# Patient Record
Sex: Female | Born: 1940
Health system: Southern US, Community
[De-identification: ages and names within clinical notes are randomized; demographics above are authoritative.]

## PROBLEM LIST (undated history)

## (undated) DIAGNOSIS — M8580 Other specified disorders of bone density and structure, unspecified site: Secondary | ICD-10-CM

## (undated) DIAGNOSIS — Z923 Personal history of irradiation: Secondary | ICD-10-CM

## (undated) DIAGNOSIS — E119 Type 2 diabetes mellitus without complications: Secondary | ICD-10-CM

## (undated) DIAGNOSIS — E785 Hyperlipidemia, unspecified: Secondary | ICD-10-CM

## (undated) DIAGNOSIS — G4733 Obstructive sleep apnea (adult) (pediatric): Principal | ICD-10-CM

## (undated) DIAGNOSIS — I2510 Atherosclerotic heart disease of native coronary artery without angina pectoris: Secondary | ICD-10-CM

## (undated) DIAGNOSIS — F32A Depression, unspecified: Secondary | ICD-10-CM

## (undated) DIAGNOSIS — I739 Peripheral vascular disease, unspecified: Secondary | ICD-10-CM

## (undated) DIAGNOSIS — F329 Major depressive disorder, single episode, unspecified: Secondary | ICD-10-CM

## (undated) DIAGNOSIS — I10 Essential (primary) hypertension: Secondary | ICD-10-CM

## (undated) DIAGNOSIS — C50919 Malignant neoplasm of unspecified site of unspecified female breast: Secondary | ICD-10-CM

## (undated) DIAGNOSIS — J45909 Unspecified asthma, uncomplicated: Secondary | ICD-10-CM

## (undated) HISTORY — PX: CYST REMOVAL NECK: SHX6281

## (undated) HISTORY — DX: Peripheral vascular disease, unspecified: I73.9

## (undated) HISTORY — DX: Essential (primary) hypertension: I10

## (undated) HISTORY — DX: Obstructive sleep apnea (adult) (pediatric): G47.33

## (undated) HISTORY — PX: BREAST LUMPECTOMY: SHX2

## (undated) HISTORY — DX: Unspecified asthma, uncomplicated: J45.909

## (undated) HISTORY — DX: Major depressive disorder, single episode, unspecified: F32.9

## (undated) HISTORY — DX: Hyperlipidemia, unspecified: E78.5

## (undated) HISTORY — DX: Malignant neoplasm of unspecified site of unspecified female breast: C50.919

## (undated) HISTORY — DX: Type 2 diabetes mellitus without complications: E11.9

## (undated) HISTORY — PX: CHOLECYSTECTOMY: SHX55

## (undated) HISTORY — PX: APPENDECTOMY: SHX54

## (undated) HISTORY — DX: Other specified disorders of bone density and structure, unspecified site: M85.80

## (undated) HISTORY — DX: Atherosclerotic heart disease of native coronary artery without angina pectoris: I25.10

## (undated) HISTORY — DX: Depression, unspecified: F32.A

---

## 1999-08-18 ENCOUNTER — Encounter: Admission: RE | Admit: 1999-08-18 | Discharge: 1999-08-18 | Payer: Self-pay | Admitting: Family Medicine

## 1999-08-18 ENCOUNTER — Encounter: Payer: Self-pay | Admitting: Family Medicine

## 2000-09-27 ENCOUNTER — Encounter: Payer: Self-pay | Admitting: Family Medicine

## 2000-09-27 ENCOUNTER — Encounter: Admission: RE | Admit: 2000-09-27 | Discharge: 2000-09-27 | Payer: Self-pay | Admitting: Family Medicine

## 2001-11-16 ENCOUNTER — Emergency Department (HOSPITAL_COMMUNITY): Admission: EM | Admit: 2001-11-16 | Discharge: 2001-11-16 | Payer: Self-pay | Admitting: Emergency Medicine

## 2002-01-29 ENCOUNTER — Encounter: Admission: RE | Admit: 2002-01-29 | Discharge: 2002-01-29 | Payer: Self-pay | Admitting: Family Medicine

## 2002-02-06 ENCOUNTER — Encounter: Payer: Self-pay | Admitting: Family Medicine

## 2002-02-06 ENCOUNTER — Encounter: Admission: RE | Admit: 2002-02-06 | Discharge: 2002-02-06 | Payer: Self-pay | Admitting: Family Medicine

## 2002-09-06 ENCOUNTER — Encounter: Payer: Self-pay | Admitting: Family Medicine

## 2002-09-06 ENCOUNTER — Encounter: Admission: RE | Admit: 2002-09-06 | Discharge: 2002-09-06 | Payer: Self-pay | Admitting: Family Medicine

## 2002-09-26 ENCOUNTER — Other Ambulatory Visit: Admission: RE | Admit: 2002-09-26 | Discharge: 2002-09-26 | Payer: Self-pay | Admitting: Family Medicine

## 2002-10-03 ENCOUNTER — Encounter (INDEPENDENT_AMBULATORY_CARE_PROVIDER_SITE_OTHER): Payer: Self-pay | Admitting: Specialist

## 2002-10-03 ENCOUNTER — Ambulatory Visit (HOSPITAL_COMMUNITY): Admission: RE | Admit: 2002-10-03 | Discharge: 2002-10-03 | Payer: Self-pay | Admitting: Gastroenterology

## 2003-10-23 ENCOUNTER — Inpatient Hospital Stay (HOSPITAL_COMMUNITY): Admission: EM | Admit: 2003-10-23 | Discharge: 2003-10-29 | Payer: Self-pay | Admitting: Emergency Medicine

## 2004-02-17 ENCOUNTER — Other Ambulatory Visit: Admission: RE | Admit: 2004-02-17 | Discharge: 2004-02-17 | Payer: Self-pay | Admitting: Family Medicine

## 2004-02-26 ENCOUNTER — Encounter: Admission: RE | Admit: 2004-02-26 | Discharge: 2004-02-26 | Payer: Self-pay | Admitting: Family Medicine

## 2005-01-11 ENCOUNTER — Encounter (HOSPITAL_COMMUNITY): Admission: RE | Admit: 2005-01-11 | Discharge: 2005-04-11 | Payer: Self-pay | Admitting: Cardiology

## 2005-02-19 ENCOUNTER — Emergency Department (HOSPITAL_COMMUNITY): Admission: EM | Admit: 2005-02-19 | Discharge: 2005-02-20 | Payer: Self-pay | Admitting: Emergency Medicine

## 2005-06-21 ENCOUNTER — Ambulatory Visit: Payer: Self-pay | Admitting: Internal Medicine

## 2005-08-02 ENCOUNTER — Ambulatory Visit: Payer: Self-pay | Admitting: Internal Medicine

## 2005-08-04 ENCOUNTER — Ambulatory Visit (HOSPITAL_COMMUNITY): Admission: RE | Admit: 2005-08-04 | Discharge: 2005-08-04 | Payer: Self-pay | Admitting: Internal Medicine

## 2005-08-16 ENCOUNTER — Encounter: Admission: RE | Admit: 2005-08-16 | Discharge: 2005-08-16 | Payer: Self-pay | Admitting: General Surgery

## 2005-08-23 ENCOUNTER — Encounter (INDEPENDENT_AMBULATORY_CARE_PROVIDER_SITE_OTHER): Payer: Self-pay | Admitting: Diagnostic Radiology

## 2005-08-23 ENCOUNTER — Encounter: Admission: RE | Admit: 2005-08-23 | Discharge: 2005-08-23 | Payer: Self-pay | Admitting: General Surgery

## 2005-08-23 ENCOUNTER — Encounter (INDEPENDENT_AMBULATORY_CARE_PROVIDER_SITE_OTHER): Payer: Self-pay | Admitting: Specialist

## 2005-09-01 ENCOUNTER — Encounter: Admission: RE | Admit: 2005-09-01 | Discharge: 2005-09-01 | Payer: Self-pay | Admitting: Internal Medicine

## 2005-09-05 ENCOUNTER — Ambulatory Visit (HOSPITAL_COMMUNITY): Admission: RE | Admit: 2005-09-05 | Discharge: 2005-09-05 | Payer: Self-pay | Admitting: General Surgery

## 2005-09-05 ENCOUNTER — Encounter (INDEPENDENT_AMBULATORY_CARE_PROVIDER_SITE_OTHER): Payer: Self-pay | Admitting: *Deleted

## 2005-09-05 ENCOUNTER — Encounter: Admission: RE | Admit: 2005-09-05 | Discharge: 2005-09-05 | Payer: Self-pay | Admitting: General Surgery

## 2005-09-22 ENCOUNTER — Ambulatory Visit: Payer: Self-pay | Admitting: Oncology

## 2005-09-29 ENCOUNTER — Ambulatory Visit: Admission: RE | Admit: 2005-09-29 | Discharge: 2005-12-19 | Payer: Self-pay | Admitting: Radiation Oncology

## 2005-10-10 ENCOUNTER — Encounter: Admission: RE | Admit: 2005-10-10 | Discharge: 2005-10-10 | Payer: Self-pay | Admitting: Radiation Oncology

## 2005-10-17 LAB — COMPREHENSIVE METABOLIC PANEL
ALT: 16 U/L (ref 0–40)
AST: 18 U/L (ref 0–37)
Albumin: 4.3 g/dL (ref 3.5–5.2)
Alkaline Phosphatase: 67 U/L (ref 39–117)
BUN: 17 mg/dL (ref 6–23)
CO2: 27 mEq/L (ref 19–32)
Calcium: 9.5 mg/dL (ref 8.4–10.5)
Chloride: 101 mEq/L (ref 96–112)
Creatinine, Ser: 0.64 mg/dL (ref 0.40–1.20)
Glucose, Bld: 128 mg/dL — ABNORMAL HIGH (ref 70–99)
Potassium: 4.2 mEq/L (ref 3.5–5.3)
Sodium: 137 mEq/L (ref 135–145)
Total Bilirubin: 0.4 mg/dL (ref 0.3–1.2)
Total Protein: 7.3 g/dL (ref 6.0–8.3)

## 2005-10-17 LAB — CBC WITH DIFFERENTIAL/PLATELET
BASO%: 0.6 % (ref 0.0–2.0)
Basophils Absolute: 0 10*3/uL (ref 0.0–0.1)
EOS%: 3.9 % (ref 0.0–7.0)
Eosinophils Absolute: 0.3 10*3/uL (ref 0.0–0.5)
HCT: 37.8 % (ref 34.8–46.6)
HGB: 12.6 g/dL (ref 11.6–15.9)
LYMPH%: 35 % (ref 14.0–48.0)
MCH: 29.6 pg (ref 26.0–34.0)
MCHC: 33.5 g/dL (ref 32.0–36.0)
MCV: 88.5 fL (ref 81.0–101.0)
MONO#: 0.5 10*3/uL (ref 0.1–0.9)
MONO%: 6.9 % (ref 0.0–13.0)
NEUT#: 4.1 10*3/uL (ref 1.5–6.5)
NEUT%: 53.6 % (ref 39.6–76.8)
Platelets: 369 10*3/uL (ref 145–400)
RBC: 4.27 10*6/uL (ref 3.70–5.32)
RDW: 13.4 % (ref 11.3–14.5)
WBC: 7.6 10*3/uL (ref 3.9–10.0)
lymph#: 2.7 10*3/uL (ref 0.9–3.3)

## 2005-11-01 ENCOUNTER — Ambulatory Visit: Payer: Self-pay | Admitting: Internal Medicine

## 2006-01-04 ENCOUNTER — Encounter: Admission: RE | Admit: 2006-01-04 | Discharge: 2006-01-19 | Payer: Self-pay | Admitting: Family Medicine

## 2006-01-16 ENCOUNTER — Ambulatory Visit: Payer: Self-pay | Admitting: Oncology

## 2006-01-27 ENCOUNTER — Encounter: Admission: RE | Admit: 2006-01-27 | Discharge: 2006-01-27 | Payer: Self-pay | Admitting: Sports Medicine

## 2006-08-29 ENCOUNTER — Encounter: Admission: RE | Admit: 2006-08-29 | Discharge: 2006-08-29 | Payer: Self-pay | Admitting: General Surgery

## 2006-09-18 ENCOUNTER — Ambulatory Visit: Payer: Self-pay | Admitting: Oncology

## 2006-09-26 ENCOUNTER — Emergency Department (HOSPITAL_COMMUNITY): Admission: EM | Admit: 2006-09-26 | Discharge: 2006-09-26 | Payer: Self-pay | Admitting: Emergency Medicine

## 2006-10-05 ENCOUNTER — Other Ambulatory Visit: Admission: RE | Admit: 2006-10-05 | Discharge: 2006-10-05 | Payer: Self-pay | Admitting: Family Medicine

## 2007-09-14 ENCOUNTER — Encounter: Admission: RE | Admit: 2007-09-14 | Discharge: 2007-09-14 | Payer: Self-pay | Admitting: Oncology

## 2007-10-15 ENCOUNTER — Ambulatory Visit: Payer: Self-pay | Admitting: Oncology

## 2007-12-10 ENCOUNTER — Emergency Department (HOSPITAL_COMMUNITY): Admission: EM | Admit: 2007-12-10 | Discharge: 2007-12-10 | Payer: Self-pay | Admitting: Emergency Medicine

## 2008-03-18 ENCOUNTER — Encounter (INDEPENDENT_AMBULATORY_CARE_PROVIDER_SITE_OTHER): Payer: Self-pay | Admitting: Gastroenterology

## 2008-03-18 ENCOUNTER — Ambulatory Visit (HOSPITAL_COMMUNITY): Admission: RE | Admit: 2008-03-18 | Discharge: 2008-03-18 | Payer: Self-pay | Admitting: Gastroenterology

## 2008-04-22 ENCOUNTER — Ambulatory Visit: Payer: Self-pay | Admitting: Internal Medicine

## 2008-05-22 DIAGNOSIS — Z853 Personal history of malignant neoplasm of breast: Secondary | ICD-10-CM | POA: Insufficient documentation

## 2008-05-22 DIAGNOSIS — R0600 Dyspnea, unspecified: Secondary | ICD-10-CM | POA: Insufficient documentation

## 2008-05-22 DIAGNOSIS — E785 Hyperlipidemia, unspecified: Secondary | ICD-10-CM | POA: Insufficient documentation

## 2008-05-22 DIAGNOSIS — I10 Essential (primary) hypertension: Secondary | ICD-10-CM | POA: Insufficient documentation

## 2008-05-23 ENCOUNTER — Ambulatory Visit: Payer: Self-pay | Admitting: Internal Medicine

## 2008-10-16 ENCOUNTER — Ambulatory Visit: Payer: Self-pay | Admitting: Oncology

## 2008-10-23 ENCOUNTER — Encounter: Admission: RE | Admit: 2008-10-23 | Discharge: 2008-10-23 | Payer: Self-pay | Admitting: General Surgery

## 2008-12-09 ENCOUNTER — Encounter: Admission: RE | Admit: 2008-12-09 | Discharge: 2008-12-09 | Payer: Self-pay | Admitting: Orthopedic Surgery

## 2008-12-11 ENCOUNTER — Ambulatory Visit (HOSPITAL_BASED_OUTPATIENT_CLINIC_OR_DEPARTMENT_OTHER): Admission: RE | Admit: 2008-12-11 | Discharge: 2008-12-11 | Payer: Self-pay | Admitting: Orthopedic Surgery

## 2009-10-26 ENCOUNTER — Encounter: Admission: RE | Admit: 2009-10-26 | Discharge: 2009-10-26 | Payer: Self-pay | Admitting: Oncology

## 2009-10-26 ENCOUNTER — Ambulatory Visit: Payer: Self-pay | Admitting: Oncology

## 2010-05-30 ENCOUNTER — Encounter: Payer: Self-pay | Admitting: General Surgery

## 2010-05-30 ENCOUNTER — Encounter: Payer: Self-pay | Admitting: Family Medicine

## 2010-06-08 NOTE — Assessment & Plan Note (Signed)
Summary: H1N1/KLW              ]  Flu Vaccine Consent Questions    Do you have a history of severe allergic reactions to this vaccine? no    Any prior history of allergic reactions to egg and/or gelatin? no    Do you have a sensitivity to the preservative Thimersol? no    Do you have a past history of Guillan-Barre Syndrome? no    Do you currently have an acute febrile illness? no    Have you ever had a severe reaction to latex? no    Vaccine information given and explained to patient? yes    Are you currently pregnant? no  H1N1 # 1    Vaccine Type: H1N1 vaccine G code    Site: right deltoid    Mfr: Novartis    Dose: 0.5 ml    Route: IM    Given by: Tammy Scott    Exp. Date: 08/06/2008    Lot #: [id number]    VIS given: 02/06/2009 given May 01, 2008.

## 2010-06-08 NOTE — Assessment & Plan Note (Signed)
Summary: Pulmonary/ re-eval copd   PCP:  Catha Gosselin  Chief Complaint:  Followup COPD.  Pt states that overall her breathing is well.  She states that every once in a while she has some sob.  She states that it comes out of the blu whether she is resting or being active.Marland Kitchen  History of Present Illness: 1 yowf quit smoking 4/05  at wt = 200 with minimal copd by pft's original eval here 2007  May 23, 2008 ov to re-establish on ace with doe x taking the wash out and bending or sob walking fast recovers  after stopping ?? if combivent helps recover quicker (not sure)..  no sign cough or variablility.  Pt denies any significant sore throat, nasal congestion or excess secretions, fever, chills, sweats, unintended wt loss, pleuritic or exertional cp, orthopnea pnd or leg swelling.  Pt also denies any obvious fluctuation in symptoms with weather or environmental change or other alleviating or aggravating factors.     Pt denies any increase in rescue therapy over baseline, denies waking up needing it or having early am exacerbations of coughing/wheezing/ or dyspnea      Updated Prior Medication List: HUMALOG 100 UNIT/ML SOLN (INSULIN LISPRO (HUMAN)) as directed SPIRIVA HANDIHALER 18 MCG CAPS (TIOTROPIUM BROMIDE MONOHYDRATE) inhale contents of 1 capsule daily ALTACE 2.5 MG CAPS (RAMIPRIL) 1 once daily NASACORT AQ 55 MCG/ACT AERS (TRIAMCINOLONE ACETONIDE(NASAL)) 2 sprays each nostril as needed LANTUS 100 UNIT/ML SOLN (INSULIN GLARGINE) as directed ASPIRIN ADULT LOW STRENGTH 81 MG TBEC (ASPIRIN) 1 once daily CRESTOR 40 MG TABS (ROSUVASTATIN CALCIUM) 1 at bedtime ZOLOFT 50 MG TABS (SERTRALINE HCL) 1 at bedtime FEXOFENADINE HCL 60 MG TABS (FEXOFENADINE HCL) 1 once daily as needed ASTELIN 137 MCG/SPRAY SOLN (AZELASTINE HCL) as needed TYLENOL ARTHRITIS PAIN 650 MG CR-TABS (ACETAMINOPHEN) 1 to 2 every 8 hours as needed MUCINEX DM 30-600 MG XR12H-TAB (DEXTROMETHORPHAN-GUAIFENESIN) 1 to 2 by mouth every  12 hours as needed COMBIVENT 103-18 MCG/ACT AERO (IPRATROPIUM-ALBUTEROL) 2 puffs up to 4 times a day as needed  Current Allergies (reviewed today): ! * TETANUS ! * ROBAXIN ! * NIASPAN ! VOLTAREN AMOXICILLIN * TRIMOX ERYTHROMYCIN GLUCOPHAGE * GLUCOTROL  Past Medical History:    Hx of NEOPLASM, MALIGNANT, BREAST, HX OF (ICD-V10.3)    HYPERTENSION (ICD-401.9)    HYPERLIPIDEMIA (ICD-272.4)    C O P D (ICD-496)      -PFTs 08/02/05 FEV1 2.05 ( 97%) ratio 76% diffusing capacity   Past Surgical History:    Right Lumpectomy 1992 Left breast lumpectomy 2007    Back surgery 1974    Cholecystectomy 1981   Family History:    allergies in mother    neg copd or other resp dz  Social History:    Married    Children    Retired    Former smoker.  Quit in April 2005.  Smoked for 40 years at 1 ppd.    Review of Systems  The patient denies anorexia, fever, weight loss, weight gain, vision loss, decreased hearing, hoarseness, chest pain, syncope, peripheral edema, prolonged cough, headaches, hemoptysis, abdominal pain, melena, hematochezia, severe indigestion/heartburn, hematuria, incontinence, muscle weakness, suspicious skin lesions, transient blindness, difficulty walking, depression, unusual weight change, abnormal bleeding, and enlarged lymph nodes.     Vital Signs:  Patient Profile:   70 Years Old Female Height:     66 inches Weight:      220 pounds BP sitting:   108 / 66  (left arm)  Vitals Entered By:  Vernie Murders (May 23, 2008 3:47 PM)                 Physical Exam  wt 220  05/23/2008  Chronically ill wf nad. HEENT mild turbinate edema.  Oropharynx no thrush or excess pnd or cobblestoning.  No JVD or cervical adenopathy. Mild accessory muscle hypertrophy. Trachea midline, nl thryroid. Chest was hyperinflated by percussion with diminished breath sounds and moderate increased exp time without wheeze. Hoover sign positive at mid inspiration. Regular rate and rhythm  without murmur gallop or rub or increase P2.  Abd: no hsm, nl excursion. Ext warm without C,C or E.      Pulmonary Function Test Height (in.): 66 Gender: Female    Impression & Recommendations:  Problem # 1:  C O P D (ICD-496) According to her previous evaluation 2007, she had minimal COPD several years after smoking cessation.  Most of her symptoms improved after switching from ACE inhibitors ARB, but she also had a cough then she doesn't have now.  Nevertheless, we seen many patients with ACE inhibitor side effects that weren't manifested by cough and I would have a low threshold to eliminate these. it is worth noting however that her symptoms are definitely disproportionate to objective findings raising the issue of whether she has a component of vocal cord dysfunction which could be related to ACE inhibitors as reflux or combination of the two and therefore I recommended dietary restrictions.  for now I believe it is reasonable to continue her present regimen and work harder on optimal inhalation technique for both her spiriva and her p.r.n. Combivent, with the caveat that I really don't feel Combivent is an optimal relieving medication if spiriva  is being used consistently and should be replaced on her next refill with  albuterol.    Each maintenance medication was reviewed in detail including most importantly the difference between maintenance and as needed and under what circumstances the prns are to be used. See instructions for specific recommendations  Her updated medication list for this problem includes:    Spiriva Handihaler 18 Mcg Caps (Tiotropium bromide monohydrate) ..... Inhale contents of 1 capsule daily    Combivent 103-18 Mcg/act Aero (Ipratropium-albuterol) .Marland Kitchen... 2 puffs up to 4 times a day as needed  Orders: Est. Patient Level IV (81191)   Medications Added to Medication List This Visit: 1)  Humalog 100 Unit/ml Soln (Insulin lispro (human)) .... As directed 2)   Spiriva Handihaler 18 Mcg Caps (Tiotropium bromide monohydrate) .... Inhale contents of 1 capsule daily 3)  Altace 2.5 Mg Caps (Ramipril) .Marland Kitchen.. 1 once daily 4)  Nasacort Aq 55 Mcg/act Aers (Triamcinolone acetonide(nasal)) .... 2 sprays each nostril as needed 5)  Lantus 100 Unit/ml Soln (Insulin glargine) .... As directed 6)  Aspirin Adult Low Strength 81 Mg Tbec (Aspirin) .Marland Kitchen.. 1 once daily 7)  Crestor 40 Mg Tabs (Rosuvastatin calcium) .Marland Kitchen.. 1 at bedtime 8)  Zoloft 50 Mg Tabs (Sertraline hcl) .Marland Kitchen.. 1 at bedtime 9)  Fexofenadine Hcl 60 Mg Tabs (Fexofenadine hcl) .Marland Kitchen.. 1 once daily as needed 10)  Astelin 137 Mcg/spray Soln (Azelastine hcl) .... As needed 11)  Tylenol Arthritis Pain 650 Mg Cr-tabs (Acetaminophen) .Marland Kitchen.. 1 to 2 every 8 hours as needed 12)  Mucinex Dm 30-600 Mg Xr12h-tab (Dextromethorphan-guaifenesin) .Marland Kitchen.. 1 to 2 by mouth every 12 hours as needed 13)  Combivent 103-18 Mcg/act Aero (Ipratropium-albuterol) .... 2 puffs up to 4 times a day as needed  Complete Medication List: 1)  Humalog  100 Unit/ml Soln (Insulin lispro (human)) .... As directed 2)  Spiriva Handihaler 18 Mcg Caps (Tiotropium bromide monohydrate) .... Inhale contents of 1 capsule daily 3)  Altace 2.5 Mg Caps (Ramipril) .Marland Kitchen.. 1 once daily 4)  Nasacort Aq 55 Mcg/act Aers (Triamcinolone acetonide(nasal)) .... 2 sprays each nostril as needed 5)  Lantus 100 Unit/ml Soln (Insulin glargine) .... As directed 6)  Aspirin Adult Low Strength 81 Mg Tbec (Aspirin) .Marland Kitchen.. 1 once daily 7)  Crestor 40 Mg Tabs (Rosuvastatin calcium) .Marland Kitchen.. 1 at bedtime 8)  Zoloft 50 Mg Tabs (Sertraline hcl) .Marland Kitchen.. 1 at bedtime 9)  Fexofenadine Hcl 60 Mg Tabs (Fexofenadine hcl) .Marland Kitchen.. 1 once daily as needed 10)  Astelin 137 Mcg/spray Soln (Azelastine hcl) .... As needed 11)  Tylenol Arthritis Pain 650 Mg Cr-tabs (Acetaminophen) .Marland Kitchen.. 1 to 2 every 8 hours as needed 12)  Mucinex Dm 30-600 Mg Xr12h-tab (Dextromethorphan-guaifenesin) .Marland Kitchen.. 1 to 2 by mouth every 12 hours as  needed 13)  Combivent 103-18 Mcg/act Aero (Ipratropium-albuterol) .... 2 puffs up to 4 times a day as needed   Patient Instructions: 1)  work on inhaler technique 2)  Please schedule a follow-up appointment in 3 months with PFT's, return sooner if breathing gets worse 3)  GERD (gastroesophageal reflux disease) was discussed. It is a common cause of respiratory symptoms. It commonly presents in the absence of heartburn. GERD can be treated with medication, but also with lifestyle changes including avoidance of late meals, excessive alcohol, smoking cessation, and avoid fatty foods, chocolate, peppermint, colas, red wine, and acidic juices such as orange juice. NO MINT OR MENTHOL PRODUCTS(no cough drops) 4)  USE SUGARLESS CANDY INSTEAD (jolley ranchers)

## 2010-08-14 LAB — BASIC METABOLIC PANEL
BUN: 14 mg/dL (ref 6–23)
CO2: 31 mEq/L (ref 19–32)
Calcium: 9.9 mg/dL (ref 8.4–10.5)
Chloride: 104 mEq/L (ref 96–112)
Creatinine, Ser: 0.6 mg/dL (ref 0.4–1.2)
GFR calc Af Amer: >60 mL/min (ref >60)
GFR calc non Af Amer: >60 mL/min (ref >60)
Glucose, Bld: 119 mg/dL — ABNORMAL HIGH (ref 70–99)
Potassium: 4.5 mEq/L (ref 3.5–5.1)
Sodium: 142 mEq/L (ref 135–145)

## 2010-08-14 LAB — POCT HEMOGLOBIN-HEMACUE
Hemoglobin: 13.7 g/dL (ref 12.0–15.0)
Hemoglobin: 15.4 g/dL — ABNORMAL HIGH (ref 12.0–15.0)

## 2010-08-14 LAB — GLUCOSE, CAPILLARY
Glucose-Capillary: 93 mg/dL (ref 70–99)
Glucose-Capillary: 95 mg/dL (ref 70–99)

## 2010-09-21 NOTE — Op Note (Signed)
NAMELANEAH, LUFT              ACCOUNT NO.:  [id number]   MEDICAL RECORD NO.:  [id number]          PATIENT TYPE:  AMB   LOCATION:  ENDO                         FACILITY:  [facility]   PHYSICIAN:  Petra Kuba, M.D.    DATE OF BIRTH:  1940-09-28   DATE OF PROCEDURE:  03/18/2008  DATE OF DISCHARGE:                               OPERATIVE REPORT   PROCEDURE:  Colonoscopy.   INDICATIONS:  The patient with a history of colon polyps, family history  of sister with colon polyps, due for colonic screening.   CONSENT:  Consent was signed after the risks, benefits, methods,  options, thoroughly discussed multiple times in the past.   MEDICINES USED:  Fentanyl 60 mcg, versed 6 mg.   PROCEDURE:  Rectal inspection was pertinent for external hemorrhoids,  small.  Digital exam was negative.  The video colonoscope was inserted  and fairly easily advanced around the colon to the cecum.  This did  require some abdominal pressure but no position changes.  On insertion  some left-sided diverticula were seen, but no other abnormalities.  The  cecum was identified by the appendiceal orifice and the ileocecal valve.  The prep was adequate.  There was some liquid stool that was washed and  suctioned and some stool balls which could be washed but could not be  suctioned.  The scope was slowly withdrawn.  The cecum and the ascending  were normal.  In the proximal of the hepatic flexure, a small polyp was  seen, snare and electrocautery applied.  The polyp was suctioned through  the scope and collected in the trap.  In the transverse three other tiny  probable hyperplastic-appearing polyps were seen and were cold biopsied  times one or two and put in the same container.  The scope was slowly  withdrawn around the left side of the colon.  A few tiny descending and  proximal sigmoid hyperplastic-appearing polyps were seen and were cold  biopsied times one or two and put in a second container.  The scope was  withdrawn through the distal sigmoid.  Left-sided diverticula were seen.  Another small polyp was seen, snared, electrocautery applied, and this  polyp was removed, suctioned through the scope, and collected in the  trap.  Two other tiny hyperplastic-appearing distal sigmoid polyps were  seen and were cold biopsied and put in the third container.  The scope  was withdrawn back to the rectum.  Anorectal pull-through and  retroflexion confirmed some small hemorrhoids.  The scope was drained,  readvanced a short ways up the left side of the colon.  Air was  suctioned.  The scope removed.  The patient tolerated the procedure  well.  There was no obvious immediate complication.   ENDOSCOPIC FINDINGS:  1. External and internal hemorrhoids.  2. Left-sided scattered diverticula.  3. A few tiny left-sided and transverse hyperplastic-appearing polyps      cold biopsied.  4. Two small polyps in the distal sigmoid and hepatic flexure, status      post snare.  5. Otherwise within normal limits to the cecum.  PLAN:  Await pathology.  Probable repeat colonoscopy in three to five  years.  Follow up p.r.n.  Otherwise return your care to Dr. Clarene Duke for  the customary health care maintenance and screening.           ______________________________  Petra Kuba, M.D.     MEM/MEDQ  D:  03/18/2008  T:  03/18/2008  Job:  [id number]   cc:   Caryn Bee L. Little, M.D.  Fax: [phone]

## 2010-09-21 NOTE — Op Note (Signed)
NAMEBEONCA, GIBB              ACCOUNT NO.:  [id number]   MEDICAL RECORD NO.:  [id number]          PATIENT TYPE:  AMB   LOCATION:  DSC                          FACILITY:  [facility]   PHYSICIAN:  Katy Fitch. Sypher, M.D. DATE OF BIRTH:  05-17-1940   DATE OF PROCEDURE:  12/11/2008  DATE OF DISCHARGE:                               OPERATIVE REPORT   PREOPERATIVE DIAGNOSIS:  Chronic right carpal tunnel syndrome.   POSTOPERATIVE DIAGNOSIS:  Chronic right carpal tunnel syndrome.   OPERATIONS:  Release of right transcarpal ligament.   OPERATIONS:  Katy Fitch. Sypher, MD   ASSISTANT:  Marveen Reeks Dasnoit, PA-C   ANESTHESIA:  General by LMA.   SUPERVISING ANESTHESIOLOGIST:  Germaine Pomfret, MD   INDICATIONS:  Jaren Kearn is a 70 year old woman referred through  the courtesy of Dr. Juluis Rainier of [facility] for  evaluation and management of hand numbness.  Ms. Shibley has multiple  medical problems and multiple drug allergies.   Clinical examination revealed signs of significant carpal tunnel  syndrome.  Electrodiagnostic studies confirmed severe median neuropathy.   Due to failed response to nonoperative measures, she is brought to the  operating room at this time for release of right transverse carpal  ligament.   PROCEDURE:  Aishwarya Shiplett was brought to the operating room and placed  in the supine position upon the operating table.  She was noted to be  LATEX allergic, therefore LATEX allergy precautions were followed  throughout the procedure.   She was interviewed by Dr. Jean Rosenthal in the holding area.  She was noted  to have multiple drug allergies.   After informed consent, she was brought to the operating room at this  time.   Ketzaly Cardella was brought to room #6 at [facility] and placed  in the supine position upon the operating table.   Following induction of general anesthesia by LMA technique, the right  arm was prepped with Betadine  soap and solution, sterilely draped.  A  pneumatic tourniquet was applied to the proximal right brachium.   Following exsanguination of right arm with Esmarch bandage, arterial  tourniquet was inflated to 220 mmHg.   Procedure commenced with a short incision in the line of the ring finger  in the palm.  Subcutaneous tissues were carefully divided to reveal the  palmar fascia.  This was split longitudinally to reveal the common  sensory branch of the median nerve.  These were followed back to the  transverse carpal ligament which was gently isolated from median nerve.  The ligament was then released along its ulnar border extending into the  distal forearm.   This widely opened carpal canal.  No mass or other predicaments were  noted.  Bleeding points along the margin of the released ligament were  cauterized with bipolar current.   The canal was sounded and found to have no sign of mass or other  predicaments other than a fibrotic ulnar bursa.  The wound was then  repaired with intradermal 3-0 Prolene.  A compressive dressing was  applied with a volar plaster splint  maintaining the wrist in 5 degrees  of dorsiflexion.   For aftercare, Ms. Olberding is provided a prescription for Percocet 5 mg 1  p.o. q.4-6 hours p.r.n. pain, 20 tablets without refill.   We will see her back in followup in 1 week or sooner p.r.n. problems at  the office.      Katy Fitch Sypher, M.D.  Electronically Signed     RVS/MEDQ  D:  12/11/2008  T:  12/11/2008  Job:  [id number]   cc:   Juluis Rainier, MD

## 2010-09-24 NOTE — Op Note (Signed)
[name], [name]                      ACCOUNT NO.:  [id number]   MEDICAL RECORD NO.:  [id number]                   PATIENT TYPE:  AMB   LOCATION:  ENDO                                 FACILITY:  [facility]   PHYSICIAN:  [name], M.D.                 DATE OF BIRTH:  1940/12/30   DATE OF PROCEDURE:  DATE OF DISCHARGE:                                 OPERATIVE REPORT   PROCEDURE:  Colonoscopy with polypectomy.   INDICATIONS:  Patient with irritable bowel syndrome with family history of  colon cancer.  Due for colonic screening.  Consent was signed after risks,  benefits, methods, and options were thoroughly discussed in the office in  the past.   PREMEDICATIONS:  Demerol 70 mg, Versed 7 mg.   DESCRIPTION OF PROCEDURE:  Rectal inspection pertinent for external  hemorrhoids, small.  Digital exam was negative.  The regular video  colonoscope was inserted and easily advanced around the colon to the cecum.  This did require some abdominal pressure but no position changes.  On  insertion some left-sided diverticula were seen and probably some left-sided  hyperplastic-appearing polyps.  The cecum  was identified by the appendiceal  orifice and the ileocecal valve.  In fact, the scope was inserted a short  ways into the terminal ileum which was normal.  Photo documentation was  obtained.  The scope was slowly withdrawn.  Prep was adequate.  There was  some liquid stool that required washing and suctioning.  On slow withdrawal  through the colon, the right side of the colon was normal including the  cecum, ascending and majority of the transverse.  There was some left-sided  diverticula, maybe an occasional one in the transverse.  The scope was  withdrawn around the splenic flexure.  Multiple tiny hyperplastic-appearing  polyps were seen and were hot biopsied, put in the same container.  They  were in the descending, sigmoid and rectum.  The scope was retroflexed back  in the  rectum, pertinent for some internal hemorrhoids.  The  scope was  straightened and readvanced a short ways up the left side of the colon.  Air  was suctioned and the scope removed.  The patient tolerated the procedure  well.  There were no obvious immediate complications.   ENDOSCOPIC DIAGNOSES:  1. Internal and external hemorrhoids.  2. Left-sided diverticula.  3. Multiple tiny left-sided polyps, probably hyperplastic, all hot biopsied     in the rectal, sigmoid and descending.  4. Otherwise within normal limits to the terminal ileum.    PLAN:  Happy to see back p.r.n., particularly if IBS symptoms increase.  Await pathology to determine future plans.  Otherwise return to the care of  Dr. Clarene Duke for customary health care maintenance to include yearly rectal  guaiacs.  [name], M.D.    MEM/MEDQ  D:  10/03/2002  T:  10/03/2002  Job:  [id number]   cc:   Caryn Bee L. Little, M.D.  [address]  [place]  [place] [address]  Fax: [phone]

## 2010-09-24 NOTE — Discharge Summary (Signed)
[name]                      ACCOUNT NO.:  [id number]   MEDICAL RECORD NO.:  [id number]                   PATIENT TYPE:  INP   LOCATION:  0359                                 FACILITY:  [facility]   PHYSICIAN:  Sherin Quarry, MD                   DATE OF BIRTH:  29-Aug-1940   DATE OF ADMISSION:  10/22/2003  DATE OF DISCHARGE:  10/29/2003                                 DISCHARGE SUMMARY   HISTORY OF PRESENT ILLNESS:  [name] is a 70 year old lady with a  past history of diabetes and long-standing history of cigarette smoking who  was initially hospitalized on October 22, 2003 after presenting with a several  day history of shortness of breath and a finding of right middle lobe  pneumonia on a chest x-ray done in Dr. Fredirick Maudlin office.  The patient had  been on outpatient antibiotics for 3 days prior to admission, but  unfortunately continued to experience cough, wheezing, and shortness of  breath.  In the emergency room, arterial blood gas showed a pH of 7.45, PCO2  of 39, PO2 of 53.  Bicarbonate was 24.  The decision was therefore made to  admit the patient for further treatment.   PHYSICAL EXAMINATION:  The physical exam at the time of admission was  described by Dr. Virginia Rochester.  VITAL SIGNS:  The temperature was 102, heart rate 102, blood pressure  125/69, respirations 24, O2 saturation of 93% on 2 liters.  HEENT:  Within normal limits.  CHEST:  Bilateral expiratory wheezes with basilar rhonchi.  CARDIOVASCULAR:  Normal S1 and S2 without rubs, murmurs, or gallops.  ABDOMEN:  Benign.  There were normal bowel sounds without masses or  tenderness.  There is no guarding or rebound.  EXTREMITIES:  No evidence of cyanosis or edema.   LABORATORY DATA:  Sodium was 130, potassium 3.9, creatinine 0.7, BUN 12.  Liver profile was normal.  Blood cultures were negative.  CBC revealed a  hemoglobin of 13, and white count was 7300.  Subsequently, BNP  determinations were  done.  These revealed a BNP of 126 and 101.  The finding  on chest x-ray was two focal opacities in the right mid and upper lung  zones, probable patchy air space disease.  It was emphasized that it was  important to follow these findings until they resolved, and that if these  abnormalities did not resolve over subsequent follow up films, that a CT  scan of the chest would probably be indicated.  On admission, the patient  was continued on her usual medicines.  She was started on IV fluids with  normal saline at 80 cc per hour.  Avelox 400 mg IV daily was begun.  Albuterol nebulizer q.6h. and Atrovent q.6h. was initiated.  Solu-Medrol was  begun on October 26, 2003, 60 mg every 12 hours.  Subsequently, the patient was  switched to oral prednisone.  Her coarse was somewhat prolonged, as she had  persistent wheezing and shortness of breath, and for this reason it was felt  prudent to continue to follow her in the hospital.  Not surprisingly, her  blood sugars  tended to increase as a result of the steroid treatment.  She  was monitored on a q.6h. sliding scale insulin regimen, and was also given  20 units of Lantus insulin daily.  On October 29, 2003, the glucose was 222.  On that date, her blood pressure was 152/74.  It appeared that the patient  needed to receive home oxygen, and arrangements were made for this.  On October 29, 2003, the patient was discharged.   DISCHARGE DIAGNOSES:  1. Right middle lobe pneumonia.  2. Severe chronic obstructive pulmonary disease with long-standing history     of tobacco abuse.  3. Diabetes.  4. Hyperlipidemia.  5. Depression.   DISCHARGE MEDICATIONS:  The patient's usual medicines -  1. Allegra 60 mg b.i.d.  2. Lisinopril 10 mg daily.  3. Glyburide 5 mg b.i.d.  4. Avandia 4 mg b.i.d.  5. Zetia 10 mg daily.  6. Lipitor 80 mg daily.  7. Zoloft 50 mg daily.  In addition, she will receive -  1. Avelox 400 mg daily for 5 additional days.  2. Combivent  inhaler three puffs q.i.d.  3. Lantus insulin 20 units daily.  4. Prednisone 40 x3, 30 x3, 20 x3, 10 x3, and then stop.   FOLLOW UP:  1. She will need close follow up with Dr. Fredirick Maudlin office in order to     monitor her blood sugars, and presumably discontinue the insulin when her     blood sugars come down as the prednisone is tapered.  2. Also, as mentioned previously, she will need a follow up chest x-ray     probably in 4-6 weeks to document clearing of the chest x-ray     abnormalities.   CONDITION AT THE TIME OF DISCHARGE:  Fair.                                               Sherin Quarry, MD    SY/MEDQ  D:  10/29/2003  T:  10/29/2003  Job:  [id number]   cc:   Caryn Bee L. Little, M.D.  [address]  [place]  [place] [address]  Fax: [phone]

## 2010-09-24 NOTE — Op Note (Signed)
[name], [name]              ACCOUNT NO.:  [id number]   MEDICAL RECORD NO.:  [id number]          PATIENT TYPE:  AMB   LOCATION:  SDS                          FACILITY:  [facility]   PHYSICIAN:  Rose Phi. Young, M.D.   DATE OF BIRTH:  [birth year]   DATE OF PROCEDURE:  09/05/2005  DATE OF DISCHARGE:                                 OPERATIVE REPORT   PREOPERATIVE DIAGNOSIS:  Ductal carcinoma in situ of the right breast.   POSTOPERATIVE DIAGNOSIS:  Ductal carcinoma in situ of the right breast.   OPERATION:  Right partial mastectomy with needle localization and specimen  mammogram.   SURGEON:  Rose Phi. Maple Hudson, M.D.   ANESTHESIA:  General.   OPERATIVE PROCEDURE:  Prior to coming to the operating room, a localizing  wire had been placed in the right breast at the area of microcalcifications  that had been previously biopsied.   After suitable general anesthesia was induced.  The patient was placed in  the supine position with the arms extended on the arm board.  The right  breast was prepped and draped in usual fashion.   Using the previously placed wire in the lateral part of the right breast as  a guide, a curved incision was then outlined and then the incision and  infiltrated with 0.25% Marcaine.   Incision was made with the wire delivered into the middle of it and a wide  excision of the wire and surrounding tissue was carried out.   Hemostasis obtained with cautery.   Specimen mammogram confirmed the removal of both the clip and the  microcalcifications.   Incision was then closed in two layers with 3-0 Vicryl and subcuticular 4-0  Monocryl and Steri-Strips.  Dressing applied.  The patient was transferred  to the recovery room in satisfactory condition having tolerated the  procedure well.      Rose Phi. Maple Hudson, M.D.  Electronically Signed     PRY/MEDQ  D:  09/05/2005  T:  09/06/2005  Job:  [id number]

## 2010-09-24 NOTE — H&P (Signed)
NAMEHYUN, REALI [facility]                      ACCOUNT NO.:  [id number]   MEDICAL RECORD NO.:  [id number]                   PATIENT TYPE:  EMS   LOCATION:  ED                                   FACILITY:  [facility]   PHYSICIAN:  Hollice Espy, M.D.            DATE OF BIRTH:  [birth year]   DATE OF ADMISSION:  10/22/2003  DATE OF DISCHARGE:                                HISTORY & PHYSICAL   PRIMARY CARE PHYSICIAN:  Dr. Catha Gosselin   CHIEF COMPLAINT:  Shortness of breath.   HISTORY OF PRESENT ILLNESS:  This is a 70 year old white female with past  medical history of diabetes and COPD, who presents with a few days of  shortness of breath and found to have a right middle lobe pneumonia on chest  x-ray done today in her PCP's office.  The patient has been on outpatient  antibiotics x 3 days but has continued to have dyspnea on exertion.  She has  denied any severe wheezing, but she does complain of some mild wheezing and  occasionally productive cough with clear sputum.  Her chest x-ray today done  in the office showed a right middle lobe infiltrate, and she had O2  saturation on room air around 87%.  She was sent over from Dr. Fredirick Maudlin  office to [facility] ER.  In the emergency room, the patient was noted to  have a normal white count with no shift.  Her electrolytes all were stable.  However, her ABG done on room air showed a pH of 7.45, pCO2 of 39, but a pO2  only of 53.  Her bicarb was normal at 24.  The patient was put on oxygen and  received a breathing treatment, and currently she states she is feeling a  little bit better, although still short of breath when ever she moves  around.  She denies any chest pain or palpitations.  She denies any fevers,  chills, nausea, vomiting, dysphagia.  She denies any abdominal pain,  hematuria, dysuria, constipation, or diarrhea.   PAST MEDICAL HISTORY:  1. Tobacco abuse x 40 years.  She just quit last week.  2. She also, according to her  PCP, has diagnosed COPD.  3. She has a history of diabetes.  4. History of hyperlipidemia and depression.   MEDICATIONS:  1. Allegra 60 p.o. b.i.d.  2. Lisinopril 10 p.o. daily.  3. Glyburide 5 p.o. b.i.d.  4. Avandia 4 p.o. b.i.d.  5. Zetia 10 p.o. daily.  6. Lipitor 80 p.o. daily.  7. Zoloft 50 p.o. daily.  8. Naproxen 500 p.o. b.i.d. p.r.n.  9. Robitussin DM 1 tbl p.o. q.8h. p.r.n.   ALLERGIES:  She is allergic to a number of medications, but the ones that  appear to be a true allergy are:  1. IV DYE.  2. VOLTAREN.  3. TETANUS.  4. LATEX.  5. NIASPAN, possibly.   SOCIAL HISTORY:  She denies any  alcohol or drug use.  She recently quit  after a long smoking history.   FAMILY HISTORY:  Noncontributory.   PHYSICAL EXAMINATION:  VITAL SIGNS ON ADMISSION:  Temp 102.2, heart rate  102, blood pressure 125/69, respirations 24, O2 saturation is 93% on 2 L.  GENERAL:  She is alert and oriented x 3 in no apparent distress.  HEENT:  Normocephalic, atraumatic.  She has no carotid bruits.  HEART:  Regular rate and rhythm.  LUNGS:  She has bilateral expiratory wheezing which appears to be mild.  ABDOMEN:  Soft, nontender, obese, nondistended, positive bowel sounds.  EXTREMITIES:  No cyanosis, clubbing, or edema.   LABORATORY DATA:  White count 7.3, H&H 13.0 and 38.1.  MCV 89, platelet  count 310.  She had 72% neutrophils which are normal.  Sodium 130, potassium  3.9, chloride 94, bicarb 27, BUN 12, creatinine 0.7, glucose 125.  Her LFTs  are all within normal limits.  ABG is 7.405, pCO2 39, pO2 53, bicarb 26.  Chest x-ray was sent over from the office and preliminary looks like  evidence of a right middle lobe pneumonia and flattened hemi-diaphragms.   ASSESSMENT AND PLAN:  1. Right middle lobe pneumonia.  We will go ahead and admit her given her     pO2 on her ABGs in the 50s.  She likely has underlying chronic     obstructive pulmonary disease given long smoking history.  She has  no     white count or shift which is likely secondary to her being on 3 days of     outpatient antibiotics.  We will give her nebulizers, O2, IV antibiotics,     and continue to assess.  2. Diabetes.  Sliding-scale plus continued Glyburide and Avandia.  We will     continue lisinopril for renal protection.  3. Questionable chronic obstructive pulmonary disease history.  At this     time, I do not feel the need to start her on steroids but should her     wheezing persist or worsen or her O2 saturations drop, we will go ahead     and start those.  4. History of hyperlipidemia.  On Lipitor and Zetia.  5. Depression.  On Zoloft.                                               Hollice Espy, M.D.    SKK/MEDQ  D:  10/22/2003  T:  10/22/2003  Job:  [id number]   cc:   Caryn Bee L. Little, M.D.  [address]  [place]  [place] [address]  Fax: [phone]

## 2010-10-22 ENCOUNTER — Other Ambulatory Visit: Payer: Self-pay | Admitting: Family Medicine

## 2010-10-22 DIAGNOSIS — Z9889 Other specified postprocedural states: Secondary | ICD-10-CM

## 2010-10-29 ENCOUNTER — Ambulatory Visit
Admission: RE | Admit: 2010-10-29 | Discharge: 2010-10-29 | Disposition: A | Discharge status: Home / Self Care | Payer: BLUE CROSS/BLUE SHIELD | Source: Ambulatory Visit | Attending: Family Medicine | Admitting: Family Medicine

## 2010-10-29 DIAGNOSIS — Z9889 Other specified postprocedural states: Secondary | ICD-10-CM

## 2010-11-04 ENCOUNTER — Encounter (HOSPITAL_BASED_OUTPATIENT_CLINIC_OR_DEPARTMENT_OTHER): Payer: BLUE CROSS/BLUE SHIELD | Admitting: Oncology

## 2010-11-04 DIAGNOSIS — D0590 Unspecified type of carcinoma in situ of unspecified breast: Secondary | ICD-10-CM

## 2011-02-04 LAB — CBC
HCT: 41.8
Hemoglobin: 13.8
MCHC: 33
MCV: 91
Platelets: 377
RBC: 4.6
RDW: 13.4
WBC: 9.7

## 2011-02-04 LAB — DIFFERENTIAL
Basophils Absolute: 0
Basophils Relative: 0
Eosinophils Absolute: 0.3
Eosinophils Relative: 3
Lymphocytes Relative: 22
Lymphs Abs: 2.1
Monocytes Absolute: 0.7
Monocytes Relative: 7
Neutro Abs: 6.6
Neutrophils Relative %: 68

## 2011-02-04 LAB — POCT I-STAT, CHEM 8
BUN: 9
Calcium, Ion: 1.17
Chloride: 103
Creatinine, Ser: 0.7
Glucose, Bld: 165 — ABNORMAL HIGH
HCT: 42
Hemoglobin: 14.3
Potassium: 4.3
Sodium: 141
TCO2: 30

## 2011-07-05 ENCOUNTER — Other Ambulatory Visit: Payer: Self-pay | Admitting: Interventional Cardiology

## 2011-07-07 ENCOUNTER — Inpatient Hospital Stay (HOSPITAL_BASED_OUTPATIENT_CLINIC_OR_DEPARTMENT_OTHER)
Admission: RE | Admit: 2011-07-07 | Discharge: 2011-07-07 | Disposition: A | Discharge status: Home / Self Care | Payer: Medicare Other | Source: Ambulatory Visit | Attending: Interventional Cardiology | Admitting: Interventional Cardiology

## 2011-07-07 ENCOUNTER — Encounter (HOSPITAL_BASED_OUTPATIENT_CLINIC_OR_DEPARTMENT_OTHER)
Admission: RE | Disposition: A | Discharge status: Home / Self Care | Payer: Self-pay | Source: Ambulatory Visit | Attending: Interventional Cardiology

## 2011-07-07 DIAGNOSIS — I209 Angina pectoris, unspecified: Secondary | ICD-10-CM | POA: Insufficient documentation

## 2011-07-07 DIAGNOSIS — I2510 Atherosclerotic heart disease of native coronary artery without angina pectoris: Secondary | ICD-10-CM | POA: Insufficient documentation

## 2011-07-07 LAB — POCT I-STAT GLUCOSE
Glucose, Bld: 223 mg/dL — ABNORMAL HIGH (ref 70–99)
Operator id: 122531

## 2011-07-07 SURGERY — JV LEFT HEART CATHETERIZATION WITH CORONARY ANGIOGRAM
Anesthesia: Moderate Sedation

## 2011-07-07 MED ORDER — METHYLPREDNISOLONE SODIUM SUCC 125 MG IJ SOLR
125.0000 mg | INTRAMUSCULAR | Status: AC
  Administered 2011-07-07: 125 mg via INTRAVENOUS

## 2011-07-07 MED ORDER — SODIUM CHLORIDE 0.9 % IV SOLN: INTRAVENOUS | Status: DC

## 2011-07-07 MED ORDER — DIAZEPAM 5 MG PO TABS
5.0000 mg | ORAL_TABLET | ORAL | Status: AC
  Administered 2011-07-07: 5 mg via ORAL

## 2011-07-07 MED ORDER — DIPHENHYDRAMINE HCL 50 MG/ML IJ SOLN
25.0000 mg | INTRAMUSCULAR | Status: AC
  Administered 2011-07-07: 25 mg via INTRAVENOUS

## 2011-07-07 MED ORDER — SODIUM CHLORIDE 0.9 % IJ SOLN: 3.0000 mL | Freq: Two times a day (BID) | INTRAMUSCULAR | Status: DC

## 2011-07-07 MED ORDER — ACETAMINOPHEN 325 MG PO TABS: 650.0000 mg | ORAL_TABLET | ORAL | Status: DC | PRN

## 2011-07-07 MED ORDER — ASPIRIN 81 MG PO CHEW
324.0000 mg | CHEWABLE_TABLET | ORAL | Status: AC
  Administered 2011-07-07: 324 mg via ORAL

## 2011-07-07 MED ORDER — ASPIRIN EC 325 MG PO TBEC: 325.0000 mg | DELAYED_RELEASE_TABLET | Freq: Every day | ORAL | Status: DC

## 2011-07-07 MED ORDER — SODIUM CHLORIDE 0.9 % IJ SOLN: 3.0000 mL | INTRAMUSCULAR | Status: DC | PRN

## 2011-07-07 MED ORDER — ONDANSETRON HCL 4 MG/2ML IJ SOLN
4.0000 mg | Freq: Four times a day (QID) | INTRAMUSCULAR | Status: DC | PRN
Start: 2011-07-07 — End: 2011-07-07

## 2011-07-07 MED ORDER — SODIUM CHLORIDE 0.9 % IV SOLN: 250.0000 mL | INTRAVENOUS | Status: DC | PRN

## 2011-07-07 MED ORDER — FAMOTIDINE IN NACL 20-0.9 MG/50ML-% IV SOLN
20.0000 mg | INTRAVENOUS | Status: AC
  Administered 2011-07-07: 20 mg via INTRAVENOUS

## 2011-07-07 NOTE — CV Procedure (Signed)
     Diagnostic Cardiac Catheterization Report  [name]  71 y.o.  female 1940/08/28  Procedure Date: 07/07/2011  Referring Physician: Catha Gosselin, M.D. Primary Cardiologist:: Gwynneth Albright, M.D.   PROCEDURE:  Left heart catheterization with selective coronary angiography, left ventriculogram via the right radial approach  INDICATIONS:  Exertional angina in a patient with multiple risk factors for coronary disease including diabetes. The patient has evidence of peripheral vascular disease as well. We did not perform functional testing as the patient was unable to walk on the treadmill and had significant asthma  The risks, benefits, and details of the procedure were explained to the patient.  The patient verbalized understanding and wanted to proceed.  Informed written consent was obtained.  PROCEDURE TECHNIQUE:  After Xylocaine anesthesia a 5 French sheath was placed in the right radial artery with a single anterior needle wall stick.   Coronary angiography was done using multiple catheters.  Left ventriculography was done using a 5 [place] A2 MP catheter. Ultimately both the left and right coronaries were selectively engaged using a 5 [place] #3 EBU guide catheter. Multiple cath exchanges were made for the coronaries could be selectively engaged in adequately visualized.   CONTRAST:  Total of 175 cc.  COMPLICATIONS:  None.    HEMODYNAMICS:  Aortic pressure was 144/60 mmHg; LV pressure was 139/16 mmHg; LVEDP 18 mm mercury.  There was no gradient between the left ventricle and aorta.    ANGIOGRAPHIC DATA:   The left main coronary artery is widely patent.  The left anterior descending artery is transapical with an eccentric 50-70% stenosis in the proximal to mid LAD. A large branching first diagonal contains proximal luminal irregularities but no high-grade obstruction.  The left circumflex artery is gives origin to 3 obtuse marginal branches. The first marginal arises  proximally, is tortuous, and has 50% ostial narrowing. The second and third obtuse marginals arise distally and contain luminal irregularities, are small in distribution, but have no significant stenoses. The distal circumflex supplies collaterals to the distal right coronary.  The right coronary artery is ostial heavy calcification. Luminal irregularities. The vessel is dominant. There is segmental 80% stenosis throughout the distal segment before the origin of the PDA. The left ventricular branch fills with TIMI grade 2 flow due to competition from collaterals that originate from the circumflex the.  LEFT VENTRICULOGRAM:  Left ventricular angiogram was done in the 30 RAO projection and revealed normal left ventricular wall motion and systolic function with an estimated ejection fraction of 60% %.  IMPRESSIONS:  1. Severe distal left coronary disease with borderline proximal to mid LAD disease. The distal right coronary is collateralized from the circumflex.  2. Normal left ventricular function  3. This was an extremely difficult procedure performed from the right radial approach due to vessel tortuosity and inability to control catheter torque. Should intervention becomes necessary in the future, however recommend either the left radial approach, or if at that time her femoral arteries have been revascularized, approaching from the legs   RECOMMENDATION:  1. Imdur 60 mg daily  2. Sublingual l nitroglycerin as needed for angina  3. Will need to have lower extremity and abdominal aortic Doppler performed to investigate peripheral vascular disease.  4. Aggressive risk factor modification.  5. If medical therapy fails, consider PCI of the distal RCA and possibly the LAD.

## 2011-07-07 NOTE — OR Nursing (Signed)
TR Band removed, pressure dressing applied along with velcro splint, discharge instructions reviewed and signed, ambulated in hall without difficulty, transported to daughters car via wheelchair

## 2011-12-20 ENCOUNTER — Other Ambulatory Visit: Payer: Self-pay | Admitting: Gastroenterology

## 2012-03-14 ENCOUNTER — Other Ambulatory Visit: Payer: Self-pay | Admitting: Family Medicine

## 2012-03-14 DIAGNOSIS — Z853 Personal history of malignant neoplasm of breast: Secondary | ICD-10-CM

## 2012-03-14 DIAGNOSIS — Z9889 Other specified postprocedural states: Secondary | ICD-10-CM

## 2012-05-11 DIAGNOSIS — E7800 Pure hypercholesterolemia, unspecified: Secondary | ICD-10-CM | POA: Diagnosis not present

## 2012-05-11 DIAGNOSIS — IMO0001 Reserved for inherently not codable concepts without codable children: Secondary | ICD-10-CM | POA: Diagnosis not present

## 2012-05-11 DIAGNOSIS — I10 Essential (primary) hypertension: Secondary | ICD-10-CM | POA: Diagnosis not present

## 2012-06-05 DIAGNOSIS — I209 Angina pectoris, unspecified: Secondary | ICD-10-CM | POA: Diagnosis not present

## 2012-06-05 DIAGNOSIS — I10 Essential (primary) hypertension: Secondary | ICD-10-CM | POA: Diagnosis not present

## 2012-06-05 DIAGNOSIS — IMO0001 Reserved for inherently not codable concepts without codable children: Secondary | ICD-10-CM | POA: Diagnosis not present

## 2012-06-05 DIAGNOSIS — I2510 Atherosclerotic heart disease of native coronary artery without angina pectoris: Secondary | ICD-10-CM | POA: Diagnosis not present

## 2012-06-05 DIAGNOSIS — J449 Chronic obstructive pulmonary disease, unspecified: Secondary | ICD-10-CM | POA: Diagnosis not present

## 2012-07-19 ENCOUNTER — Ambulatory Visit
Admission: RE | Admit: 2012-07-19 | Discharge: 2012-07-19 | Disposition: A | Discharge status: Home / Self Care | Payer: Medicare Other | Source: Ambulatory Visit | Attending: Family Medicine | Admitting: Family Medicine

## 2012-07-19 DIAGNOSIS — Z853 Personal history of malignant neoplasm of breast: Secondary | ICD-10-CM

## 2012-07-19 DIAGNOSIS — Z9889 Other specified postprocedural states: Secondary | ICD-10-CM

## 2012-09-12 DIAGNOSIS — IMO0001 Reserved for inherently not codable concepts without codable children: Secondary | ICD-10-CM | POA: Diagnosis not present

## 2012-09-20 DIAGNOSIS — R82998 Other abnormal findings in urine: Secondary | ICD-10-CM | POA: Diagnosis not present

## 2012-09-20 DIAGNOSIS — E782 Mixed hyperlipidemia: Secondary | ICD-10-CM | POA: Diagnosis not present

## 2012-09-20 DIAGNOSIS — I10 Essential (primary) hypertension: Secondary | ICD-10-CM | POA: Diagnosis not present

## 2012-09-20 DIAGNOSIS — C50919 Malignant neoplasm of unspecified site of unspecified female breast: Secondary | ICD-10-CM | POA: Diagnosis not present

## 2012-09-20 DIAGNOSIS — Z1331 Encounter for screening for depression: Secondary | ICD-10-CM | POA: Diagnosis not present

## 2012-09-20 DIAGNOSIS — D126 Benign neoplasm of colon, unspecified: Secondary | ICD-10-CM | POA: Diagnosis not present

## 2012-09-20 DIAGNOSIS — Z0000 Encounter for general adult medical examination without abnormal findings: Secondary | ICD-10-CM | POA: Diagnosis not present

## 2012-09-20 DIAGNOSIS — E1159 Type 2 diabetes mellitus with other circulatory complications: Secondary | ICD-10-CM | POA: Diagnosis not present

## 2012-09-20 DIAGNOSIS — M899 Disorder of bone, unspecified: Secondary | ICD-10-CM | POA: Diagnosis not present

## 2012-10-15 ENCOUNTER — Telehealth: Payer: Self-pay | Admitting: Internal Medicine

## 2012-10-15 NOTE — Telephone Encounter (Signed)
Pt last OV 05/23/08  I called pt NA unable to leave VM [facility]

## 2012-10-16 ENCOUNTER — Telehealth: Payer: Self-pay | Admitting: Internal Medicine

## 2012-10-16 NOTE — Telephone Encounter (Signed)
ATC PT NA WCB 

## 2012-10-16 NOTE — Telephone Encounter (Signed)
Patient returning call.  [phone]

## 2012-10-16 NOTE — Telephone Encounter (Signed)
Called spoke with patient who's last ov was 1.15.2010 w/ MW: Patient Instructions:  1) work on inhaler technique  2) Please schedule a follow-up appointment in 3 months with PFT's, return sooner if breathing gets worse  3) GERD (gastroesophageal reflux disease) was discussed. It is a common cause of respiratory symptoms. It commonly presents in the absence of heartburn. GERD can be treated with medication, but also with lifestyle changes including avoidance of late meals, excessive alcohol, smoking cessation, and avoid fatty foods, chocolate, peppermint, colas, red wine, and acidic juices such as orange juice. NO MINT OR MENTHOL PRODUCTS(no cough drops)  4) USE SUGARLESS CANDY INSTEAD (jolley ranchers)    Pt is currently using O2 2lpm qhs and prn during the day thru [facility] and she would like to change to Inogen.  Consult to re-establish w/ MW scheduled for 6.18.14 @ 1145.  Pt aware to arrive 15 mins early and bring insurance cards with her.  Pt is also aware that MW may not be able to do everything she is wanting at [facility] and is okay with this.  Our medical records fax number was given to pt for Inogen paperwork to be sent; pt stated her contact is Glenna Fellows.  Pt denies any respiratory issues at this time and is aware to call for sooner follow up if needed.  Will sign off.

## 2012-10-16 NOTE — Telephone Encounter (Signed)
Error  Dup message °

## 2012-10-24 ENCOUNTER — Encounter: Payer: Self-pay | Admitting: Internal Medicine

## 2012-10-24 ENCOUNTER — Ambulatory Visit (INDEPENDENT_AMBULATORY_CARE_PROVIDER_SITE_OTHER): Payer: Medicare Other | Admitting: Internal Medicine

## 2012-10-24 VITALS — BP 102/60 | HR 90 | Temp 97.4°F | Ht 65.75 in | Wt 230.0 lb

## 2012-10-24 DIAGNOSIS — J9610 Chronic respiratory failure, unspecified whether with hypoxia or hypercapnia: Secondary | ICD-10-CM | POA: Diagnosis not present

## 2012-10-24 DIAGNOSIS — J449 Chronic obstructive pulmonary disease, unspecified: Secondary | ICD-10-CM

## 2012-10-24 DIAGNOSIS — G4734 Idiopathic sleep related nonobstructive alveolar hypoventilation: Secondary | ICD-10-CM | POA: Insufficient documentation

## 2012-10-24 NOTE — Assessment & Plan Note (Signed)
PFTs 08/02/05 FEV1 2.05 ( 97%) ratio 76% diffusing capacity    So she doesn't have copd and never will > mostly what she has is obesity and ? chf historically but not Symptoms not reproducible, variable by hx, and are markedly disproportionate to objective findings and not clear this is a lung problem but pt does appear to have difficult airway management issues.   Adherence is always the initial "prime suspect" and is a multilayered concern that requires a "trust but verify" approach in every patient - starting with knowing how to use medications, especially inhalers, correctly, keeping up with refills and understanding the fundamental difference between maintenance and prns vs those medications only taken for a very short course and then stopped and not refilled.   ? acei effect > strongly consider trial off acei and on ARB

## 2012-10-24 NOTE — Progress Notes (Signed)
Subjective:    Patient ID: [name], female    DOB: 1940/07/09 MRN: [id number]  HPI  89 yowf quit smoking 2005 at wt 200  With no significant copd by PFT's here in 2007 self referred for 02 with ambulation for "copd"  10/24/2012 1st pulmonary ov since 2010 cc persistent x 5 years but somewhat variable doe x one aisle at [employer] off 02 on ACEi, spriva, and combivent which helps a little but can't turn a bad day into a good one. Bad days tend to be assoc with more cough and some hoarseness on ACEi which I  Recommended should be stopped if symptoms worsened in 2010 but they really haven't changed much one way or the other since then.   No obvious daytime variabilty  or cp or chest tightness, subjective wheeze overt sinus or hb symptoms. No unusual exp hx or h/o childhood pna/ asthma or knowledge of premature birth.    Sleeping ok without nocturnal  or early am exacerbation  of respiratory  c/o's or need for noct saba. Also denies any obvious fluctuation of symptoms with weather or environmental changes or other aggravating or alleviating factors except as outlined above   Review of Systems  Constitutional: Negative for fever, chills, diaphoresis, activity change, appetite change, fatigue and unexpected weight change.  HENT: Positive for voice change. Negative for hearing loss, ear pain, nosebleeds, congestion, sore throat, facial swelling, rhinorrhea, sneezing, mouth sores, trouble swallowing, neck pain, neck stiffness, dental problem, postnasal drip, sinus pressure, tinnitus and ear discharge.   Eyes: Negative for photophobia, discharge, itching and visual disturbance.  Respiratory: Positive for cough, chest tightness and shortness of breath. Negative for apnea, choking, wheezing and stridor.   Cardiovascular: Negative for chest pain, palpitations and leg swelling.  Gastrointestinal: Negative for nausea, vomiting, abdominal pain, constipation, blood in stool and abdominal distention.   Genitourinary: Negative for dysuria, urgency, frequency, hematuria, flank pain, decreased urine volume and difficulty urinating.  Musculoskeletal: Negative for myalgias, back pain, joint swelling, arthralgias and gait problem.  Skin: Negative for color change, pallor and rash.  Neurological: Negative for dizziness, tremors, seizures, syncope, speech difficulty, weakness, light-headedness, numbness and headaches.  Hematological: Negative for adenopathy. Does not bruise/bleed easily.  Psychiatric/Behavioral: Negative for confusion, sleep disturbance and agitation. The patient is not nervous/anxious.     Past Medical History:  Hx of NEOPLASM, MALIGNANT, BREAST, HX OF (ICD-V10.3)  HYPERTENSION (ICD-401.9)  HYPERLIPIDEMIA (ICD-272.4)  C O P D (ICD-496)  -PFTs 08/02/05 FEV1 2.05 ( 97%) ratio 76% diffusing capacity   Past Surgical History:  Right Lumpectomy 1992 Left breast lumpectomy 2007  Back surgery 1974  Cholecystectomy 1981   Family History:  allergies in mother  neg copd or other resp dz   Social History:  Married  Children  Retired  Former smoker. Quit in April 2005. Smoked for 40 years at 1 ppd.      Objective:   Physical Exam   amb wf nad  Wt Readings from Last 3 Encounters:  10/24/12 230 lb (104.327 kg)  07/07/11 224 lb (101.606 kg)  07/07/11 224 lb (101.606 kg)    HEENT mild turbinate edema.  Oropharynx no thrush or excess pnd or cobblestoning.  No JVD or cervical adenopathy. Mild accessory muscle hypertrophy. Trachea midline, nl thryroid. Chest was hyperinflated by percussion with diminished breath sounds and moderate increased exp time without wheeze. Hoover sign positive at mid inspiration. Regular rate and rhythm without murmur gallop or rub or increase P2 or edema.  Abd: no hsm, nl excursion. Ext warm without cyanosis or clubbing.     CXR Per pt done in the last month but not in epic    Assessment & Plan:

## 2012-10-24 NOTE — Patient Instructions (Addendum)
Please see patient coordinator before you leave today  to schedule overnight oximetry room air   Please schedule a follow up office visit in 6 weeks, call sooner if needed with pfts Late add strongly consider trial off acei if still on them on f/u ov

## 2012-10-24 NOTE — Assessment & Plan Note (Signed)
-   10/24/2012   Walked RA x 3 laps @ 185 stopped due to sob and legs weak abou the same time but no desats     She does not qualify for amb 02 but probably does desat at hs due to centripetal obesity/ not copd   rec proceed with ono RA

## 2012-11-14 ENCOUNTER — Encounter: Payer: Self-pay | Admitting: Family Medicine

## 2012-11-21 ENCOUNTER — Telehealth: Payer: Self-pay | Admitting: Internal Medicine

## 2012-11-22 NOTE — Telephone Encounter (Signed)
Please advise PCC;s thanks 

## 2012-11-22 NOTE — Telephone Encounter (Signed)
Spoke to pt she does not have innogen she is with [facility]  Staff message sent to [place] about this Tobe Sos

## 2012-11-29 DIAGNOSIS — J449 Chronic obstructive pulmonary disease, unspecified: Secondary | ICD-10-CM | POA: Diagnosis not present

## 2012-11-30 ENCOUNTER — Encounter: Payer: Self-pay | Admitting: Internal Medicine

## 2012-11-30 ENCOUNTER — Other Ambulatory Visit: Payer: Self-pay | Admitting: Internal Medicine

## 2012-11-30 DIAGNOSIS — G4734 Idiopathic sleep related nonobstructive alveolar hypoventilation: Secondary | ICD-10-CM

## 2012-12-07 DIAGNOSIS — E1139 Type 2 diabetes mellitus with other diabetic ophthalmic complication: Secondary | ICD-10-CM | POA: Diagnosis not present

## 2012-12-07 DIAGNOSIS — E11319 Type 2 diabetes mellitus with unspecified diabetic retinopathy without macular edema: Secondary | ICD-10-CM | POA: Diagnosis not present

## 2012-12-12 ENCOUNTER — Ambulatory Visit (INDEPENDENT_AMBULATORY_CARE_PROVIDER_SITE_OTHER): Payer: Medicare Other | Admitting: Internal Medicine

## 2012-12-12 ENCOUNTER — Encounter: Payer: Self-pay | Admitting: Internal Medicine

## 2012-12-12 VITALS — BP 110/60 | HR 83 | Temp 97.9°F | Ht 64.5 in | Wt 229.0 lb

## 2012-12-12 DIAGNOSIS — I10 Essential (primary) hypertension: Secondary | ICD-10-CM | POA: Diagnosis not present

## 2012-12-12 DIAGNOSIS — R0989 Other specified symptoms and signs involving the circulatory and respiratory systems: Secondary | ICD-10-CM | POA: Diagnosis not present

## 2012-12-12 DIAGNOSIS — R0609 Other forms of dyspnea: Secondary | ICD-10-CM

## 2012-12-12 DIAGNOSIS — J9610 Chronic respiratory failure, unspecified whether with hypoxia or hypercapnia: Secondary | ICD-10-CM

## 2012-12-12 DIAGNOSIS — R0600 Dyspnea, unspecified: Secondary | ICD-10-CM

## 2012-12-12 LAB — PULMONARY FUNCTION TEST

## 2012-12-12 MED ORDER — IRBESARTAN 150 MG PO TABS: 150.0000 mg | ORAL_TABLET | Freq: Every day | ORAL | Status: DC

## 2012-12-12 NOTE — Progress Notes (Signed)
Subjective:    Patient ID: [name], female    DOB: 1940/08/07 MRN: [id number]   Brief patient profile:  89 yowf quit smoking 2005 at wt 200  With no significant copd by PFT's here in 2007 self referred for "copd" but pft's nl again 12/12/2012    HPI 10/24/2012 1st pulmonary ov since 2010 cc persistent x 5 years but somewhat variable doe x one aisle at [employer] off 02 on ACEi, spriva, and combivent which helps a little but can't turn a bad day into a good one. Bad days tend to be assoc with more cough and some hoarseness on ACEi which I  Recommended should be stopped if symptoms worsened in 2010 but they really haven't changed much one way or the other since then rec Consider stopping ramapril    12/12/12 f/u ov/ Wert re cough/ sob with nl pfts Chief Complaint  Patient presents with  . Followup with PFT    Pt states that overall her cough and SOB have improved since her last visit. No new co's today.    she is marginally better but still can't do an aisle at [employer] with dry cough and sob and not really much better p combivent  .   No obvious daytime variabilty  or cp or chest tightness, subjective wheeze overt sinus or hb symptoms. No unusual exp hx or h/o childhood pna/ asthma or knowledge of premature birth.    Sleeping ok without nocturnal  or early am exacerbation  of respiratory  c/o's or need for noct saba. Also denies any obvious fluctuation of symptoms with weather or environmental changes or other aggravating or alleviating factors except as outlined above   Current Medications, Allergies, Past Medical History, Past Surgical History, Family History, and Social History were reviewed in [employer] record.  ROS  The following are not active complaints unless bolded sore throat, dysphagia, dental problems, itching, sneezing,  nasal congestion or excess/ purulent secretions, ear ache,   fever, chills, sweats, unintended wt loss, pleuritic or exertional  cp, hemoptysis,  orthopnea pnd or leg swelling, presyncope, palpitations, heartburn, abdominal pain, anorexia, nausea, vomiting, diarrhea  or change in bowel or urinary habits, change in stools or urine, dysuria,hematuria,  rash, arthralgias, visual complaints, headache, numbness weakness or ataxia or problems with walking or coordination,  change in mood/affect or memory.         Past Medical History:  Hx of NEOPLASM, MALIGNANT, BREAST, HX OF (ICD-V10.3)  HYPERTENSION (ICD-401.9)  HYPERLIPIDEMIA (ICD-272.4)     Past Surgical History:  Right Lumpectomy 1992 Left breast lumpectomy 2007  Back surgery 1974  Cholecystectomy 1981   Family History:  allergies in mother  neg copd or other resp dz   Social History:  Married  Children  Retired  Former smoker. Quit in April 2005. Smoked for 40 years at 1 ppd.      Objective:   Physical Exam   amb wf nad  12/12/2012       229   Wt Readings from Last 3 Encounters:  10/24/12 230 lb (104.327 kg)  07/07/11 224 lb (101.606 kg)  07/07/11 224 lb (101.606 kg)    HEENT mild turbinate edema.  Oropharynx no thrush or excess pnd or cobblestoning.  No JVD or cervical adenopathy. Mild accessory muscle hypertrophy. Trachea midline, nl thryroid. Chest was hyperinflated by percussion with diminished breath sounds and moderate increased exp time without wheeze. Hoover sign positive at mid inspiration. Regular rate and rhythm without  murmur gallop or rub or increase P2 or edema.  Abd: no hsm, nl excursion. Ext warm without cyanosis or clubbing.     CXR Per pt done in the last month but not in epic    Assessment & Plan:

## 2012-12-12 NOTE — Progress Notes (Signed)
PFT done. Jennifer Castillo, CMA  

## 2012-12-12 NOTE — Patient Instructions (Addendum)
Stop ramapril and spiriva Start avapro 150 mg one daily and if too strong take half (your new blood pressure pill)  Only use your combivent  as a rescue medication to be used if you can't catch your breath by resting or doing a relaxed purse lip breathing pattern. The less you use it, the better it will work when you need it. Ok to use up to every 4 hours if needed   Work on inhaler technique:  relax and gently blow all the way out then take a nice smooth deep breath back in, triggering the inhaler at same time you start breathing in.  Hold for up to 5 seconds if you can.  Rinse and gargle with water when done    Please schedule a follow up office visit in 6 weeks, call sooner if needed

## 2012-12-14 NOTE — Assessment & Plan Note (Signed)
-   10/24/2012   Walked RA x 3 laps @ 185 stopped due to sob and legs weak about the same time but no desats - ONO RA 11/29/12  Pos desat < 89% x 2 h 21 min > rec 2lpm at hs  11/30/2012 and then repeat on 2lpm  Mostly likely this is on the basis of obesity, reviewed rx for now at 2lpm pending repeat ono on 02

## 2012-12-14 NOTE — Assessment & Plan Note (Signed)
ACE inhibitors are problematic in  pts with airway complaints because  even experienced pulmonologists can't always distinguish ace effects from copd/asthma.  By themselves they don't actually cause a problem, much like oxygen can't by itself start a fire, but they certainly serve as a powerful catalyst or enhancer for any "fire"  or inflammatory process in the upper airway, be it caused by an ET  tube or more commonly reflux (especially in the obese or pts with known GERD or who are on biphoshonates).    In the era of ARB near equivalency until we have a better handle on the reversibility of the airway problem, it just makes sense to avoid ACEI  entirely in the short run and then decide later, having established a level of airway control using a reasonable limited regimen, whether to add back ace but even then being very careful to observe the pt for worsening airway control and number of meds used/ needed to control symptoms.    For now try avapro 150 mg daily in place of ramapril

## 2012-12-14 NOTE — Assessment & Plan Note (Addendum)
-   PFTs 08/02/05 FEV1 2.05 ( 97%) ratio 76%   - PFT's 12/12/2012  wnl with dlco 68 corrects to 78%   When respiratory symptoms begin or become refractory well after a patient reports complete smoking cessation,  Especially when this wasn't the case while they were smoking, a red flag is raised based on the work of Dr Primitivo Gauze which states:  if you quit smoking when your best day FEV1 is still well preserved it is highly unlikely you will progress to severe disease.  That is to say, once the smoking stops,  the symptoms should not suddenly erupt or markedly worsen.  If so, the differential diagnosis should include  obesity/deconditioning,  LPR/Reflux/Aspiration syndromes,  occult CHF, or  especially side effect of medications commonly used in this population.     acei at top of the usual list of suspects > needs trial off x 6 weeks, the only way to assure that acei effects aren't part of her symptom complex  Obesity deconditioning clearly also a concern

## 2012-12-17 DIAGNOSIS — L219 Seborrheic dermatitis, unspecified: Secondary | ICD-10-CM | POA: Diagnosis not present

## 2012-12-17 DIAGNOSIS — L821 Other seborrheic keratosis: Secondary | ICD-10-CM | POA: Diagnosis not present

## 2012-12-18 ENCOUNTER — Encounter: Payer: Self-pay | Admitting: Internal Medicine

## 2013-01-08 ENCOUNTER — Other Ambulatory Visit: Payer: Self-pay | Admitting: *Deleted

## 2013-01-08 ENCOUNTER — Encounter: Payer: Self-pay | Admitting: Internal Medicine

## 2013-01-08 MED ORDER — IRBESARTAN 150 MG PO TABS: 150.0000 mg | ORAL_TABLET | Freq: Every day | ORAL | Status: DC

## 2013-02-01 ENCOUNTER — Encounter: Payer: Self-pay | Admitting: Internal Medicine

## 2013-02-01 ENCOUNTER — Ambulatory Visit (INDEPENDENT_AMBULATORY_CARE_PROVIDER_SITE_OTHER): Payer: Medicare Other | Admitting: Internal Medicine

## 2013-02-01 VITALS — BP 102/58 | HR 66 | Temp 98.5°F | Ht 65.0 in | Wt 231.2 lb

## 2013-02-01 DIAGNOSIS — R0609 Other forms of dyspnea: Secondary | ICD-10-CM | POA: Diagnosis not present

## 2013-02-01 DIAGNOSIS — G4734 Idiopathic sleep related nonobstructive alveolar hypoventilation: Secondary | ICD-10-CM

## 2013-02-01 DIAGNOSIS — I10 Essential (primary) hypertension: Secondary | ICD-10-CM

## 2013-02-01 DIAGNOSIS — R0600 Dyspnea, unspecified: Secondary | ICD-10-CM

## 2013-02-01 DIAGNOSIS — Z23 Encounter for immunization: Secondary | ICD-10-CM | POA: Diagnosis not present

## 2013-02-01 DIAGNOSIS — R0902 Hypoxemia: Secondary | ICD-10-CM | POA: Diagnosis not present

## 2013-02-01 MED ORDER — HYDROCHLOROTHIAZIDE 25 MG PO TABS: 25.0000 mg | ORAL_TABLET | Freq: Every day | ORAL | Status: DC

## 2013-02-01 NOTE — Patient Instructions (Addendum)
Add hctz (hydordiuril) 25 mg daily   Reduce avapro 150 mg one half daily   Please see patient coordinator before you leave today  to schedule ono on 02    If you are satisfied with your treatment plan let your doctor know and he/she can either refill your medications or you can return here when your prescription runs out.     If in any way you are not 100% satisfied,  please tell us.  If 100% better, tell your friends!

## 2013-02-01 NOTE — Progress Notes (Signed)
Subjective:    Patient ID: [name], female    DOB: 1940-06-07 MRN: [id number]   Brief patient profile:  53 yowf quit smoking 2005 at wt 200  With no significant copd by PFT's here in 2007 self referred for "copd" but pft's nl again 12/12/2012    HPI 10/24/2012 1st pulmonary ov since 2010 cc persistent x 5 years but somewhat variable doe x one aisle at [employer] off 02 on ACEi, spriva, and combivent which helps a little but can't turn a bad day into a good one. Bad days tend to be assoc with more cough and some hoarseness on ACEi which I  Recommended should be stopped if symptoms worsened in 2010 but they really haven't changed much one way or the other since then rec Consider stopping ramapril    12/12/12 f/u ov/ Wert re cough/ sob with nl pfts Chief Complaint  Patient presents with  . Followup with PFT    Pt states that overall her cough and SOB have improved since her last visit. No new co's today.    she is marginally better but still can't do an aisle at [employer] with dry cough and sob and not really much better p combivent rec Stop ramapril and spiriva Start avapro 150 mg one daily and if too strong take half (your new blood pressure pill) Only use your combivent    02/01/2013 f/u ov/Wert re: pseudoasthma Chief Complaint  Patient presents with  . Follow-up    Pt states that her cough and SOB continue to improve and her BP is doing well with avapro. She has noticed some swelling in her ankles since BP med change.     rarely need combivent at all, not really limited by sob and able to shop s difficulty   No obvious daytime variabilty  or cp or chest tightness, subjective wheeze overt sinus or hb symptoms. No unusual exp hx or h/o childhood pna/ asthma or knowledge of premature birth.    Sleeping ok without nocturnal  or early am exacerbation  of respiratory  c/o's or need for noct saba. Also denies any obvious fluctuation of symptoms with weather or environmental changes or  other aggravating or alleviating factors except as outlined above   Current Medications, Allergies, Past Medical History, Past Surgical History, Family History, and Social History were reviewed in [employer] record.  ROS  The following are not active complaints unless bolded sore throat, dysphagia, dental problems, itching, sneezing,  nasal congestion or excess/ purulent secretions, ear ache,   fever, chills, sweats, unintended wt loss, pleuritic or exertional cp, hemoptysis,  orthopnea pnd or leg swelling, presyncope, palpitations, heartburn, abdominal pain, anorexia, nausea, vomiting, diarrhea  or change in bowel or urinary habits, change in stools or urine, dysuria,hematuria,  rash, arthralgias, visual complaints, headache, numbness weakness or ataxia or problems with walking or coordination,  change in mood/affect or memory.         Past Medical History:  Hx of NEOPLASM, MALIGNANT, BREAST, HX OF (ICD-V10.3)  HYPERTENSION (ICD-401.9)  HYPERLIPIDEMIA (ICD-272.4)     Past Surgical History:  Right Lumpectomy 1992 Left breast lumpectomy 2007  Back surgery 1974  Cholecystectomy 1981   Family History:  allergies in mother  neg copd or other resp dz   Social History:  Married  Children  Retired  Former smoker. Quit in April 2005. Smoked for 40 years at 1 ppd.      Objective:   Physical Exam   amb  wf nad  12/12/2012       229 > 231 02/01/2013   Wt Readings from Last 3 Encounters:  10/24/12 230 lb (104.327 kg)  07/07/11 224 lb (101.606 kg)  07/07/11 224 lb (101.606 kg)    HEENT: nl dentition, turbinates, and orophanx. Nl external ear canals without cough reflex   NECK :  without JVD/Nodes/TM/ nl carotid upstrokes bilaterally   LUNGS: no acc muscle use, clear to A and P bilaterally without cough on insp or exp maneuvers   CV:  RRR  no s3 or murmur or increase in P2,  Trace edema  ABD:  soft and nontender with nl excursion in the supine position.  No bruits or organomegaly, bowel sounds nl  MS:  warm without deformities, calf tenderness, cyanosis or clubbing  SKIN: warm and dry without lesions    NEURO:  alert, approp, no deficits     CXR Per pt done in the last month but not in epic    Assessment & Plan:

## 2013-02-02 NOTE — Assessment & Plan Note (Signed)
Clearly this is another acei case though not obvious  - she is free of all resp symptoms with only minimal leg swelling since stopping the acei  rec add hctz 25 mg daily to help with fluid retention and reduce avapro to 75 mg daily to prevent orthostasis and f/u with her primary care doctor.

## 2013-02-02 NOTE — Assessment & Plan Note (Signed)
-   10/24/2012   Walked RA x 3 laps @ 185 stopped due to sob and legs weak about the same time but no desats - ONO RA 11/29/12  Pos desat < 89% x 2 h 21 min > rec 2lpm at hs  11/30/2012 and then repeat on 2lpm - repeat 02 sats on 02 02/01/2013 >>>  Most likely secondary to obesity given her nl pft's > if not corrected on 2lpm consider formal sleep study.

## 2013-02-02 NOTE — Assessment & Plan Note (Signed)
-   PFTs 08/02/05 FEV1 2.05 ( 97%) ratio 76%   - 10/24/2012   Walked RA x 3 laps @ 185 stopped due to sob and legs weak about the same time but no desats - PFT's 12/12/2012  wnl with dlco 68 corrects to 78% (no combivent day of pfts) - rec trial off acei 12/12/12  > marked improvement 02/01/13  Marked improvement off acei, no further pulmonary w/u or f/u  needed

## 2013-02-04 DIAGNOSIS — IMO0001 Reserved for inherently not codable concepts without codable children: Secondary | ICD-10-CM | POA: Diagnosis not present

## 2013-02-15 ENCOUNTER — Encounter: Payer: Self-pay | Admitting: Internal Medicine

## 2013-02-15 ENCOUNTER — Telehealth: Payer: Self-pay | Admitting: Internal Medicine

## 2013-02-15 NOTE — Telephone Encounter (Signed)
Will forward to MW as an FYI 

## 2013-02-18 NOTE — Telephone Encounter (Signed)
Spoke with patient; states she had ONO done on Wednesday 02/13/13 night and [facility] picked up ONO equipment on Thursday 02/14/13. I have sent staff message to [facility] regarding this matter and will ask she get [place] the results as soon as possible. Will sign off on message.

## 2013-02-20 ENCOUNTER — Encounter: Payer: Self-pay | Admitting: Internal Medicine

## 2013-02-21 ENCOUNTER — Telehealth: Payer: Self-pay | Admitting: Internal Medicine

## 2013-02-21 DIAGNOSIS — G4734 Idiopathic sleep related nonobstructive alveolar hypoventilation: Secondary | ICD-10-CM

## 2013-02-21 NOTE — Telephone Encounter (Signed)
Per MW ONO on 2lpm was abn- needs to repeat ONO on 4lpm  Pt aware Order was sent to [facility]

## 2013-03-04 ENCOUNTER — Encounter: Payer: Self-pay | Admitting: Internal Medicine

## 2013-03-12 DIAGNOSIS — R350 Frequency of micturition: Secondary | ICD-10-CM | POA: Diagnosis not present

## 2013-03-12 DIAGNOSIS — N61 Mastitis without abscess: Secondary | ICD-10-CM | POA: Diagnosis not present

## 2013-03-13 DIAGNOSIS — N61 Mastitis without abscess: Secondary | ICD-10-CM | POA: Diagnosis not present

## 2013-03-18 ENCOUNTER — Encounter: Payer: Self-pay | Admitting: Internal Medicine

## 2013-03-18 ENCOUNTER — Encounter: Payer: Self-pay | Admitting: Interventional Cardiology

## 2013-03-20 ENCOUNTER — Encounter: Payer: Self-pay | Admitting: Internal Medicine

## 2013-04-02 ENCOUNTER — Encounter: Payer: Self-pay | Admitting: *Deleted

## 2013-04-02 ENCOUNTER — Encounter: Payer: Medicare Other | Attending: Endocrinology | Admitting: *Deleted

## 2013-04-02 VITALS — Ht 65.5 in | Wt 231.0 lb

## 2013-04-02 DIAGNOSIS — Z713 Dietary counseling and surveillance: Secondary | ICD-10-CM | POA: Diagnosis not present

## 2013-04-02 DIAGNOSIS — E119 Type 2 diabetes mellitus without complications: Secondary | ICD-10-CM

## 2013-04-02 NOTE — Patient Instructions (Signed)
Plan:  Aim for 3 Carb Choices per meal (45 grams) +/- 1 either way  Aim for 0-1 Carbs per snack if hungry  Consider reading food labels for Total Carbohydrate and Fat Grams of foods Consider  increasing your activity level by walking for 30 minutes minutes daily as tolerated. Use inhaler before walking Continue checking BG at alternate times per day as directed by MD  Consider taking medication as directed by MD

## 2013-04-02 NOTE — Progress Notes (Signed)
Appt start time: 1200 end time:  1300.  Assessment:  Patient was seen on  11/25/ for individual diabetes education. She was diagnosed with T2DM in 1992. She tests 4-5 times daily. FBS range 100-125, 2hpp range 151-175mg /dl. Takes medication faithfully. Formal education was provided at time of diagnosis. Patient comes today with her husband who is also a patient with a primary question about food. Both partners do the grocery shopping. Kaeleen does the cooking. However they eat out at least 4 times per week.  Current HbA1c: 7.1%  Preferred Learning Style:   No preference indicated   Learning Readiness:   Ready  Change in progress  MEDICATIONS: See List: Lantus, Humalog  DIETARY INTAKE:  24-hr recall:  B (1100 AM): hard boiled eggs, only eats one yoke on rye bread or english muffin  or oatmeal or glucerna Snk ( AM): none  L (1700  PM): (Large meal of the day)  Hero sandwich (subway), salade, wrap from [employer] , elizabeth[employer], [place]..... Snk ( PM): nuts D (2200 PM): left overs (only eats half of lunch), peanut butter on rye Snk ( PM): gold fish, yogurt, diabetic pudding Beverages: diet soda, sugar free peach tea,flavored water. Has 3# dark chocolate candy kisses with each meal for dessert  Usual physical activity: none, exertional SOB, difficulty with legs giving out.   Intervention:  Nutrition counseling provided.  Discussed diabetes disease process and treatment options.  Discussed physiology of diabetes and role of obesity on insulin resistance.  Encouraged moderate weight reduction to improve glucose levels.  Discussed role of medications and diet in glucose control  Provided education on macronutrients on glucose levels.  Provided education on carb counting, importance of regularly scheduled meals/snacks, and meal planning  Discussed effects of physical activity on glucose levels and long-term glucose control.  Recommended 150 minutes of physical activity/week.  Reviewed  patient medications.  Discussed role of medication on blood glucose and possible side effects  Discussed blood glucose monitoring and interpretation.  Discussed recommended target ranges and individual ranges.    Described short-term complications: hyper- and hypo-glycemia.  Discussed causes,symptoms, and treatment options.  Discussed prevention, detection, and treatment of long-term complications.  Discussed the role of prolonged elevated glucose levels on body systems.  Discussed role of stress on blood glucose levels and discussed strategies to manage psychosocial issues.  Discussed recommendations for long-term diabetes self-care.  Established checklist for medical, dental, and emotional self-care.  Plan:  Aim for 3 Carb Choices per meal (45 grams) +/- 1 either way  Aim for 0-1 Carbs per snack if hungry  Consider reading food labels for Total Carbohydrate and Fat Grams of foods Consider  increasing your activity level by walking for 30 minutes minutes daily as tolerated. Use inhaler before walking Continue checking BG at alternate times per day as directed by MD  continue taking medication as directed by MD  Teaching Method Utilized:  Visual Auditory Hands on  Handouts given during visit include: Living Well with Diabetes Carb Counting and Food Label handouts Meal Plan Card Plate method Snack sheet A1c interpretation sheet  Barriers to learning/adherence to lifestyle change: exertional SOB, weight  Diabetes self-care support plan:   [facility] support group  Family  Demonstrated degree of understanding via:  Teach Back   Monitoring/Evaluation:  Dietary intake, exercise, Monitor glucose, and body weight return prn.

## 2013-06-03 ENCOUNTER — Encounter: Payer: Self-pay | Admitting: Interventional Cardiology

## 2013-06-05 ENCOUNTER — Ambulatory Visit: Payer: Medicare Other | Admitting: Interventional Cardiology

## 2013-06-10 ENCOUNTER — Ambulatory Visit (INDEPENDENT_AMBULATORY_CARE_PROVIDER_SITE_OTHER): Payer: Medicare Other | Admitting: Interventional Cardiology

## 2013-06-10 ENCOUNTER — Encounter: Payer: Self-pay | Admitting: Interventional Cardiology

## 2013-06-10 VITALS — BP 138/78 | HR 80 | Resp 12 | Ht 65.5 in | Wt 233.0 lb

## 2013-06-10 DIAGNOSIS — R0609 Other forms of dyspnea: Secondary | ICD-10-CM

## 2013-06-10 DIAGNOSIS — I10 Essential (primary) hypertension: Secondary | ICD-10-CM | POA: Diagnosis not present

## 2013-06-10 DIAGNOSIS — I2510 Atherosclerotic heart disease of native coronary artery without angina pectoris: Secondary | ICD-10-CM

## 2013-06-10 DIAGNOSIS — R0989 Other specified symptoms and signs involving the circulatory and respiratory systems: Secondary | ICD-10-CM

## 2013-06-10 DIAGNOSIS — E785 Hyperlipidemia, unspecified: Secondary | ICD-10-CM

## 2013-06-10 DIAGNOSIS — R0600 Dyspnea, unspecified: Secondary | ICD-10-CM

## 2013-06-10 DIAGNOSIS — I25119 Atherosclerotic heart disease of native coronary artery with unspecified angina pectoris: Secondary | ICD-10-CM | POA: Insufficient documentation

## 2013-06-10 LAB — BRAIN NATRIURETIC PEPTIDE: Pro B Natriuretic peptide (BNP): 41 pg/mL (ref 0.0–100.0)

## 2013-06-10 NOTE — Progress Notes (Signed)
Patient ID: [name], female   DOB: 1940/09/11, 73 y.o.   MRN: [id number]    [address], [address] [place], [place]  [address] Phone: [phone] Fax:  [phone]  Date:  06/10/2013   ID:  [name], DOB 02/05/1941, MRN [id number]  PCP:  Gennette Pac, MD   ASSESSMENT:  1. Coronary artery disease, with distal LAD and right coronary disease, 2. Peripheral vascular disease 3. Dyspnea on exertion. 4. Hypertension 5. Right greater than left lower extremity, secondary to venous insufficiency. 6. COPD, apparently not a significant clinical problem according to Dr. were  PLAN:  1. BNP. If elevated may need diuretic therapy as diastolic heart failure could be a significant component of dyspnea.  2. Clinical followup in one year 3. Consider lower extremity compression stockings   SUBJECTIVE: [name] is a 73 y.o. female who has dyspnea when she bends over and dyspnea on exertion. She was told by Dr. Melvyn Novas that she does not have COPD. He stopped therapy for COPD and her breathing improved (according to the patient). She denies angina. She is known to have a totally occluded distal right coronary with left to right collaterals.   Wt Readings from Last 3 Encounters:  06/10/13 233 lb (105.688 kg)  04/02/13 231 lb (104.781 kg)  02/01/13 231 lb 3.2 oz (104.872 kg)     Past Medical History  Diagnosis Date  . Hyperlipidemia   . COPD (chronic obstructive pulmonary disease)   . Asthma   . DM (diabetes mellitus)   . Osteopenia   . Breast cancer   . Hypertension   . Depression   . PVD (peripheral vascular disease)   . Coronary atherosclerosis     Current Outpatient Prescriptions  Medication Sig Dispense Refill  . albuterol-ipratropium (COMBIVENT) 18-103 MCG/ACT inhaler Inhale 2 puffs into the lungs every 6 (six) hours as needed for wheezing.      Marland Kitchen aspirin 81 MG tablet Take 81 mg by mouth daily.      . cholecalciferol (VITAMIN D) 1000 UNITS tablet  Take 2,000 Units by mouth daily.      . fexofenadine (ALLEGRA) 180 MG tablet Take 180 mg by mouth daily.      . hydrochlorothiazide (HYDRODIURIL) 25 MG tablet Take 25 mg by mouth daily. As needed      . insulin glargine (LANTUS) 100 UNIT/ML injection 25 units Q AM, 35 units Q PM      . insulin lispro (HUMALOG) 100 UNIT/ML injection Inject 25-30 Units into the skin 3 (three) times daily before meals.      . irbesartan (AVAPRO) 75 MG tablet Take 75 mg by mouth daily.      . isosorbide mononitrate (IMDUR) 60 MG 24 hr tablet Take 60 mg by mouth daily.      . nitroGLYCERIN (NITROSTAT) 0.4 MG SL tablet Place 0.4 mg under the tongue every 5 (five) minutes as needed for chest pain.      . rosuvastatin (CRESTOR) 40 MG tablet Take 40 mg by mouth daily.      . sertraline (ZOLOFT) 100 MG tablet Take 100 mg by mouth daily.       No current facility-administered medications for this visit.    Allergies:    Allergies  Allergen Reactions  . Amoxicillin     REACTION: gi upset  . Diclofenac Sodium     REACTION: rash  . Erythromycin     REACTION: gi upset  . Glipizide  REACTION: gi upset  . Metformin     REACTION: gi upset  . Methocarbamol     REACTION: tachycardia  . Niacin     REACTION: rash and sob  . Tetanus Toxoid     REACTION: swelling    Social History:  The patient  reports that she quit smoking about 10 years ago. Her smoking use included Cigarettes. She has a 40 pack-year smoking history. She has never used smokeless tobacco. She reports that she does not drink alcohol or use illicit drugs.   ROS:  Please see the history of present illness.   Denies orthopnea. Right greater than left lower extremity swelling. Significant varicose veins right lower extremity.   All other systems reviewed and negative.   OBJECTIVE: VS:  Ht 5' 5.5" (1.664 m)  Wt 233 lb (105.688 kg)  BMI 38.17 kg/m2 Well nourished, well developed, in no acute distress, elderly HEENT: normal Neck: JVD flat. Carotid  bruit absent  Cardiac:  normal S1, S2; RRR; no murmur Lungs:  clear to auscultation bilaterally, no wheezing, rhonchi or rales Abd: soft, nontender, no hepatomegaly Ext: Edema 2+ right with evidence of venous lakes and significant varicosities greater than left. 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 Watt Climes, recheck in 3-5 years    Exogenous obesity   Arthritis   Migraine headaches   Vertigo   CAD with high grade occluded distal RCA supplied by Lt to Rt collaterals, followed by cardiologist Dr. [name]   09/20/12 [facility] BP and diabetes form given     Signed, Illene Labrador III, MD 06/10/2013 2:26 PM

## 2013-06-10 NOTE — Patient Instructions (Signed)
Your physician recommends that you continue on your current medications as directed. Please refer to the Current Medication list given to you today.  Lab today: Bnp  Your physician wants you to follow-up in: 1 year You will receive a reminder letter in the mail two months in advance. If you don't receive a letter, please call our office to schedule the follow-up appointment.

## 2013-06-12 ENCOUNTER — Telehealth: Payer: Self-pay

## 2013-06-12 NOTE — Telephone Encounter (Signed)
Message copied by Lamar Laundry on Wed Jun 12, 2013  3:28 PM ------      Message from: Daneen Schick      Created: Tue Jun 11, 2013  9:13 AM       Shortness of breath is not related to heart. ------

## 2013-06-12 NOTE — Telephone Encounter (Signed)
pt given lab results.Shortness of breath is not related to heart.pt sts that she has an Rx for Hctz that she takes prn.pt sts that she does have some edma in her lower legs and will take 1 tablet hctz 25mg  tday.pt adv to weigh daily and keep track of her weight.pt verbalized understanding.

## 2013-06-12 NOTE — Telephone Encounter (Signed)
Message copied by Lamar Laundry on Wed Jun 12, 2013  3:32 PM ------      Message from: Daneen Schick      Created: Tue Jun 11, 2013  9:13 AM       Shortness of breath is not related to heart. ------

## 2013-06-18 ENCOUNTER — Emergency Department (HOSPITAL_COMMUNITY): Payer: Medicare Other

## 2013-06-18 ENCOUNTER — Emergency Department (HOSPITAL_COMMUNITY)
Admission: EM | Admit: 2013-06-18 | Discharge: 2013-06-19 | Disposition: A | Discharge status: Home / Self Care | Payer: Medicare Other | Attending: Emergency Medicine | Admitting: Emergency Medicine

## 2013-06-18 ENCOUNTER — Encounter (HOSPITAL_COMMUNITY): Payer: Self-pay | Admitting: Emergency Medicine

## 2013-06-18 DIAGNOSIS — I739 Peripheral vascular disease, unspecified: Secondary | ICD-10-CM | POA: Insufficient documentation

## 2013-06-18 DIAGNOSIS — E119 Type 2 diabetes mellitus without complications: Secondary | ICD-10-CM | POA: Insufficient documentation

## 2013-06-18 DIAGNOSIS — E785 Hyperlipidemia, unspecified: Secondary | ICD-10-CM | POA: Diagnosis not present

## 2013-06-18 DIAGNOSIS — J449 Chronic obstructive pulmonary disease, unspecified: Secondary | ICD-10-CM | POA: Diagnosis not present

## 2013-06-18 DIAGNOSIS — M949 Disorder of cartilage, unspecified: Secondary | ICD-10-CM

## 2013-06-18 DIAGNOSIS — S8990XA Unspecified injury of unspecified lower leg, initial encounter: Secondary | ICD-10-CM | POA: Diagnosis not present

## 2013-06-18 DIAGNOSIS — Z794 Long term (current) use of insulin: Secondary | ICD-10-CM | POA: Insufficient documentation

## 2013-06-18 DIAGNOSIS — M25569 Pain in unspecified knee: Secondary | ICD-10-CM | POA: Diagnosis not present

## 2013-06-18 DIAGNOSIS — Z87891 Personal history of nicotine dependence: Secondary | ICD-10-CM | POA: Diagnosis not present

## 2013-06-18 DIAGNOSIS — F329 Major depressive disorder, single episode, unspecified: Secondary | ICD-10-CM | POA: Insufficient documentation

## 2013-06-18 DIAGNOSIS — M79609 Pain in unspecified limb: Secondary | ICD-10-CM | POA: Diagnosis not present

## 2013-06-18 DIAGNOSIS — Z880 Allergy status to penicillin: Secondary | ICD-10-CM | POA: Insufficient documentation

## 2013-06-18 DIAGNOSIS — S8012XA Contusion of left lower leg, initial encounter: Secondary | ICD-10-CM

## 2013-06-18 DIAGNOSIS — S99919A Unspecified injury of unspecified ankle, initial encounter: Secondary | ICD-10-CM | POA: Diagnosis not present

## 2013-06-18 DIAGNOSIS — Z79899 Other long term (current) drug therapy: Secondary | ICD-10-CM | POA: Diagnosis not present

## 2013-06-18 DIAGNOSIS — I2510 Atherosclerotic heart disease of native coronary artery without angina pectoris: Secondary | ICD-10-CM | POA: Diagnosis not present

## 2013-06-18 DIAGNOSIS — J4489 Other specified chronic obstructive pulmonary disease: Secondary | ICD-10-CM | POA: Insufficient documentation

## 2013-06-18 DIAGNOSIS — F3289 Other specified depressive episodes: Secondary | ICD-10-CM | POA: Insufficient documentation

## 2013-06-18 DIAGNOSIS — S8010XA Contusion of unspecified lower leg, initial encounter: Secondary | ICD-10-CM | POA: Insufficient documentation

## 2013-06-18 DIAGNOSIS — I10 Essential (primary) hypertension: Secondary | ICD-10-CM | POA: Diagnosis not present

## 2013-06-18 DIAGNOSIS — Z7982 Long term (current) use of aspirin: Secondary | ICD-10-CM | POA: Insufficient documentation

## 2013-06-18 DIAGNOSIS — S99929A Unspecified injury of unspecified foot, initial encounter: Secondary | ICD-10-CM | POA: Diagnosis not present

## 2013-06-18 DIAGNOSIS — M899 Disorder of bone, unspecified: Secondary | ICD-10-CM | POA: Diagnosis not present

## 2013-06-18 DIAGNOSIS — Y92009 Unspecified place in unspecified non-institutional (private) residence as the place of occurrence of the external cause: Secondary | ICD-10-CM | POA: Insufficient documentation

## 2013-06-18 DIAGNOSIS — W2209XA Striking against other stationary object, initial encounter: Secondary | ICD-10-CM | POA: Insufficient documentation

## 2013-06-18 DIAGNOSIS — Z853 Personal history of malignant neoplasm of breast: Secondary | ICD-10-CM | POA: Insufficient documentation

## 2013-06-18 DIAGNOSIS — Y9389 Activity, other specified: Secondary | ICD-10-CM | POA: Insufficient documentation

## 2013-06-18 NOTE — ED Notes (Signed)
Pt. accidentally hit by a cart at home last week at her right foot/shin , presents with pain at right foot and entire right leg onset last night . Mild swelling/bruise at right lower shin .

## 2013-06-18 NOTE — ED Notes (Signed)
Patient transported to X-ray 

## 2013-06-18 NOTE — ED Provider Notes (Signed)
CSN: [id number]     Arrival date & time 06/18/13  2123 History  This chart was scribed for Delos Haring, PA-C, working with Tanna Furry, MD by Maree Erie, ED Scribe. This patient was seen in room TR10C/TR10C and the patient's care was started at 11:44 PM.    Chief Complaint  Patient presents with  . Leg Pain     Patient is a 73 y.o. female presenting with leg pain. The history is provided by the patient. No language interpreter was used.  Leg Pain   HPI Comments: Phyliss Sweeley is a 73 y.o. female who presents to the Emergency Department complaining of waxing and waning right foot pain that began a week ago. She states that the pain began after she was accidentally hit by a shopping cart. She states that the pain was improving until last night when the pain severely worsened until about an hour ago when it significantly improved. She states that the pain radiates all the way up to the right hip. She states the pain is worsened by moving. She reports associated swelling this morning that has improved throughout the day. She states that she recently had an hour long car ride that could have exacerbated the pain. She denies any pain currently, including calf pain. She has been taking Aleve throughout the week with significant relief of pain except for today, in which it provided no relief.  No SOB, confusion, fevers, chills, bowel or urine incontinence.   Past Medical History  Diagnosis Date  . Hyperlipidemia   . COPD (chronic obstructive pulmonary disease)   . Asthma   . DM (diabetes mellitus)   . Osteopenia   . Breast cancer   . Hypertension   . Depression   . PVD (peripheral vascular disease)   . Coronary atherosclerosis    Past Surgical History  Procedure Laterality Date  . Appendectomy    . Cholecystectomy     Family History  Problem Relation Age of Onset  . Heart disease Mother   . Heart disease Father    History  Substance Use Topics  . Smoking status: Former  Smoker -- 1.00 packs/day for 40 years    Types: Cigarettes    Quit date: 05/10/2003  . Smokeless tobacco: Never Used  . Alcohol Use: No   OB History   Grav Para Term Preterm Abortions TAB SAB Ect Mult Living                 Review of Systems  Cardiovascular: Positive for leg swelling.  Musculoskeletal: Positive for myalgias.  All other systems reviewed and are negative.    Allergies  Amoxicillin; Diclofenac sodium; Erythromycin; Glipizide; Metformin; Methocarbamol; Niacin; and Tetanus toxoid  Home Medications   Current Outpatient Rx  Name  Route  Sig  Dispense  Refill  . albuterol-ipratropium (COMBIVENT) 18-103 MCG/ACT inhaler   Inhalation   Inhale 2 puffs into the lungs every 6 (six) hours as needed for wheezing.         Marland Kitchen aspirin 81 MG tablet   Oral   Take 81 mg by mouth daily.         Marland Kitchen azelastine (ASTELIN) 137 MCG/SPRAY nasal spray               . cholecalciferol (VITAMIN D) 1000 UNITS tablet   Oral   Take 2,000 Units by mouth daily.         . fexofenadine (ALLEGRA) 180 MG tablet   Oral   Take 180  mg by mouth daily as needed for allergies.          Marland Kitchen insulin glargine (LANTUS) 100 UNIT/ML injection      25 units Q AM, 35 units Q PM         . insulin lispro (HUMALOG) 100 UNIT/ML injection   Subcutaneous   Inject 25-30 Units into the skin 3 (three) times daily before meals. Dose adjusted by carb count and blood sugar levels         . irbesartan (AVAPRO) 75 MG tablet   Oral   Take 75 mg by mouth daily.         . isosorbide mononitrate (IMDUR) 60 MG 24 hr tablet   Oral   Take 60 mg by mouth daily.         . Naproxen Sodium (ALEVE PO)   Oral   Take 1-2 tablets by mouth every 12 (twelve) hours as needed (for leg pain).         . rosuvastatin (CRESTOR) 40 MG tablet   Oral   Take 40 mg by mouth daily.         . sertraline (ZOLOFT) 100 MG tablet   Oral   Take 100 mg by mouth daily.         . hydrochlorothiazide (HYDRODIURIL)  25 MG tablet   Oral   Take 25 mg by mouth daily as needed (swelling). As needed          Triage Vitals: BP 149/64  Pulse 92  Temp(Src) 98.6 F (37 C) (Oral)  Resp 20  SpO2 95%  Physical Exam  Nursing note and vitals reviewed. Constitutional: She is oriented to person, place, and time. She appears well-developed and well-nourished. No distress.  HENT:  Head: Normocephalic and atraumatic.  Eyes: EOM are normal.  Neck: Neck supple. No tracheal deviation present.  Cardiovascular: Normal rate.   Pulmonary/Chest: Effort normal. No respiratory distress.  Musculoskeletal: Normal range of motion.  Right achilles tendon intact. Sensation intact distally. Able to ambulate.  Neurological: She is alert and oriented to person, place, and time. She has normal strength. No sensory deficit. Coordination and gait normal.  Skin: Skin is warm and dry.  Psychiatric: She has a normal mood and affect. Her behavior is normal.    ED Course  Procedures (including critical care time)  DIAGNOSTIC STUDIES: Oxygen Saturation is 95% on room air, adequate by my interpretation.    COORDINATION OF CARE: 11:47 PM -Updated patient on radiology results. Will have patient follow up in morning for US doppler exam of right leg. DVT very unlikely. Patient verbalizes understanding and agrees with treatment plan. Pt advised to elevate legs and to avoid sitting for prolonged period of time. Also recommend compression stalkings.  Labs Review Labs Reviewed - No data to display Imaging Review Dg Tibia/fibula Right  06/18/2013   CLINICAL DATA:  Trauma and of the right lower leg  EXAM: RIGHT TIBIA AND FIBULA - 2 VIEW  COMPARISON:  None.  FINDINGS: There is no evidence of fracture or dislocation. The soft tissues are normal. Plantar calcaneal spur is noted.  IMPRESSION: No acute fracture or dislocation.   Electronically Signed   By: Abelardo Diesel M.D.   On: 06/18/2013 23:20    EKG Interpretation   None       MDM    Final diagnoses:  Contusion of left lower leg    73 y.o.Sonny Masters Talamante's evaluation in the Emergency Department is complete. It has been determined  that no acute conditions requiring further emergency intervention are present at this time. The patient/guardian have been advised of the diagnosis and plan. We have discussed signs and symptoms that warrant return to the ED, such as changes or worsening in symptoms.  Vital signs are stable at discharge. Filed Vitals:   06/18/13 2132  BP: 149/64  Pulse: 92  Temp: 98.6 F (37 C)  Resp: 20    Patient/guardian has voiced understanding and agreed to follow-up with the PCP or specialist.  I personally performed the services described in this documentation, which was scribed in my presence. The recorded information has been reviewed and is accurate.   Linus Mako, PA-C 06/19/13 0002

## 2013-06-19 ENCOUNTER — Other Ambulatory Visit (HOSPITAL_COMMUNITY): Payer: Self-pay | Admitting: Emergency Medicine

## 2013-06-19 ENCOUNTER — Ambulatory Visit (HOSPITAL_COMMUNITY)
Admission: RE | Admit: 2013-06-19 | Discharge: 2013-06-19 | Disposition: A | Discharge status: Home / Self Care | Payer: Medicare Other | Source: Ambulatory Visit | Attending: Emergency Medicine | Admitting: Emergency Medicine

## 2013-06-19 DIAGNOSIS — Z86718 Personal history of other venous thrombosis and embolism: Secondary | ICD-10-CM | POA: Diagnosis not present

## 2013-06-19 DIAGNOSIS — M79606 Pain in leg, unspecified: Secondary | ICD-10-CM

## 2013-06-19 DIAGNOSIS — M79609 Pain in unspecified limb: Secondary | ICD-10-CM | POA: Diagnosis not present

## 2013-06-19 NOTE — Progress Notes (Signed)
*  PRELIMINARY RESULTS* Vascular Ultrasound Right lower extremity venous duplex has been completed.  Preliminary findings: No evidence of DVT    Landry Mellow, RDMS, RVT  06/19/2013, 10:48 AM

## 2013-06-19 NOTE — Discharge Instructions (Signed)

## 2013-06-19 NOTE — ED Provider Notes (Signed)
Medical screening examination/treatment/procedure(s) were performed by non-physician practitioner and as supervising physician I was immediately available for consultation/collaboration.  EKG Interpretation   None         Tanna Furry, MD 06/19/13 1700

## 2013-08-02 ENCOUNTER — Ambulatory Visit (HOSPITAL_COMMUNITY): Payer: Medicare Other | Attending: Interventional Cardiology | Admitting: Cardiology

## 2013-08-02 ENCOUNTER — Encounter (HOSPITAL_COMMUNITY): Payer: Self-pay | Admitting: Interventional Cardiology

## 2013-08-02 DIAGNOSIS — I739 Peripheral vascular disease, unspecified: Secondary | ICD-10-CM | POA: Diagnosis not present

## 2013-08-02 NOTE — Progress Notes (Signed)
Lower extremity doppler and duplex completed 

## 2013-08-06 ENCOUNTER — Telehealth: Payer: Self-pay

## 2013-08-06 NOTE — Telephone Encounter (Signed)
pt given results of lower ex artrerial dup.Mildly abnormal. Med therapy with walking encouraged.pt verbalized understanding.

## 2013-08-06 NOTE — Telephone Encounter (Signed)
Message copied by Lamar Laundry on Tue Aug 06, 2013 10:23 AM ------      Message from: Daneen Schick      Created: Sun Aug 04, 2013  8:07 PM       Mildly abnormal. Med therapy with walking encouraged ------

## 2013-08-08 ENCOUNTER — Other Ambulatory Visit (HOSPITAL_COMMUNITY): Payer: Self-pay | Admitting: Cardiology

## 2013-08-08 DIAGNOSIS — I739 Peripheral vascular disease, unspecified: Secondary | ICD-10-CM

## 2013-08-08 DIAGNOSIS — L98499 Non-pressure chronic ulcer of skin of other sites with unspecified severity: Principal | ICD-10-CM

## 2013-08-12 DIAGNOSIS — IMO0001 Reserved for inherently not codable concepts without codable children: Secondary | ICD-10-CM | POA: Diagnosis not present

## 2013-08-16 ENCOUNTER — Ambulatory Visit (HOSPITAL_COMMUNITY): Payer: Medicare Other | Attending: Interventional Cardiology | Admitting: *Deleted

## 2013-08-16 DIAGNOSIS — I739 Peripheral vascular disease, unspecified: Secondary | ICD-10-CM | POA: Insufficient documentation

## 2013-08-16 DIAGNOSIS — L98499 Non-pressure chronic ulcer of skin of other sites with unspecified severity: Principal | ICD-10-CM | POA: Insufficient documentation

## 2013-08-16 DIAGNOSIS — I700 Atherosclerosis of aorta: Secondary | ICD-10-CM

## 2013-08-16 NOTE — Progress Notes (Signed)
Abdominal aorta duplex complete 

## 2013-09-18 ENCOUNTER — Other Ambulatory Visit: Payer: Self-pay

## 2013-09-18 DIAGNOSIS — Z1231 Encounter for screening mammogram for malignant neoplasm of breast: Secondary | ICD-10-CM

## 2013-09-18 DIAGNOSIS — Z9889 Other specified postprocedural states: Secondary | ICD-10-CM

## 2013-09-18 DIAGNOSIS — Z853 Personal history of malignant neoplasm of breast: Secondary | ICD-10-CM

## 2013-09-24 ENCOUNTER — Telehealth: Payer: Self-pay | Admitting: Interventional Cardiology

## 2013-09-24 NOTE — Telephone Encounter (Signed)
New problem    Pt want to know results of arterial test.

## 2013-09-26 DIAGNOSIS — R82998 Other abnormal findings in urine: Secondary | ICD-10-CM | POA: Diagnosis not present

## 2013-09-26 DIAGNOSIS — Z1331 Encounter for screening for depression: Secondary | ICD-10-CM | POA: Diagnosis not present

## 2013-09-26 DIAGNOSIS — I10 Essential (primary) hypertension: Secondary | ICD-10-CM | POA: Diagnosis not present

## 2013-09-26 DIAGNOSIS — E1159 Type 2 diabetes mellitus with other circulatory complications: Secondary | ICD-10-CM | POA: Diagnosis not present

## 2013-09-26 DIAGNOSIS — M949 Disorder of cartilage, unspecified: Secondary | ICD-10-CM | POA: Diagnosis not present

## 2013-09-26 DIAGNOSIS — Z0000 Encounter for general adult medical examination without abnormal findings: Secondary | ICD-10-CM | POA: Diagnosis not present

## 2013-09-26 DIAGNOSIS — C50919 Malignant neoplasm of unspecified site of unspecified female breast: Secondary | ICD-10-CM | POA: Diagnosis not present

## 2013-09-26 DIAGNOSIS — M899 Disorder of bone, unspecified: Secondary | ICD-10-CM | POA: Diagnosis not present

## 2013-09-26 DIAGNOSIS — J449 Chronic obstructive pulmonary disease, unspecified: Secondary | ICD-10-CM | POA: Diagnosis not present

## 2013-09-26 DIAGNOSIS — I2510 Atherosclerotic heart disease of native coronary artery without angina pectoris: Secondary | ICD-10-CM | POA: Diagnosis not present

## 2013-09-27 NOTE — Telephone Encounter (Signed)
returned pt call.adv pt that aorta duplex results were inher chart but not fwd to Dr.Smith.adv her i will fwd him a message once reviewed by him I will call back with his recommendations.pt verbalized understanding

## 2013-10-03 DIAGNOSIS — M25569 Pain in unspecified knee: Secondary | ICD-10-CM | POA: Diagnosis not present

## 2013-10-04 ENCOUNTER — Ambulatory Visit: Payer: Medicare Other

## 2013-10-08 DIAGNOSIS — M1710 Unilateral primary osteoarthritis, unspecified knee: Secondary | ICD-10-CM | POA: Diagnosis not present

## 2013-10-08 DIAGNOSIS — M25569 Pain in unspecified knee: Secondary | ICD-10-CM | POA: Diagnosis not present

## 2013-10-11 ENCOUNTER — Ambulatory Visit
Admission: RE | Admit: 2013-10-11 | Discharge: 2013-10-11 | Disposition: A | Discharge status: Home / Self Care | Payer: Medicare Other | Source: Ambulatory Visit

## 2013-10-11 DIAGNOSIS — Z9889 Other specified postprocedural states: Secondary | ICD-10-CM

## 2013-10-11 DIAGNOSIS — Z1231 Encounter for screening mammogram for malignant neoplasm of breast: Secondary | ICD-10-CM

## 2013-10-11 DIAGNOSIS — Z853 Personal history of malignant neoplasm of breast: Secondary | ICD-10-CM

## 2013-10-15 DIAGNOSIS — M25569 Pain in unspecified knee: Secondary | ICD-10-CM | POA: Diagnosis not present

## 2013-10-22 DIAGNOSIS — M25569 Pain in unspecified knee: Secondary | ICD-10-CM | POA: Diagnosis not present

## 2013-10-24 DIAGNOSIS — M25569 Pain in unspecified knee: Secondary | ICD-10-CM | POA: Diagnosis not present

## 2013-10-30 DIAGNOSIS — M25569 Pain in unspecified knee: Secondary | ICD-10-CM | POA: Diagnosis not present

## 2013-11-10 DIAGNOSIS — R05 Cough: Secondary | ICD-10-CM | POA: Diagnosis not present

## 2013-11-10 DIAGNOSIS — R059 Cough, unspecified: Secondary | ICD-10-CM | POA: Diagnosis not present

## 2013-11-10 DIAGNOSIS — J189 Pneumonia, unspecified organism: Secondary | ICD-10-CM | POA: Diagnosis not present

## 2013-11-10 DIAGNOSIS — J329 Chronic sinusitis, unspecified: Secondary | ICD-10-CM | POA: Diagnosis not present

## 2013-11-11 DIAGNOSIS — J189 Pneumonia, unspecified organism: Secondary | ICD-10-CM | POA: Diagnosis not present

## 2013-12-05 ENCOUNTER — Telehealth: Payer: Self-pay | Admitting: Internal Medicine

## 2013-12-05 DIAGNOSIS — R0600 Dyspnea, unspecified: Secondary | ICD-10-CM

## 2013-12-05 DIAGNOSIS — G4734 Idiopathic sleep related nonobstructive alveolar hypoventilation: Secondary | ICD-10-CM

## 2013-12-05 NOTE — Telephone Encounter (Signed)
Returning a call to the nurse [phone]

## 2013-12-05 NOTE — Telephone Encounter (Signed)
02 sats on 2lpm  02 02/05/13 desat <88% x 54.8 m >rec repeat on 4 lpm  Done 02/28/13 and no desat  So should be 4lpm at hs and no documentation that she needs it daytime at all but have not seen in her 62m so would need ov to eval that issue.  So can't do poc for 4lpm at hs on a regular basis but ok to use if for a brief trip at 3lpm if [facility] can't help them with  The logistics of using a regular concentrator during travel

## 2013-12-05 NOTE — Telephone Encounter (Signed)
lmomtcb x1 

## 2013-12-05 NOTE — Telephone Encounter (Signed)
lmtcb x1 

## 2013-12-05 NOTE — Telephone Encounter (Signed)
Spoke with pt States that she is trying to get a POC for upcoming travels to [place] states that she leaves 12/20/13 and needs a travel POC to use at night Pt uses O2 at night and with naps Recent ONO 02/2013 done on 4 liters I do not see where results of ONO are documented in the patient chart nor was there an order sent to [facility] to make this change in how the patient is using her O2 at night. The patient states that she was advised by Dr Melvyn Novas at last OV to use 3-4 liters O2 at night [facility] needs to know what level of O2 she NEEDS to be using?  Please advise Dr Melvyn Novas what level of O2 the patient is supposed to be wearing.   The patient states- keep in mind that [facility] only carries POC that have max levels of 3 liters--- she will only be able to use 3 liters of O2 during her travels, order will need to be sent to [facility] for this range if Dr Charolotte Capuchin.

## 2013-12-06 NOTE — Telephone Encounter (Signed)
Order for POC ordered (3lpm hs) DME [facility] Pt aware. Appt scheduled to follow up with Dr Melvyn Novas 12/10/13 at 2:15 Nothing further needed.

## 2013-12-09 ENCOUNTER — Telehealth: Payer: Self-pay | Admitting: Internal Medicine

## 2013-12-09 NOTE — Telephone Encounter (Signed)
Called spoke w/ pt. She reports she had a CXR 11/10/13 in [place]. i advised her when she comes in we will get her to sign a release to get this. Nothing further needed

## 2013-12-10 ENCOUNTER — Encounter: Payer: Self-pay | Admitting: Internal Medicine

## 2013-12-10 ENCOUNTER — Ambulatory Visit (INDEPENDENT_AMBULATORY_CARE_PROVIDER_SITE_OTHER): Payer: Medicare Other | Admitting: Internal Medicine

## 2013-12-10 VITALS — BP 130/66 | HR 82 | Temp 99.1°F | Ht 65.0 in | Wt 227.0 lb

## 2013-12-10 DIAGNOSIS — I739 Peripheral vascular disease, unspecified: Secondary | ICD-10-CM | POA: Diagnosis not present

## 2013-12-10 DIAGNOSIS — R0609 Other forms of dyspnea: Secondary | ICD-10-CM | POA: Diagnosis not present

## 2013-12-10 DIAGNOSIS — G4734 Idiopathic sleep related nonobstructive alveolar hypoventilation: Secondary | ICD-10-CM

## 2013-12-10 DIAGNOSIS — R0989 Other specified symptoms and signs involving the circulatory and respiratory systems: Secondary | ICD-10-CM | POA: Diagnosis not present

## 2013-12-10 DIAGNOSIS — L98499 Non-pressure chronic ulcer of skin of other sites with unspecified severity: Secondary | ICD-10-CM

## 2013-12-10 DIAGNOSIS — R0600 Dyspnea, unspecified: Secondary | ICD-10-CM

## 2013-12-10 NOTE — Patient Instructions (Addendum)
Please see patient coordinator before you leave today  to schedule travel arrangements with advanced to stay on 4lpm at bedtime during your trip   See Dr Daneen Schick re the diagnosis of pulmonary hypertension and I will send him a copy of my thoughts   Schedule pulmonary sleep evaluation with one of our pulmonary doctors   I would be happy to see you in the event of a new problem with cough or daytime breathing issues

## 2013-12-10 NOTE — Progress Notes (Signed)
Subjective:   Patient ID: [name], female    DOB: December 18, 1940 MRN: [id number]    Brief patient profile:  89 yowf quit smoking 2005 at wt 200  With no significant copd by PFT's here in 2007 self referred for "copd" but pft's nl again 12/12/2012    HPI 10/24/2012 1st pulmonary ov since 2010 cc persistent x 5 years but somewhat variable doe x one aisle at [facility] off 02 on ACEi, spriva, and combivent which helps a little but can't turn a bad day into a good one. Bad days tend to be assoc with more cough and some hoarseness on ACEi which I  Recommended should be stopped if symptoms worsened in 2010 but they really haven't changed much one way or the other since then rec Consider stopping ramapril    12/12/12 f/u ov/ Kerry Odonohue re cough/ sob with nl pfts Chief Complaint  Patient presents with  . Followup with PFT    Pt states that overall her cough and SOB have improved since her last visit. No new co's today.    she is marginally better but still can't do an aisle at [employer] with dry cough and sob and not really much better p combivent rec Stop ramapril and spiriva Start avapro 150 mg one daily and if too strong take half (your new blood pressure pill) Only use your combivent    02/01/2013 f/u ov/Matisha Termine re: pseudoasthma Chief Complaint  Patient presents with  . Follow-up    Pt states that her cough and SOB continue to improve and her BP is doing well with avapro. She has noticed some swelling in her ankles since BP med change.     rarely need combivent at all, not really limited by sob and able to shop s difficulty  rec Add hctz (hydordiuril) 25 mg daily  Reduce avapro 150 mg one half daily  Please see patient coordinator before you leave today  to schedule ono on 02 > 4lpm      12/10/2013 f/u ov/Rudolfo Brandow re: noct 02 :  02 sats on 02 as low mid 80's when wakes up  Chief Complaint  Patient presents with  . Follow-up    Pt last seen on 01/2013. Pt states she is having DOE. Pt states her  cough has resolved. Pt wearing 4L O2 at night. Pt has questions about CXR that was done on 11/10/2013 that was done at [facility] in [place]. Pt states she was told that she has pulmonary hypertension.   1st week in July acute ill with purulent sputum > rx in [place] and bck to baseline but was told may have [place] - note seen by Dr Linard Millers and says had echo last 6 m but not in epic.    No obvious daytime variabilty  or cp or chest tightness, subjective wheeze overt sinus or hb symptoms. No unusual exp hx or h/o childhood pna/ asthma or knowledge of premature birth.    Sleeping ok without nocturnal  or early am exacerbation  of respiratory  c/o's or need for noct saba. Also denies any obvious fluctuation of symptoms with weather or environmental changes or other aggravating or alleviating factors except as outlined above   Current Medications, Allergies, Past Medical History, Past Surgical History, Family History, and Social History were reviewed in [employer] record.  ROS  The following are not active complaints unless bolded sore throat, dysphagia, dental problems, itching, sneezing,  nasal congestion or excess/ purulent secretions,  ear ache,   fever, chills, sweats, unintended wt loss, pleuritic or exertional cp, hemoptysis,  orthopnea pnd or leg swelling bilaterally, presyncope, palpitations, heartburn, abdominal pain, anorexia, nausea, vomiting, diarrhea  or change in bowel or urinary habits, change in stools or urine, dysuria,hematuria,  rash, arthralgias, visual complaints, headache, numbness weakness or ataxia or problems with walking or coordination,  change in mood/affect or memory.         Past Medical History:  Hx of NEOPLASM, MALIGNANT, BREAST, HX OF (ICD-V10.3)  HYPERTENSION (ICD-401.9)  HYPERLIPIDEMIA (ICD-272.4)     Past Surgical History:  Right Lumpectomy 1992 Left breast lumpectomy 2007  Back surgery 1974  Cholecystectomy 1981    Family History:  allergies in mother  neg copd or other resp dz   Social History:  Married  Children  Retired  Former smoker. Quit in April 2005. Smoked for 40 years at 1 ppd.      Objective:   Physical Exam   amb wf nad  12/12/2012       229 > 231 02/01/2013 > 12/10/2013  227   Wt Readings from Last 3 Encounters:  10/24/12 230 lb (104.327 kg)  07/07/11 224 lb (101.606 kg)  07/07/11 224 lb (101.606 kg)    HEENT: nl dentition, turbinates, and orophanx. Nl external ear canals without cough reflex   NECK :  without JVD/Nodes/TM/ nl carotid upstrokes bilaterally   LUNGS: no acc muscle use, clear to A and P bilaterally without cough on insp or exp maneuvers   CV:  RRR  no s3 or murmur or increase in P2,  Trace edema sym bilateral LE   ABD:  soft and nontender with nl excursion in the supine position. No bruits or organomegaly, bowel sounds nl  MS:  warm without deformities, calf tenderness, cyanosis or clubbing  SKIN: warm and dry without lesions    NEURO:  alert, approp, no deficits     CXR in [place] 11/09/13 w/e "ok except for fx rib "    Assessment & Plan:

## 2013-12-11 DIAGNOSIS — R9389 Abnormal findings on diagnostic imaging of other specified body structures: Secondary | ICD-10-CM | POA: Diagnosis not present

## 2013-12-11 DIAGNOSIS — Z23 Encounter for immunization: Secondary | ICD-10-CM | POA: Diagnosis not present

## 2013-12-11 DIAGNOSIS — R931 Abnormal findings on diagnostic imaging of heart and coronary circulation: Secondary | ICD-10-CM | POA: Insufficient documentation

## 2013-12-11 DIAGNOSIS — L309 Dermatitis, unspecified: Secondary | ICD-10-CM | POA: Insufficient documentation

## 2013-12-11 DIAGNOSIS — J449 Chronic obstructive pulmonary disease, unspecified: Secondary | ICD-10-CM | POA: Diagnosis not present

## 2013-12-11 DIAGNOSIS — L259 Unspecified contact dermatitis, unspecified cause: Secondary | ICD-10-CM | POA: Diagnosis not present

## 2013-12-11 NOTE — Assessment & Plan Note (Signed)
-   10/24/2012   Walked RA x 3 laps @ 185 stopped due to sob and legs weak about the same time but no desats - ONO RA 11/29/12  Pos desat < 89% x 2 h 21 min > rec 2lpm at hs  11/30/2012 and then repeat on 2lpm - repeat 02 sats on 02 02/05/13 desat <88% x 54.8 m >rec repeat on 4 lpm  Done 02/28/13 and no desat  Pt requesting sleep eval > referred to pulmonary sleep doc first since already on 4lpm to work out which study is needed

## 2013-12-11 NOTE — Assessment & Plan Note (Addendum)
-   PFTs 08/02/05 FEV1 2.05 ( 97%) ratio 76%   - 10/24/2012   Walked RA x 3 laps @ 185 stopped due to sob and legs weak about the same time but no desats - PFT's 12/12/2012  wnl with dlco 68 corrects to 78% (no combivent day of pfts) - rec trial off acei 12/12/12  > marked improvement 02/01/13  She is back to baseline now after apparent uri in [place] with main issue obesity/ deconditioning and no need for pulmonary meds  PAH issue raised in [place] > already has had echo and is at risk from obesity/ noct desat > referred back to Dr [name] for cards w/u and to pulmonary sleep for nocturnal issues which risk secondary pah but note last bnp was << 100 so doubt she has sign heart failure of any type at present.   Pulmonary f/u is prn

## 2013-12-19 DIAGNOSIS — IMO0001 Reserved for inherently not codable concepts without codable children: Secondary | ICD-10-CM | POA: Diagnosis not present

## 2013-12-20 ENCOUNTER — Telehealth: Payer: Self-pay | Admitting: Interventional Cardiology

## 2013-12-20 NOTE — Telephone Encounter (Signed)
Reviewed 12/10/13 office visit with Dr Melvyn Novas and he also referred pt back to Dr [name] for Cardiac work-up.  I made the pt aware that I will forward this message to Lattie Haw to contact her next week in regards to follow-up.  I reviewed Dr Thompson Caul schedule and his first availability is October.

## 2013-12-20 NOTE — Telephone Encounter (Signed)
New message          Pt was diagnose with Pulmonary Hypertension and needs to see dr [name] per PCP

## 2013-12-31 NOTE — Telephone Encounter (Signed)
pt aware appt ssh with Dr.Smith for 9/9 @ 9am.pt verbalized understanding.

## 2014-01-15 ENCOUNTER — Ambulatory Visit (INDEPENDENT_AMBULATORY_CARE_PROVIDER_SITE_OTHER): Payer: Medicare Other | Admitting: Interventional Cardiology

## 2014-01-15 ENCOUNTER — Encounter: Payer: Self-pay | Admitting: Interventional Cardiology

## 2014-01-15 VITALS — BP 138/70 | HR 71 | Ht 65.0 in | Wt 228.0 lb

## 2014-01-15 DIAGNOSIS — J410 Simple chronic bronchitis: Secondary | ICD-10-CM

## 2014-01-15 DIAGNOSIS — L98499 Non-pressure chronic ulcer of skin of other sites with unspecified severity: Secondary | ICD-10-CM

## 2014-01-15 DIAGNOSIS — I2510 Atherosclerotic heart disease of native coronary artery without angina pectoris: Secondary | ICD-10-CM

## 2014-01-15 DIAGNOSIS — G4734 Idiopathic sleep related nonobstructive alveolar hypoventilation: Secondary | ICD-10-CM

## 2014-01-15 DIAGNOSIS — I739 Peripheral vascular disease, unspecified: Secondary | ICD-10-CM

## 2014-01-15 DIAGNOSIS — E118 Type 2 diabetes mellitus with unspecified complications: Secondary | ICD-10-CM | POA: Diagnosis not present

## 2014-01-15 DIAGNOSIS — I10 Essential (primary) hypertension: Secondary | ICD-10-CM | POA: Diagnosis not present

## 2014-01-15 DIAGNOSIS — E785 Hyperlipidemia, unspecified: Secondary | ICD-10-CM

## 2014-01-15 NOTE — Patient Instructions (Signed)
Your physician recommends that you continue on your current medications as directed. Please refer to the Current Medication list given to you today.  Your physician has requested that you have an echocardiogram. Echocardiography is a painless test that uses sound waves to create images of your heart. It provides your doctor with information about the size and shape of your heart and how well your heart's chambers and valves are working. This procedure takes approximately one hour. There are no restrictions for this procedure.   Your physician recommends that you schedule a follow-up appointment pending results. Additional testing may be required

## 2014-01-15 NOTE — Progress Notes (Signed)
Patient ID: [name], female   DOB: [birth year], 73 y.o.   MRN: [id number]    [address], [address] [place], [place]  [address] Phone: [phone] Fax:  [phone]  Date:  01/15/2014   ID:  [name], DOB Feb 26, 1941, MRN [id number]  PCP:  Gennette Pac, MD   ASSESSMENT:   1. nocturnal hypoxemia with suspicion of pulmonary hypertension 2. Known coronary disease with high-grade stenosis in the distal RCA with collaterals from circumflex 3. Obstructive sleep apnea 4. Peripheral vascular disease 5. Dyspnea  PLAN:  1. 2-D Doppler echocardiogram to assess pulmonary artery pressures 2. If pulmonary hypertension as documented on the left and right heart cath. If we are unable to document pulmonary pressures by echo, will need left and right heart cath. 3. Otherwise return to see me in one year   SUBJECTIVE: [name] is a 73 y.o. female who is being seen by pulmonary because of nocturnal hypoxemia. She has a known history of coronary artery disease with distal RCA collateralized from the left coronary system. She has exertional dyspnea. She has been found to have significant hypoxia while sleeping. She is undergoing a workup for obstructive sleep apnea. She is referred back by Dr. Melvyn Novas for further cardiac evaluation of possible pulmonary hypertension. The suspicion of pulmonary hypertension came from an x-ray done in July at an outlying hospital when pulmonary artery enlargement was noted. She denies angina. She denies lower extremity edema although in July when x-ray was done she states that there was lower extremity edema.   Wt Readings from Last 3 Encounters:  01/15/14 228 lb (103.42 kg)  12/10/13 227 lb (102.967 kg)  06/10/13 233 lb (105.688 kg)     Past Medical History  Diagnosis Date  . Hyperlipidemia   . COPD (chronic obstructive pulmonary disease)   . Asthma   . DM (diabetes mellitus)   . Osteopenia   . Breast cancer   . Hypertension   .  Depression   . PVD (peripheral vascular disease)   . Coronary atherosclerosis     Current Outpatient Prescriptions  Medication Sig Dispense Refill  . albuterol-ipratropium (COMBIVENT) 18-103 MCG/ACT inhaler Inhale 2 puffs into the lungs every 6 (six) hours as needed for wheezing.      Marland Kitchen aspirin 81 MG tablet Take 81 mg by mouth daily.      Marland Kitchen azelastine (ASTELIN) 137 MCG/SPRAY nasal spray       . cholecalciferol (VITAMIN D) 1000 UNITS tablet Take 2,000 Units by mouth daily.      . hydrochlorothiazide (HYDRODIURIL) 25 MG tablet Take 25 mg by mouth daily as needed (swelling). As needed      . insulin glargine (LANTUS) 100 UNIT/ML injection 25 units Q PM, 35 units Q AM      . insulin lispro (HUMALOG) 100 UNIT/ML injection Inject 25-30 Units into the skin 3 (three) times daily before meals. Dose adjusted by carb count and blood sugar levels      . irbesartan (AVAPRO) 75 MG tablet Take 75 mg by mouth daily.      . isosorbide mononitrate (IMDUR) 60 MG 24 hr tablet Take 60 mg by mouth daily.      . Naproxen Sodium (ALEVE PO) Take 1-2 tablets by mouth every 12 (twelve) hours as needed (for leg pain).      . rosuvastatin (CRESTOR) 40 MG tablet Take 40 mg by mouth daily.      . sertraline (ZOLOFT) 100 MG tablet Take  100 mg by mouth daily.       No current facility-administered medications for this visit.    Allergies:    Allergies  Allergen Reactions  . Amoxicillin Other (See Comments)    REACTION: gi upset  . Codeine   . Diclofenac Sodium Hives  . Dye Fdc Red  [Red Dye]   . Erythromycin Diarrhea    REACTION: gi upset  . Glipizide Other (See Comments)    REACTION: gi upset  . Metformin Diarrhea    REACTION: gi upset  . Metformin And Related   . Methocarbamol Other (See Comments)    REACTION: tachycardia  . Niacin Hives    REACTION: rash and sob  . Penicillins   . Ramipril   . Sulfa Antibiotics   . Tetanus Immune Globulin   . Voltaren  [Diclofenac]   . Tetanus Toxoid Itching,  Swelling and Rash    REACTION: swelling    Social History:  The patient  reports that she quit smoking about 10 years ago. Her smoking use included Cigarettes. She has a 40 pack-year smoking history. She has never used smokeless tobacco. She reports that she does not drink alcohol or use illicit drugs.   ROS:  Please see the history of present illness.   Denies syncope, chest pain, orthopnea, PND, palpitations, syncope   All other systems reviewed and negative.   OBJECTIVE: VS:  BP 138/70  Pulse 71  Ht 5\' 5"  (1.651 m)  Wt 228 lb (103.42 kg)  BMI 37.94 kg/m2 Well nourished, well developed, in no acute distress, moderate to morbid obesity HEENT: normal Neck: JVD difficult to assess. Carotid bruit absent  Cardiac:  normal S1, S2; RRR; no murmur Lungs:  clear to auscultation bilaterally, no wheezing, rhonchi or rales Abd: soft, nontender, no hepatomegaly Ext: Edema absent. Pulses 2+ Skin: warm and dry Neuro:  CNs 2-12 intact, no focal abnormalities noted  EKG:  Not performed       Signed, Illene Labrador III, MD 01/15/2014 9:25 AM

## 2014-01-20 ENCOUNTER — Encounter: Payer: Self-pay | Admitting: Pulmonary Disease

## 2014-01-20 ENCOUNTER — Ambulatory Visit (INDEPENDENT_AMBULATORY_CARE_PROVIDER_SITE_OTHER): Payer: Medicare Other | Admitting: Pulmonary Disease

## 2014-01-20 VITALS — BP 116/70 | HR 87 | Ht 65.0 in | Wt 228.0 lb

## 2014-01-20 DIAGNOSIS — L98499 Non-pressure chronic ulcer of skin of other sites with unspecified severity: Secondary | ICD-10-CM

## 2014-01-20 DIAGNOSIS — R0989 Other specified symptoms and signs involving the circulatory and respiratory systems: Secondary | ICD-10-CM

## 2014-01-20 DIAGNOSIS — R0683 Snoring: Secondary | ICD-10-CM

## 2014-01-20 DIAGNOSIS — R0609 Other forms of dyspnea: Secondary | ICD-10-CM | POA: Diagnosis not present

## 2014-01-20 DIAGNOSIS — I739 Peripheral vascular disease, unspecified: Secondary | ICD-10-CM | POA: Diagnosis not present

## 2014-01-20 DIAGNOSIS — G4733 Obstructive sleep apnea (adult) (pediatric): Secondary | ICD-10-CM

## 2014-01-20 HISTORY — DX: Obstructive sleep apnea (adult) (pediatric): G47.33

## 2014-01-20 NOTE — Assessment & Plan Note (Addendum)
She has snoring, sleep disruption, and daytime sleepiness.  She has hx of COPD on home oxygen, CAD, HTN, and possible pulmonary hypertension.  I am concerned she could have sleep apnea.  We discussed how sleep apnea can affect various health problems including risks for hypertension, cardiovascular disease, and diabetes.  We also discussed how sleep disruption can increase risks for accident, such as while driving.  Weight loss as a means of improving sleep apnea was also reviewed.  Additional treatment options discussed were CPAP therapy, oral appliance, and surgical intervention.  To further assess she will need to have in lab sleep study.  Will have this study started on room air and then add oxygen as needed.

## 2014-01-20 NOTE — Progress Notes (Signed)
   Subjective:    Patient ID: [name], female    DOB: [birth year], 73 y.o.   MRN: [id number]  HPI    Review of Systems  Constitutional: Negative for fever and unexpected weight change.  HENT: Positive for rhinorrhea. Negative for congestion, dental problem, ear pain, nosebleeds, postnasal drip, sinus pressure, sneezing, sore throat and trouble swallowing.   Eyes: Negative for redness and itching.  Respiratory: Positive for shortness of breath. Negative for cough, chest tightness and wheezing.   Cardiovascular: Negative for palpitations and leg swelling.  Gastrointestinal: Negative for nausea and vomiting.  Genitourinary: Negative for dysuria.  Musculoskeletal: Negative for joint swelling.  Skin: Negative for rash.  Neurological: Negative for headaches.  Hematological: Does not bruise/bleed easily.  Psychiatric/Behavioral: Negative for dysphoric mood. The patient is not nervous/anxious.        Objective:   Physical Exam        Assessment & Plan:

## 2014-01-20 NOTE — Progress Notes (Signed)
Chief Complaint  Patient presents with  . Sleep Consult    Referred by Dr. Melvyn Novas. Pt c/o snoring. Epworth=5    History of Present Illness: [name] is a 73 y.o. female for evaluation of sleep problems.  She was found to have low oxygen while asleep.  There is also concern she has pulmonary hypertension.  She has hx of CAD.  As a result she was advised to have further assessment for sleep apnea.  She is followed by Dr. Melvyn Novas for COPD and chronic hypoxic respiratory failure.  Her daughter in law has told her that she snores, and will stop breathing while asleep.  She will wake up sometimes with a snort.  Her mouth gets dry at night.  She has trouble with sleeping on her back.  She will sometimes have trouble falling asleep and staying asleep >> usually due to worry about her family.  She goes to sleep at 2 am.  She falls asleep after 30 to 60 minutes.  She wakes up multiple times to use the bathroom.  She gets out of bed at 10 am.  She feels okay in the morning.  She denies morning headache.  She does not use anything to help her fall sleep or stay awake.  She denies sleep walking, sleep talking, bruxism, or nightmares.  There is no history of restless legs.  She denies sleep hallucinations, sleep paralysis, or cataplexy.  The Epworth score is 5 out of 24.  [name]  has a past medical history of Hyperlipidemia; COPD (chronic obstructive pulmonary disease); Asthma; DM (diabetes mellitus); Osteopenia; Breast cancer; Hypertension; Depression; PVD (peripheral vascular disease); and Coronary atherosclerosis.  [name]  has past surgical history that includes Appendectomy; Cholecystectomy; Cyst removal neck; and Breast lumpectomy.  Prior to Admission medications   Medication Sig Start Date End Date Taking? Authorizing Provider  albuterol-ipratropium (COMBIVENT) 18-103 MCG/ACT inhaler Inhale 2 puffs into the lungs every 6 (six) hours as needed for wheezing.   Yes  Historical Provider, MD  aspirin 81 MG tablet Take 81 mg by mouth daily.   Yes Historical Provider, MD  azelastine (ASTELIN) 137 MCG/SPRAY nasal spray  04/09/13  Yes Historical Provider, MD  cholecalciferol (VITAMIN D) 1000 UNITS tablet Take 2,000 Units by mouth daily.   Yes Historical Provider, MD  hydrochlorothiazide (HYDRODIURIL) 25 MG tablet Take 25 mg by mouth daily as needed (swelling). As needed 02/01/13  Yes Tanda Rockers, MD  insulin glargine (LANTUS) 100 UNIT/ML injection 25 units Q PM, 35 units Q AM   Yes Historical Provider, MD  insulin lispro (HUMALOG) 100 UNIT/ML injection Inject 25-30 Units into the skin 3 (three) times daily before meals. Dose adjusted by carb count and blood sugar levels   Yes Historical Provider, MD  irbesartan (AVAPRO) 75 MG tablet Take 75 mg by mouth daily.   Yes Historical Provider, MD  isosorbide mononitrate (IMDUR) 60 MG 24 hr tablet Take 60 mg by mouth daily.   Yes Historical Provider, MD  Naproxen Sodium (ALEVE PO) Take 1-2 tablets by mouth every 12 (twelve) hours as needed (for leg pain).   Yes Historical Provider, MD  rosuvastatin (CRESTOR) 40 MG tablet Take 40 mg by mouth daily.   Yes Historical Provider, MD  sertraline (ZOLOFT) 100 MG tablet Take 100 mg by mouth daily.   Yes Historical Provider, MD    Allergies  Allergen Reactions  . Amoxicillin Other (See Comments)    REACTION: gi upset  . Codeine   .  Diclofenac Sodium Hives  . Dye Fdc Red  [Red Dye]   . Erythromycin Diarrhea    REACTION: gi upset  . Glipizide Other (See Comments)    REACTION: gi upset  . Metformin Diarrhea    REACTION: gi upset  . Metformin And Related   . Methocarbamol Other (See Comments)    REACTION: tachycardia  . Niacin Hives    REACTION: rash and sob  . Penicillins   . Ramipril   . Sulfa Antibiotics   . Tetanus Immune Globulin   . Voltaren  [Diclofenac]   . Tetanus Toxoid Itching, Swelling and Rash    REACTION: swelling    Her family history includes Heart  disease in her father and mother.  She  reports that she quit smoking about 10 years ago. Her smoking use included Cigarettes. She has a 40 pack-year smoking history. She has never used smokeless tobacco. She reports that she does not drink alcohol or use illicit drugs.   Physical Exam:  General - No distress ENT - No sinus tenderness, no oral exudate, no LAN, no thyromegaly, TM clear, pupils equal/reactive, MP 3, scalloped tongue Cardiac - s1s2 regular, no murmur, pulses symmetric Chest - No wheeze/rales/dullness, good air entry, normal respiratory excursion Back - No focal tenderness Abd - Soft, non-tender, no organomegaly, + bowel sounds Ext - No edema Neuro - Normal strength, cranial nerves intact Skin - No rashes Psych - Normal mood, and behavior  Assessment/plan:  Chesley Mires, M.D. Pager [phone]

## 2014-01-20 NOTE — Patient Instructions (Signed)
Will arrange for sleep study Will call to arrange for follow up after sleep study reviewed 

## 2014-01-21 ENCOUNTER — Ambulatory Visit (HOSPITAL_COMMUNITY): Payer: Medicare Other | Attending: Interventional Cardiology | Admitting: Cardiology

## 2014-01-21 DIAGNOSIS — R0989 Other specified symptoms and signs involving the circulatory and respiratory systems: Secondary | ICD-10-CM | POA: Diagnosis present

## 2014-01-21 DIAGNOSIS — R0602 Shortness of breath: Secondary | ICD-10-CM

## 2014-01-21 DIAGNOSIS — I10 Essential (primary) hypertension: Secondary | ICD-10-CM | POA: Diagnosis not present

## 2014-01-21 DIAGNOSIS — J4489 Other specified chronic obstructive pulmonary disease: Secondary | ICD-10-CM | POA: Insufficient documentation

## 2014-01-21 DIAGNOSIS — R0600 Dyspnea, unspecified: Secondary | ICD-10-CM

## 2014-01-21 DIAGNOSIS — I2510 Atherosclerotic heart disease of native coronary artery without angina pectoris: Secondary | ICD-10-CM | POA: Insufficient documentation

## 2014-01-21 DIAGNOSIS — J449 Chronic obstructive pulmonary disease, unspecified: Secondary | ICD-10-CM | POA: Diagnosis not present

## 2014-01-21 DIAGNOSIS — E785 Hyperlipidemia, unspecified: Secondary | ICD-10-CM | POA: Diagnosis not present

## 2014-01-21 DIAGNOSIS — G4734 Idiopathic sleep related nonobstructive alveolar hypoventilation: Secondary | ICD-10-CM

## 2014-01-21 DIAGNOSIS — R0609 Other forms of dyspnea: Secondary | ICD-10-CM | POA: Insufficient documentation

## 2014-01-21 NOTE — Progress Notes (Signed)
Echo performed. 

## 2014-01-22 ENCOUNTER — Telehealth: Payer: Self-pay

## 2014-01-22 NOTE — Telephone Encounter (Signed)
Message copied by Lamar Laundry on Wed Jan 22, 2014  4:26 PM ------      Message from: Daneen Schick      Created: Tue Jan 21, 2014  6:04 PM       No significant abnormality ------

## 2014-01-22 NOTE — Telephone Encounter (Signed)
pt aware of echo results.No significant abnormality.pt verbalized understanding.

## 2014-02-04 ENCOUNTER — Ambulatory Visit (HOSPITAL_BASED_OUTPATIENT_CLINIC_OR_DEPARTMENT_OTHER): Payer: Medicare Other | Attending: Pulmonary Disease | Admitting: Radiology

## 2014-02-04 VITALS — Ht 65.0 in | Wt 227.0 lb

## 2014-02-04 DIAGNOSIS — I10 Essential (primary) hypertension: Secondary | ICD-10-CM | POA: Diagnosis not present

## 2014-02-04 DIAGNOSIS — G4733 Obstructive sleep apnea (adult) (pediatric): Secondary | ICD-10-CM

## 2014-02-04 DIAGNOSIS — J449 Chronic obstructive pulmonary disease, unspecified: Secondary | ICD-10-CM | POA: Insufficient documentation

## 2014-02-04 DIAGNOSIS — I2510 Atherosclerotic heart disease of native coronary artery without angina pectoris: Secondary | ICD-10-CM | POA: Diagnosis not present

## 2014-02-04 DIAGNOSIS — R0683 Snoring: Secondary | ICD-10-CM

## 2014-02-04 DIAGNOSIS — G4730 Sleep apnea, unspecified: Secondary | ICD-10-CM | POA: Diagnosis present

## 2014-02-04 DIAGNOSIS — G4710 Hypersomnia, unspecified: Secondary | ICD-10-CM | POA: Diagnosis present

## 2014-02-04 DIAGNOSIS — J4489 Other specified chronic obstructive pulmonary disease: Secondary | ICD-10-CM | POA: Diagnosis not present

## 2014-02-07 DIAGNOSIS — H2513 Age-related nuclear cataract, bilateral: Secondary | ICD-10-CM | POA: Diagnosis not present

## 2014-02-07 DIAGNOSIS — E119 Type 2 diabetes mellitus without complications: Secondary | ICD-10-CM | POA: Diagnosis not present

## 2014-02-18 ENCOUNTER — Telehealth: Payer: Self-pay | Admitting: Pulmonary Disease

## 2014-02-18 ENCOUNTER — Encounter: Payer: Self-pay | Admitting: Pulmonary Disease

## 2014-02-18 DIAGNOSIS — G4733 Obstructive sleep apnea (adult) (pediatric): Secondary | ICD-10-CM

## 2014-02-18 NOTE — Sleep Study (Signed)
[place]  NAME: [name] DATE OF BIRTH:  09-18-1940 MEDICAL RECORD NUMBER [id number]  LOCATION: [facility]  PHYSICIAN: Chesley Mires, M.D. DATE OF STUDY: 02/04/2014  SLEEP STUDY TYPE: Polysomnogram               REFERRING PHYSICIAN: Chesley Mires, MD  INDICATION FOR STUDY:  [name] is a 73 y.o. female who presents to the sleep lab for evaluation of hypersomnia with obstructive sleep apnea.  She reports snoring, sleep disruption, apnea, and daytime sleepiness.  She has hx of COPD, HTN, and CAD.  EPWORTH SLEEPINESS SCORE: 5. HEIGHT: 5\' 5"  (165.1 cm)  WEIGHT: 227 lb (102.967 kg)    Body mass index is 37.77 kg/(m^2).  NECK SIZE: 16 in.  MEDICATIONS:  Current Outpatient Prescriptions on File Prior to Visit  Medication Sig Dispense Refill  . albuterol-ipratropium (COMBIVENT) 18-103 MCG/ACT inhaler Inhale 2 puffs into the lungs every 6 (six) hours as needed for wheezing.      Marland Kitchen aspirin 81 MG tablet Take 81 mg by mouth daily.      Marland Kitchen azelastine (ASTELIN) 137 MCG/SPRAY nasal spray       . cholecalciferol (VITAMIN D) 1000 UNITS tablet Take 2,000 Units by mouth daily.      . hydrochlorothiazide (HYDRODIURIL) 25 MG tablet Take 25 mg by mouth daily as needed (swelling). As needed      . insulin glargine (LANTUS) 100 UNIT/ML injection 25 units Q PM, 35 units Q AM      . insulin lispro (HUMALOG) 100 UNIT/ML injection Inject 25-30 Units into the skin 3 (three) times daily before meals. Dose adjusted by carb count and blood sugar levels      . irbesartan (AVAPRO) 75 MG tablet Take 75 mg by mouth daily.      . isosorbide mononitrate (IMDUR) 60 MG 24 hr tablet Take 60 mg by mouth daily.      . Naproxen Sodium (ALEVE PO) Take 1-2 tablets by mouth every 12 (twelve) hours as needed (for leg pain).      . rosuvastatin (CRESTOR) 40 MG tablet Take 40 mg by mouth daily.      . sertraline (ZOLOFT) 100 MG tablet Take 100 mg by mouth daily.        No current facility-administered medications on file prior to visit.    SLEEP ARCHITECTURE:  Total recording time: 362.5 minutes.  Total sleep time was: 188 minutes.  Sleep efficiency: 51.9%.  Sleep latency: 17 minutes.  REM latency: N/A minutes.  Stage N1: 32.7%.  Stage N2: 67.3%.  Stage N3: 0%.  Stage R:  0%.  Supine sleep: 0 minutes.  Non-supine sleep: 188 minutes.  CARDIAC DATA:  Average heart rate: 68 beats per minute. Rhythm strip: sinus rhythm with occasional PVC's.  RESPIRATORY DATA: Average respiratory rate: 16. Snoring: loud. Average AHI: 142.3.   Apnea index: 106.9.  Hypopnea index: 35.4. Obstructive apnea index: 81.1.  Central apnea index: 11.5.  Mixed apnea index: 14. REM AHI: N/A.  NREM AHI: 142.3. Supine AHI: N/A. Non-supine AHI: 142.3.  MOVEMENT/PARASOMNIA:  Periodic limb movement: 0.  Period limb movements with arousals: 0. Restroom trips: three.  OXYGEN DATA:  Baseline oxygenation: 94%. Lowest SaO2: 82%. Time spent below SaO2 90%: 165.8 minutes. Supplemental oxygen used: none.  IMPRESSION/ RECOMMENDATION:   This study shows severe obstructive sleep apnea with an AHI of 142.3 and SaO2 low of 82%.   She should return to the sleep lab for  a CPAP titration study.   Chesley Mires, M.D. Diplomate, Tax adviser of Sleep Medicine  ELECTRONICALLY SIGNED ON:  02/18/2014, 2:52 PM [place] PH: [phone]   FX: [phone] [place]

## 2014-02-18 NOTE — Telephone Encounter (Signed)
PSG 02/04/14 >> AHI 142.3, SaO2 low 82%  Results d/w pt over phone.  Will arrange for in lab titration study and call her with results.

## 2014-02-22 ENCOUNTER — Other Ambulatory Visit: Payer: Self-pay | Admitting: Internal Medicine

## 2014-03-13 ENCOUNTER — Other Ambulatory Visit: Payer: Self-pay | Admitting: Internal Medicine

## 2014-03-17 ENCOUNTER — Encounter (HOSPITAL_BASED_OUTPATIENT_CLINIC_OR_DEPARTMENT_OTHER): Payer: Medicare Other

## 2014-03-17 ENCOUNTER — Ambulatory Visit (HOSPITAL_BASED_OUTPATIENT_CLINIC_OR_DEPARTMENT_OTHER): Payer: Medicare Other | Attending: Pulmonary Disease | Admitting: Radiology

## 2014-03-17 VITALS — Ht 65.0 in | Wt 227.0 lb

## 2014-03-17 DIAGNOSIS — Z794 Long term (current) use of insulin: Secondary | ICD-10-CM | POA: Diagnosis not present

## 2014-03-17 DIAGNOSIS — J449 Chronic obstructive pulmonary disease, unspecified: Secondary | ICD-10-CM | POA: Diagnosis not present

## 2014-03-17 DIAGNOSIS — Z7982 Long term (current) use of aspirin: Secondary | ICD-10-CM | POA: Insufficient documentation

## 2014-03-17 DIAGNOSIS — I10 Essential (primary) hypertension: Secondary | ICD-10-CM | POA: Diagnosis not present

## 2014-03-17 DIAGNOSIS — G4730 Sleep apnea, unspecified: Secondary | ICD-10-CM | POA: Diagnosis not present

## 2014-03-17 DIAGNOSIS — I2510 Atherosclerotic heart disease of native coronary artery without angina pectoris: Secondary | ICD-10-CM | POA: Diagnosis not present

## 2014-03-17 DIAGNOSIS — Z6837 Body mass index (BMI) 37.0-37.9, adult: Secondary | ICD-10-CM | POA: Insufficient documentation

## 2014-03-17 DIAGNOSIS — Z79899 Other long term (current) drug therapy: Secondary | ICD-10-CM | POA: Insufficient documentation

## 2014-03-17 DIAGNOSIS — G4733 Obstructive sleep apnea (adult) (pediatric): Secondary | ICD-10-CM

## 2014-03-18 ENCOUNTER — Telehealth: Payer: Self-pay | Admitting: Pulmonary Disease

## 2014-03-18 DIAGNOSIS — G4731 Primary central sleep apnea: Secondary | ICD-10-CM

## 2014-03-18 DIAGNOSIS — G4730 Sleep apnea, unspecified: Secondary | ICD-10-CM | POA: Diagnosis not present

## 2014-03-18 NOTE — Telephone Encounter (Signed)
ATC x 1 wcb 

## 2014-03-18 NOTE — Telephone Encounter (Signed)
BiPAP titration 03/17/14 >> BiPAP 20/14 cm H2O >> AHI 2.9, +R.  Centrals with CPAP.  Will have my nurse inform pt that she did well with BiPAP once this was adjusted to pressure of 20/14 cm H2O.  I have sent order to get BiPAP set up through DME.  She will need ROV 2 months after starting BiPAP.

## 2014-03-18 NOTE — Sleep Study (Signed)
[place]  NAME: [name] DATE OF BIRTH:  1940-07-02 MEDICAL RECORD NUMBER [id number]  LOCATION: [facility]  PHYSICIAN: Chesley Mires, M.D. DATE OF STUDY: 03/17/2014  SLEEP STUDY TYPE: CPAP/BiPAP titration study               REFERRING PHYSICIAN: Chesley Mires, MD  INDICATION FOR STUDY:  [name] is a 73 y.o. female with hx of COPD, HTN, and CAD was found to have severe sleep apnea on sleep study 02/04/14 with an AHI of 142.3 and SaO2 low of 82%.  She returns to the sleep lab for a titration study.  EPWORTH SLEEPINESS SCORE: 5. HEIGHT: 5\' 5"  (165.1 cm)  WEIGHT: 227 lb (102.967 kg)    Body mass index is 37.77 kg/(m^2).  NECK SIZE: 16 in.  MEDICATIONS:  Current Outpatient Prescriptions on File Prior to Visit  Medication Sig Dispense Refill  . albuterol-ipratropium (COMBIVENT) 18-103 MCG/ACT inhaler Inhale 2 puffs into the lungs every 6 (six) hours as needed for wheezing.    Marland Kitchen aspirin 81 MG tablet Take 81 mg by mouth daily.    Marland Kitchen azelastine (ASTELIN) 137 MCG/SPRAY nasal spray     . cholecalciferol (VITAMIN D) 1000 UNITS tablet Take 2,000 Units by mouth daily.    . hydrochlorothiazide (HYDRODIURIL) 25 MG tablet Take 25 mg by mouth daily as needed (swelling). As needed    . insulin glargine (LANTUS) 100 UNIT/ML injection 25 units Q PM, 35 units Q AM    . insulin lispro (HUMALOG) 100 UNIT/ML injection Inject 25-30 Units into the skin 3 (three) times daily before meals. Dose adjusted by carb count and blood sugar levels    . irbesartan (AVAPRO) 75 MG tablet Take 75 mg by mouth daily.    . isosorbide mononitrate (IMDUR) 60 MG 24 hr tablet Take 60 mg by mouth daily.    . Naproxen Sodium (ALEVE PO) Take 1-2 tablets by mouth every 12 (twelve) hours as needed (for leg pain).    . rosuvastatin (CRESTOR) 40 MG tablet Take 40 mg by mouth daily.    . sertraline (ZOLOFT) 100 MG tablet Take 100 mg by mouth daily.     No current  facility-administered medications on file prior to visit.    SLEEP ARCHITECTURE:  Total recording time: 415 minutes.  Total sleep time was: 293 minutes.  Sleep efficiency: 70.7%.  Sleep latency: 10 minutes.  REM latency: 63.5 minutes.  Stage N1: 16.7%.  Stage N2: 44.1%.  Stage N3: 0%.  Stage R:  39.2%.  Supine sleep: 90.5 minutes.  Non-supine sleep: 203 minutes.  CARDIAC DATA:  Average heart rate: 67 beats per minute. Rhythm strip: sinus rhythm with occasional PVC's.  RESPIRATORY DATA: Average respiratory rate: 18. Snoring: moderate.   She was started on CPAP 5 and increase to 10 cm H2O.  She initial had good control of sleep apnea with CPAP 10, but then developed frequent central apneas when in supine sleep.  This was not improved with increase in CPAP settings up to 14 cm H2O.  She was then transitioned to BiPAP.  She was started on BiPAP 16/12 and increased to 22/15 cm H2O.  With BiPAP of 20/14 cm H2O her AHI was reduced to 2.9.  At this pressure she was observed in REM sleep.  MOVEMENT/PARASOMNIA:  Periodic limb movement: 0.  Period limb movements with arousals: 0. Restroom trips: two.  OXYGEN DATA:  Baseline oxygenation: 96%. Lowest SaO2: 80%. Time spent below SaO2  88%: 62.3 minutes. Supplemental oxygen used: none.  IMPRESSION/ RECOMMENDATION:   She was not able to achieve adequate titration using CPAP.  She developed central apneas while using CPAP, and these were not resolved with adjustment to CPAP settings.  She was eventually titrated successfully using BiPAP.  With BiPAP at 20/14 cm H2O she had good control of both obstructive and central sleep apnea, and reasonable control of her oxygenation.  She was fitted with a Fisher Paykel small size Simplus full face mask.  Chesley Mires, M.D. Diplomate, Tax adviser of Sleep Medicine  ELECTRONICALLY SIGNED ON:  03/18/2014, 8:44 AM [facility] PH: [phone]   FX: [phone] [place]

## 2014-03-19 NOTE — Telephone Encounter (Signed)
Spoke with patient- she is aware of set up needed. Aware that PCC's will contact her with this order. Pt is currently on O2 at night as well. I was able to reach patient on her mobile number.

## 2014-03-20 ENCOUNTER — Other Ambulatory Visit: Payer: Self-pay

## 2014-03-20 MED ORDER — ISOSORBIDE MONONITRATE ER 60 MG PO TB24: 60.0000 mg | ORAL_TABLET | Freq: Every day | ORAL | Status: DC

## 2014-03-26 ENCOUNTER — Telehealth: Payer: Self-pay | Admitting: Pulmonary Disease

## 2014-03-26 NOTE — Telephone Encounter (Signed)
Pt is currently on o2 at 2 lpm. When BiPap was ordered on 03/18/14, 02 wasn't indicated on the order if o2 was to be bled into BiPap.  Please clarify order if o2 is needed with bipap. Pt is scheduled to p/u her BiPap on 03/28/14.  If o2 is needed with BiPap a new DME order will need to be placed indicating this. Rhonda J Cobb

## 2014-03-26 NOTE — Telephone Encounter (Signed)
Chesley Mires, MD at 03/18/2014 8:56 AM     Status: Signed       Expand All Collapse All   BiPAP titration 03/17/14 >> BiPAP 20/14 cm H2O >> AHI 2.9, +R. Centrals with CPAP. Will have my nurse inform pt that she did well with BiPAP once this was adjusted to pressure of 20/14 cm H2O. I have sent order to get BiPAP set up through DME. She will need ROV 2 months after starting BiPAP.    ---  Dr. Halford Chessman please advise if pt also needs O2 blended with BIPAP as well? thanks

## 2014-03-27 ENCOUNTER — Telehealth: Payer: Self-pay | Admitting: Internal Medicine

## 2014-03-27 NOTE — Telephone Encounter (Signed)
Order to bleed in oxygen was not include because she did not need supplemental oxygen at night once she got to adequate BiPAP setting.  This was in the sleep study report.  She just needs to use BiPAP at night.

## 2014-03-27 NOTE — Telephone Encounter (Signed)
I spoke with [name] and advised her of this. She will notify the DME. [place] Bing, CMA

## 2014-03-27 NOTE — Telephone Encounter (Signed)
Noted. Also advised [facility] that no o2 was needed with BiPap per Dr. Halford Chessman. See previous note note. Send staff message to Darlina Guys and she replied back that she has relayed this to [facility] RT Dept. Nothing else needed at this time. Rhonda J Cobb

## 2014-04-13 DIAGNOSIS — J0110 Acute frontal sinusitis, unspecified: Secondary | ICD-10-CM | POA: Diagnosis not present

## 2014-06-02 ENCOUNTER — Ambulatory Visit (INDEPENDENT_AMBULATORY_CARE_PROVIDER_SITE_OTHER): Payer: PPO | Admitting: Pulmonary Disease

## 2014-06-02 ENCOUNTER — Encounter: Payer: Self-pay | Admitting: Pulmonary Disease

## 2014-06-02 VITALS — BP 118/58 | HR 71 | Temp 98.1°F | Ht 65.25 in | Wt 233.6 lb

## 2014-06-02 DIAGNOSIS — G4733 Obstructive sleep apnea (adult) (pediatric): Secondary | ICD-10-CM

## 2014-06-02 NOTE — Patient Instructions (Signed)
Will change BiPAP settings to 16/12 cm H2O Will get report from BiPAP 1 month after pressure change Follow up in 6 months

## 2014-06-02 NOTE — Progress Notes (Signed)
Chief Complaint  Patient presents with  . Follow-up    Wears BiPAP nightly. Pt states that she was having problems with mask fit--reports this being okay now. Feels that pressure setting may be too high-blows a lot of air into mouth.     History of Present Illness: [name] is a 74 y.o. female former smoker with severe OSA.  She has been doing much better since starting BiPAP.  She is sleeping so much better, and has much more energy during the day.  She is able to do all the activities now that she was too tired to do before.  She does not have any issues with mask fit.  Her main concern is that she is getting too much pressure from the machine.  TESTS: PSG 02/04/14 >> AHI 142.3, SaO2 low 82% BiPAP titration 03/17/14 >> BiPAP 20/14 cm H2O >> AHI 2.9, +R. Centrals with CPAP. BiPAP 04/01/14 to 04/30/14 >> used on 24 of 30 nights with average 5 hrs and 26 min.  Average AHI is 1.2 with BiPAP 20/14 cm H2O.  Past medical hx >> CAD, PVD, HLD, HTN, Asthma, DM, Breast cancer, Depression  Past surgical hx, Medications, Allergies, Family hx, Social hx all reviewed.   Physical Exam: Blood pressure 118/58, pulse 71, temperature 98.1 F (36.7 C), temperature source Oral, height 5' 5.25" (1.657 m), weight 233 lb 9.6 oz (105.96 kg), SpO2 92 %. Body mass index is 38.59 kg/(m^2).  General - No distress ENT - No sinus tenderness, no oral exudate, no LAN, MP 3, scalloped tongue Cardiac - s1s2 regular, no murmur Chest - No wheeze/rales/dullness Back - No focal tenderness Abd - Soft, non-tender Ext - No edema Neuro - Normal strength Skin - No rashes Psych - normal mood, and behavior   Assessment/Plan:  Severe obstructive sleep apnea. I have reviewed the recent sleep study results with the patient.  We discussed how sleep apnea can affect various health problems including risks for hypertension, cardiovascular disease, and diabetes.  We also discussed how sleep disruption can increase  risks for accident, such as while driving.  Weight loss as a means of improving sleep apnea was also reviewed.  Additional treatment options discussed were CPAP therapy, oral appliance, and surgical intervention.  She is much improved with BiPAP and is compliant with therapy. Plan: - will decrease her BiPAP to 16/12 cm H2O and see if this makes her device easier to use - will get download one month after changing BiPAP setting   Chesley Mires, MD [place] [facility] Pager:  [phone]

## 2014-06-04 ENCOUNTER — Telehealth: Payer: Self-pay | Admitting: Pulmonary Disease

## 2014-06-04 NOTE — Telephone Encounter (Signed)
Pt calling to give some updates from questions she could not answer at most recent OV: Pt calling to give mail order pharmacy that insurance requires her use.  Pt states that her O2 was originally ordered by Dr Hulan Fess-- pt is going to keep this for a while longer. Pt states that she has not had the flu shot for 2015-- plans to get this at PCP next week.  Nothing further needed.

## 2014-06-13 ENCOUNTER — Ambulatory Visit: Payer: Medicare Other | Admitting: Interventional Cardiology

## 2014-07-18 ENCOUNTER — Telehealth: Payer: Self-pay | Admitting: Pulmonary Disease

## 2014-07-18 NOTE — Telephone Encounter (Signed)
I have no idea why she in oxygen.  She should d/w Dr. Rex Kras.  If she is on oxygen related to her OSA, then her oxygen can be d/c'ed.

## 2014-07-18 NOTE — Telephone Encounter (Signed)
I had discussed this with the patient, that Dr Rex Kras was her original prescriber for the O2 and that this will probably have to D/C by him, not our office. Pt states that she does not wear this at night and only used it during the daytime PRN. Pt has not used in a long time.   Will need to discuss this again with the patient and she is going to contact Dr Rex Kras to D/C O2.

## 2014-07-18 NOTE — Telephone Encounter (Signed)
Spoke with patient, states that she would like to D/C O2.  O2 was originally Rxd by Dr Hulan Fess.  Please advise if you can D/C this for the patient or if order will have to be sent by Dr Rex Kras.  Thanks.

## 2014-07-21 NOTE — Telephone Encounter (Signed)
Pt is aware that she needs to contact Dr. Rex Kras about d/c oxygen. She verbalized understanding. Nothing further was needed.

## 2014-08-11 ENCOUNTER — Telehealth: Payer: Self-pay | Admitting: Pulmonary Disease

## 2014-08-11 ENCOUNTER — Telehealth: Payer: Self-pay | Admitting: Interventional Cardiology

## 2014-08-11 NOTE — Telephone Encounter (Signed)
Walk-in form patients need new Rx [name] will give to [name] on Tuesday 08/12/2014.

## 2014-08-11 NOTE — Telephone Encounter (Signed)
Spoke with patient-states she has noticed more trouble with her breathing recently. Pt is having low O2 levels down to 88% RA while sitting or walking and wants to be checked out. Pt continues to wear her BiPAP as well. Appt set for SN tomorrow morning at 9:00am and patient is aware. Nothing more needed at this time.

## 2014-08-12 ENCOUNTER — Telehealth: Payer: Self-pay | Admitting: Pulmonary Disease

## 2014-08-12 ENCOUNTER — Encounter (INDEPENDENT_AMBULATORY_CARE_PROVIDER_SITE_OTHER): Payer: Self-pay

## 2014-08-12 ENCOUNTER — Ambulatory Visit (INDEPENDENT_AMBULATORY_CARE_PROVIDER_SITE_OTHER): Payer: PPO | Admitting: Pulmonary Disease

## 2014-08-12 ENCOUNTER — Encounter: Payer: Self-pay | Admitting: Pulmonary Disease

## 2014-08-12 VITALS — BP 144/62 | HR 53 | Temp 97.9°F | Ht 64.0 in | Wt 232.8 lb

## 2014-08-12 DIAGNOSIS — E118 Type 2 diabetes mellitus with unspecified complications: Secondary | ICD-10-CM

## 2014-08-12 DIAGNOSIS — E785 Hyperlipidemia, unspecified: Secondary | ICD-10-CM

## 2014-08-12 DIAGNOSIS — I2510 Atherosclerotic heart disease of native coronary artery without angina pectoris: Secondary | ICD-10-CM

## 2014-08-12 DIAGNOSIS — G4733 Obstructive sleep apnea (adult) (pediatric): Secondary | ICD-10-CM | POA: Diagnosis not present

## 2014-08-12 DIAGNOSIS — I10 Essential (primary) hypertension: Secondary | ICD-10-CM | POA: Diagnosis not present

## 2014-08-12 DIAGNOSIS — Z853 Personal history of malignant neoplasm of breast: Secondary | ICD-10-CM

## 2014-08-12 DIAGNOSIS — R0600 Dyspnea, unspecified: Secondary | ICD-10-CM

## 2014-08-12 MED ORDER — MOMETASONE FURO-FORMOTEROL FUM 100-5 MCG/ACT IN AERO: 2.0000 | INHALATION_SPRAY | Freq: Two times a day (BID) | RESPIRATORY_TRACT | Status: DC

## 2014-08-12 MED ORDER — IRBESARTAN 75 MG PO TABS: 75.0000 mg | ORAL_TABLET | Freq: Every day | ORAL | Status: DC

## 2014-08-12 NOTE — Progress Notes (Signed)
Subjective:     Patient ID: [name], female   DOB: 08-24-1940, 74 y.o.   MRN: [id number]  HPI 74 y/o WF, pt of DrSood- former smoker w/ severe OSA>   ~  06/02/14: OV w/ VS>  She has been doing much better since starting BiPAP.  She is sleeping so much better, and has much more energy during the day.  She is able to do all the activities now that she was too tired to do before. She does not have any issues with mask fit.  Her main concern is that she is getting too much pressure from the machine. TESTS: Last CXR 05/25/11 at [facility] showed norm heart size, surg clips in left axilla, clear lungs x scarring left base, DJD Tspine... PFT 12/12/12 showed FVC=2.64 (90%), FEV1=2.22 (101%), %1sec=84, mid-flows were wnl as well;  Lung volumes w/ sl air trapping & DLCO= 68% predicted... PSG 02/04/14 >> AHI 142.3, SaO2 low 82% BiPAP titration 03/17/14 >> BiPAP 20/14 cm H2O >> AHI 2.9, +R. Centrals with CPAP. BiPAP 04/01/14 to 04/30/14 >> used on 24 of 30 nights with average 5 hrs and 26 min.  Average AHI is 1.2 with BiPAP 20/14 cm H2O.  ~  08/12/14: Add-on appt w/ SN>  78 y/o WF pt of DrSood w/ OSA on Bipap nightly & prev on O2 but wanted to DC it 3/16 (was not using it at night & only prn during the day & didn't think she needed it- DrLittle had ordered it & he stopped it 3/16 at pt's request); since then she has noted increased SOB & lower O2 sats on her home oximeter checks (down to 88% she says), assoc w/ DOE & leg weakness; now she wants O2 back again...  She is an ex-smoker having started in her teens (1950s), up to 2ppd, and quit in 2005, w/ est ~60 pack-yr smoking hx... Current meds> on Astelin nasal spray but she is off prev Combivent inhaler... EXAM shows Obese w/ wt=233#, 5'4" tall, BMI=40;  Afeb, VSS, O2sat=92% on RA at rest;  HEENT- mallampati 4;  Chest- decr BS bilat w/ few rales at bases;  Heart- RR, Gr1/6 SEM w/o gallops;  Abd- large abd wall hernia...  Repeat Spirometry 08/2014 shows  FVC=2.02 (69%), FEV1=1.51 (70%), %1sec=75, mid-flows are sl reduced at 71% predicted...  Ambulatory O2 sat test>  O2sat=91% on RA at rest w/ pulse=55/min; she walked 1 lap and dropped to 86% w/ pulse=95/min...  IMP/ PLAN>>  She demonstrates oxygen desaturation w/ exercise so we will re-order O2 at 2L/min w/ activity; she will continue her BiPAP nightly & f/u w/ DrSood; we also rec that she start DULERA100-2spBid as a trial...   Past Medical History  Diagnosis Date  . Hyperlipidemia   . Asthma   . DM (diabetes mellitus) - followed by DrKLittle...   . Osteopenia   . Breast cancer   . Hypertension   . Depression   . PVD (peripheral vascular disease)   . Coronary atherosclerosis - followed by DrHSmith...    . OSA (obstructive sleep apnea) 01/20/2014   Past Surgical History  Procedure Laterality Date  . Appendectomy    . Cholecystectomy    . Cyst removal neck    . Breast lumpectomy      1992 on left, 2007 on right   Outpatient Encounter Prescriptions as of 08/12/2014  Medication Sig  . albuterol-ipratropium (COMBIVENT) 18-103 MCG/ACT inhaler Inhale 2 puffs into the lungs every 6 (six) hours as needed for wheezing.  Marland Kitchen  aspirin 81 MG tablet Take 81 mg by mouth daily.  Marland Kitchen azelastine (ASTELIN) 137 MCG/SPRAY nasal spray   . cholecalciferol (VITAMIN D) 1000 UNITS tablet Take 2,000 Units by mouth daily.  . hydrochlorothiazide (HYDRODIURIL) 25 MG tablet Take 25 mg by mouth daily as needed (swelling). As needed  . insulin glargine (LANTUS) 100 UNIT/ML injection 25 units Q PM, 35 units Q AM  . insulin lispro (HUMALOG) 100 UNIT/ML injection Inject 25-30 Units into the skin 3 (three) times daily before meals. Dose adjusted by carb count and blood sugar levels  . irbesartan (AVAPRO) 75 MG tablet Take 75 mg by mouth daily.  . isosorbide mononitrate (IMDUR) 60 MG 24 hr tablet Take 1 tablet (60 mg total) by mouth daily.  . Naproxen Sodium (ALEVE PO) Take 1-2 tablets by mouth every 12 (twelve) hours as  needed (for leg pain).  . rosuvastatin (CRESTOR) 40 MG tablet Take 40 mg by mouth daily.  . sertraline (ZOLOFT) 100 MG tablet Take 100 mg by mouth daily.   Allergies  Allergen Reactions  . Amoxicillin Other (See Comments)    REACTION: gi upset  . Codeine   . Diclofenac Sodium Hives  . Dye Fdc Red  [Red Dye]   . Erythromycin Diarrhea    REACTION: gi upset  . Glipizide Other (See Comments)    REACTION: gi upset  . Metformin Diarrhea    REACTION: gi upset  . Metformin And Related   . Methocarbamol Other (See Comments)    REACTION: tachycardia  . Niacin Hives    REACTION: rash and sob  . Penicillins   . Ramipril   . Sulfa Antibiotics   . Tetanus Immune Globulin   . Voltaren  [Diclofenac]   . Tetanus Toxoid Itching, Swelling and Rash    REACTION: swelling   Current Medications, Allergies, Past Medical History, Past Surgical History, Family History, and Social History were reviewed in [employer] record.   Review of Systems            All symptoms NEG, except where BOLDED >>  Constitutional:  Denies F/C/S, anorexia, unexpected weight change. HEENT:  +HA, visual changes, earache, nasal symptoms, sore throat, hoarseness. Resp:  cough, sputum, hemoptysis; SOB, tightness, wheezing. Cardio:  CP, palpit, DOE, orthopnea, edema. GI:  Denies N/V/D/C or blood in stool; no reflux, abd pain & abd wall hernia, distention, or gas. GU:  No dysuria, freq, urgency, hematuria, or flank pain. MS:  joint pain, swelling, tenderness, or decr ROM; no neck pain, back pain, etc. Neuro:  No tremors, seizures, dizziness, syncope, weakness, numbness, gait abn. Skin:  No suspicious lesions or skin rash. Heme:  No adenopathy, bruising, bleeding. Psyche: Denies confusion, sleep disturbance, hallucinations, anxiety, depression.   Objective:   Physical Exam    General - No distress ENT - No sinus tenderness, no oral exudate, no LAN, MP 3, scalloped tongue Cardiac - s1s2 regular,  gr 1/6 SEM w/o r/g... Chest - No wheeze/rales/dullness Back - No focal tenderness Abd - Soft, non-tender Ext - No edema Neuro - Normal strength Skin - No rashes Psych - normal mood, and behavior   Assessment:      IMP/ PLAN>>  She demonstrates oxygen desaturation w/ exercise so we will re-order O2 at 2L/min w/ activity; she will continue her BiPAP nightly & f/u w/ DrSood; she agreed to try DULERA100- 2spBid as a trial...  OSA Signif smoking history Morbid Obesity> r/o OHS, r/o restrictive lung dis component... HBP CAD ASPVD HL  DM     Plan:     Patient's Medications  New Prescriptions           MOMETASONE-FORMOTEROL (DULERA) 100-5 MCG/ACT AERO    Inhale 2 puffs into the lungs 2 (two) times daily.           OXYGEN re-ordered at 2L/min by [place]...   Previous Medications   ASPIRIN 81 MG TABLET    Take 81 mg by mouth daily.   AZELASTINE (ASTELIN) 137 MCG/SPRAY NASAL SPRAY       CHOLECALCIFEROL (VITAMIN D) 1000 UNITS TABLET    Take 2,000 Units by mouth daily.   HYDROCHLOROTHIAZIDE (HYDRODIURIL) 25 MG TABLET    Take 25 mg by mouth daily as needed (swelling). As needed   INSULIN GLARGINE (LANTUS) 100 UNIT/ML INJECTION    25 units Q PM, 35 units Q AM   INSULIN LISPRO (HUMALOG) 100 UNIT/ML INJECTION    Inject 25-30 Units into the skin 3 (three) times daily before meals. Dose adjusted by carb count and blood sugar levels   ISOSORBIDE MONONITRATE (IMDUR) 60 MG 24 HR TABLET    Take 1 tablet (60 mg total) by mouth daily.   NAPROXEN SODIUM (ALEVE PO)    Take 1-2 tablets by mouth every 12 (twelve) hours as needed (for leg pain).   ROSUVASTATIN (CRESTOR) 40 MG TABLET    Take 40 mg by mouth daily.   SERTRALINE (ZOLOFT) 100 MG TABLET    Take 100 mg by mouth daily.  Modified Medications   Modified Medication Previous Medication   ALBUTEROL-IPRATROPIUM (COMBIVENT) 18-103 MCG/ACT INHALER albuterol-ipratropium (COMBIVENT) 18-103 MCG/ACT inhaler      Inhale 2 puffs into the lungs every 6 (six)  hours as needed for wheezing.    Inhale 2 puffs into the lungs every 6 (six) hours as needed for wheezing.   IRBESARTAN (AVAPRO) 75 MG TABLET irbesartan (AVAPRO) 75 MG tablet      Take 1 tablet (75 mg total) by mouth daily.    Take 75 mg by mouth daily.  Discontinued Medications   No medications on file

## 2014-08-12 NOTE — Telephone Encounter (Signed)
I spoke with patient earlier today at time of call; pt would like to know if she can either have a refill from SN for Combivent or should she get a different inhaler from SN since he is giving her the sample of Dulera. This is usually a VS patient. Thanks.

## 2014-08-12 NOTE — Telephone Encounter (Signed)
[name] found asthma. Nothing further needed at this time.

## 2014-08-12 NOTE — Telephone Encounter (Signed)
lmtcb X1 for pt  

## 2014-08-12 NOTE — Telephone Encounter (Signed)
[phone] calling back

## 2014-08-12 NOTE — Patient Instructions (Signed)
Today we updated your med list in our EPIC system...    Continue your current medications the same...  Today we did a follow up Spirometry test> you have some mild to mod COPD related to prev smoking...    and Ambulatory Oxygen saturation test> your oxygen dropped to 86% on RA w/ walking...  I rec that you start an inhaler> DULERA-100 2 sprays twice daily (& rinse out well after each treatment)  We will contact a home care company to deliver Oxygen to use 2L/min w/ exercise & at night...  Call for any questions or if we can be of service in any way.Marland KitchenMarland Kitchen

## 2014-08-13 MED ORDER — IPRATROPIUM-ALBUTEROL 18-103 MCG/ACT IN AERO: 2.0000 | INHALATION_SPRAY | Freq: Four times a day (QID) | RESPIRATORY_TRACT | Status: DC | PRN

## 2014-08-13 NOTE — Telephone Encounter (Signed)
Spoke with the pt  She is asking for refill on combivent  She uses combivent as her rescue inhaler  Rx was refilled  Nothing further needed

## 2014-08-14 ENCOUNTER — Telehealth: Payer: Self-pay | Admitting: *Deleted

## 2014-08-14 ENCOUNTER — Other Ambulatory Visit: Payer: Self-pay | Admitting: *Deleted

## 2014-08-14 DIAGNOSIS — J449 Chronic obstructive pulmonary disease, unspecified: Secondary | ICD-10-CM

## 2014-08-14 NOTE — Telephone Encounter (Signed)
Fax from [employer] came for PA on Dulera 123mcg PA was initiated online 5 days for decision which will be faxed to office. Key: [id number] May call [phone] if no response within 5 business days. ICD-10 G47.34

## 2014-08-15 NOTE — Telephone Encounter (Signed)
[name] with [name] calling. Needs to know if pt has dx of asthma. Ph # [phone]

## 2014-08-15 NOTE — Telephone Encounter (Signed)
Returned call back to [place] call went to vmail. Left a message answering her question "yes" patient does have hx of asthma. She can call office back if she has any further questions.

## 2014-08-15 NOTE — Telephone Encounter (Signed)
Called Josph Macho, selected English and Provider line, was put through to a voicemail as no reps were available.

## 2014-08-19 ENCOUNTER — Telehealth: Payer: Self-pay | Admitting: Pulmonary Disease

## 2014-08-19 MED ORDER — IPRATROPIUM-ALBUTEROL 20-100 MCG/ACT IN AERS: 1.0000 | INHALATION_SPRAY | Freq: Four times a day (QID) | RESPIRATORY_TRACT | Status: DC

## 2014-08-19 NOTE — Telephone Encounter (Signed)
Spoke with pt, made her aware that we are currently working on PA for dulera and will update her with the outcome as we have it.

## 2014-08-19 NOTE — Telephone Encounter (Signed)
Combivent 20-134mcg called into [place].  Nothing further needed.

## 2014-08-19 NOTE — Addendum Note (Signed)
Addended by: Virl Cagey on: 08/19/2014 04:09 PM   Modules accepted: Orders

## 2014-08-19 NOTE — Telephone Encounter (Signed)
Received a fax from [place] requesting that change of Combivent Inhaler dose.  Pt was prescribed 18-157mcg inhaler and they only carry the 20-160mcg dose. Dr Halford Chessman please advise if okay to switch dose of inhaler. Thanks.

## 2014-08-19 NOTE — Telephone Encounter (Signed)
Okay to change dose?  °

## 2014-08-19 NOTE — Telephone Encounter (Signed)
Patient is calling to see what is going on with her getting Dulera, please cb at [phone]

## 2014-08-20 NOTE — Telephone Encounter (Signed)
PA for Ruthe Mannan has been denied Pt must have dx of asthma.

## 2014-08-26 ENCOUNTER — Encounter: Payer: Self-pay | Admitting: Interventional Cardiology

## 2014-08-26 ENCOUNTER — Telehealth: Payer: Self-pay | Admitting: Pulmonary Disease

## 2014-08-26 ENCOUNTER — Ambulatory Visit (INDEPENDENT_AMBULATORY_CARE_PROVIDER_SITE_OTHER): Payer: PPO | Admitting: Interventional Cardiology

## 2014-08-26 VITALS — BP 120/50 | HR 71 | Ht 65.0 in | Wt 228.8 lb

## 2014-08-26 DIAGNOSIS — G4733 Obstructive sleep apnea (adult) (pediatric): Secondary | ICD-10-CM

## 2014-08-26 DIAGNOSIS — I872 Venous insufficiency (chronic) (peripheral): Secondary | ICD-10-CM

## 2014-08-26 DIAGNOSIS — I2510 Atherosclerotic heart disease of native coronary artery without angina pectoris: Secondary | ICD-10-CM | POA: Diagnosis not present

## 2014-08-26 DIAGNOSIS — I8311 Varicose veins of right lower extremity with inflammation: Secondary | ICD-10-CM | POA: Diagnosis not present

## 2014-08-26 DIAGNOSIS — I10 Essential (primary) hypertension: Secondary | ICD-10-CM | POA: Diagnosis not present

## 2014-08-26 DIAGNOSIS — J449 Chronic obstructive pulmonary disease, unspecified: Secondary | ICD-10-CM | POA: Insufficient documentation

## 2014-08-26 NOTE — Telephone Encounter (Signed)
Called [facility] and PA still pending They apologized but have just experience high volumes. It may be a couple more days before we find out decision. Pt states she is about to run of Dulera 100 and needs another sample until we get decision. She is going to contact [place] office as I told her I am working out of the [employer].

## 2014-08-26 NOTE — Patient Instructions (Signed)
Medication Instructions:  Your physician recommends that you continue on your current medications as directed. Please refer to the Current Medication list given to you today.   Labwork: None   Testing/Procedures: None   Follow-Up: Your physician wants you to follow-up in: 1 year with Dr.Smith You will receive a reminder letter in the mail two months in advance. If you don't receive a letter, please call our office to schedule the follow-up appointment.   Any Other Special Instructions Will Be Listed Below (If Applicable). 1)Purchase over the counter hydrocortisone 1% cream and apply it to affected area as needed. 2)Elevate your legs when sitting. 3)Your physician discussed the importance of regular exercise and recommended that you start or continue a regular exercise program for good health.

## 2014-08-26 NOTE — Telephone Encounter (Signed)
Combivent inhaler was prescribed, did this take the place of Dulera that Dr. Lenna Gilford prescribed? Ruthe Mannan was denied because patient must have diagnosis of asthma.

## 2014-08-26 NOTE — Progress Notes (Signed)
Cardiology Office Note   Date:  08/26/2014   ID:  [name] [place], DOB October 11, 1940, MRN [id number]  PCP:  Gennette Pac, MD  Cardiologist:   [name], MD   Chief Complaint  Patient presents with  . Hypertension  . Hyperlipidemia      History of Present Illness: [name] is a 74 y.o. female who presents for coronary artery disease (high-grade distal RCA collateralized left), hypertension, obstructive sleep apnea, venous insufficiency with stasis dermatitis, right lower extremity  The patient returns for yearly follow-up of coronary disease. She is doing well. She denies angina. She has had a dramatic improvement in her overall functional status since starting last C Pap for obstructive sleep apnea. She is now able to ambulate without oxygen. She is able to cook all meals. Her mental acuity has dramatically improved.    Past Medical History  Diagnosis Date  . Hyperlipidemia   . Asthma   . DM (diabetes mellitus)   . Osteopenia   . Breast cancer   . Hypertension   . Depression   . PVD (peripheral vascular disease)   . Coronary atherosclerosis   . OSA (obstructive sleep apnea) 01/20/2014    Past Surgical History  Procedure Laterality Date  . Appendectomy    . Cholecystectomy    . Cyst removal neck    . Breast lumpectomy      1992 on left, 2007 on right     Current Outpatient Prescriptions  Medication Sig Dispense Refill  . aspirin 81 MG tablet Take 81 mg by mouth daily.    Marland Kitchen azelastine (ASTELIN) 137 MCG/SPRAY nasal spray     . cholecalciferol (VITAMIN D) 1000 UNITS tablet Take 2,000 Units by mouth daily.    . hydrochlorothiazide (HYDRODIURIL) 25 MG tablet Take 25 mg by mouth daily as needed (swelling). As needed    . insulin glargine (LANTUS) 100 UNIT/ML injection 25 units Q PM, 35 units Q AM    . insulin lispro (HUMALOG) 100 UNIT/ML injection Inject 25-30 Units into the skin 3 (three) times daily before meals. Dose adjusted by carb count  and blood sugar levels    . Ipratropium-Albuterol (COMBIVENT RESPIMAT) 20-100 MCG/ACT AERS respimat Inhale 1 puff into the lungs every 6 (six) hours. 1 Inhaler 2  . irbesartan (AVAPRO) 75 MG tablet Take 1 tablet (75 mg total) by mouth daily. 30 tablet 3  . isosorbide mononitrate (IMDUR) 60 MG 24 hr tablet Take 1 tablet (60 mg total) by mouth daily. 30 tablet 6  . mometasone-formoterol (DULERA) 100-5 MCG/ACT AERO Inhale 2 puffs into the lungs 2 (two) times daily. 1 Inhaler 2  . Naproxen Sodium (ALEVE PO) Take 1-2 tablets by mouth every 12 (twelve) hours as needed (for leg pain).    . rosuvastatin (CRESTOR) 40 MG tablet Take 40 mg by mouth daily.    . sertraline (ZOLOFT) 100 MG tablet Take 100 mg by mouth daily.    . mometasone-formoterol (DULERA) 100-5 MCG/ACT AERO Inhale 2 puffs into the lungs 2 (two) times daily. 1 Inhaler 0   No current facility-administered medications for this visit.    Allergies:   Amoxicillin; Codeine; Diclofenac sodium; Dye fdc red ; Erythromycin; Glipizide; Metformin; Metformin and related; Methocarbamol; Niacin; Penicillins; Ramipril; Sulfa antibiotics; Tetanus immune globulin; Voltaren ; and Tetanus toxoid    Social History:  The patient  reports that she quit smoking about 11 years ago. Her smoking use included Cigarettes. She has a 40 pack-year smoking history. She  has never used smokeless tobacco. She reports that she does not drink alcohol or use illicit drugs.   Family History:  The patient's  Excessive daytime sleepiness has resolved  family history includes Heart disease in her father and mother.    ROS:  Please see the history of present illness.   Otherwise, review of systems are positive for right lower extremity swelling and erythema.   All other systems are reviewed and negative.    PHYSICAL EXAM: VS:  BP 120/50 mmHg  Pulse 71  Ht 5\' 5"  (1.651 m)  Wt 228 lb 12.8 oz (103.783 kg)  BMI 38.07 kg/m2 , BMI Body mass index is 38.07 kg/(m^2). GEN: Well  nourished, well developed, in no acute distress HEENT: normal Neck: no JVD, carotid bruits, or masses Cardiac: RRR; no murmurs, rubs, or gallops.  The right lower extremity reveals dramatic varicosities over the right calf. There is 1+ right lower extremity edema. There is erythema/inflammation in the skin Respiratory:  clear to auscultation bilaterally, normal work of breathing GI: soft, nontender, nondistended, + BS MS: no deformity or atrophy Skin: warm and dry, no rash Neuro:  Strength and sensation are intact Psych: euthymic mood, full affect   EKG:  EKG is ordered today. The ekg ordered today demonstrates sinus rhythm with normal EKG pattern   Recent Labs: No results found for requested labs within last 365 days.    Lipid Panel No results found for: CHOL, TRIG, HDL, CHOLHDL, VLDL, LDLCALC, LDLDIRECT    Wt Readings from Last 3 Encounters:  08/26/14 228 lb 12.8 oz (103.783 kg)  08/12/14 232 lb 12.8 oz (105.597 kg)  06/02/14 233 lb 9.6 oz (105.96 kg)      Other studies Reviewed: Additional studies/ records that were reviewed today include: Clinical data from the pulmonary division have been reviewed.. Review of the above records demonstrates: Relevant data found with reference to cardiovascular system   ASSESSMENT AND PLAN:  CAD in native artery:  denies angina  Essential hypertension: Stable  Hyperlipidemia: On therapy  OSA (obstructive sleep apnea)colon dramatic improvement since starting C Pap  Right lower extremity venous insufficiency, with attendant stasis dermatitis. I recommended over-the-counter 1% hydrocortisone cream to be applied twice a day to the dermatitis on the right lower extremity.   Current medicines are reviewed at length with the patient today.  The patient does not have concerns regarding medicines.  The following changes have been made:  no change  Labs/ tests ordered today include:  No orders of the defined types were placed in this  encounter.     Disposition:   FU with Linard Millers in 1 year  Signed, [name], MD  08/26/2014 3:56 PM    [address], [place], [place]  [address] Phone: [phone]; Fax: [phone]

## 2014-08-26 NOTE — Telephone Encounter (Signed)
Called and spoke to pt. Pt stated she will call her PCP to see if they have the Dulera 100, instead of picking up a Dulera 200 here. Pt stated she will call back tomorrow morning if her PCP does not have Dulera 100 then she will need the Dulera 200 from [place].   Will await call.

## 2014-08-26 NOTE — Telephone Encounter (Signed)
Combivent is rescue inhaler not a daily maintenance inhaler.   Rosana Berger, CMA at 08/13/2014 10:27 AM     Status: Signed       Expand All Collapse All   Spoke with the pt  She is asking for refill on combivent  She uses combivent as her rescue inhaler  Rx was refilled  Nothing further needed       Will send to Dr Halford Chessman as Juluis Rainier that [facility] PA denied d/t dx not having a dx of asthma.  Please advise on an alternative. Thanks.

## 2014-08-26 NOTE — Telephone Encounter (Signed)
I have only seen her previously for sleep apnea.  She was seen as an acute visit by Dr. Lenna Gilford for dyspnea, and was prescribed dulera.  Her spirometry from 08/12/14 showed moderate obstruction and she has hx of tobacco abuse >> consistent with COPD.  Can you check if COPD is acceptable dx for insurance to approve dulera >> if not, then send script for Breo 100/25 one puff daily.

## 2014-08-26 NOTE — Telephone Encounter (Signed)
Patient needs samples of Dulera 100 until her PA has been approved.  Patient is almost out of medication.  We do not have any Dulera 100, we have Dulera 200.    Dr. Halford Chessman - what do you recommend?

## 2014-08-26 NOTE — Telephone Encounter (Signed)
Can give her sample of 200 strength of dulera for now, but she should change to 100 strength as soon as she can get her prescription filled.

## 2014-08-27 NOTE — Telephone Encounter (Signed)
Samples of Dulera 200 will be left at the front desk. Pt is aware. Nothing further was needed.

## 2014-08-27 NOTE — Telephone Encounter (Signed)
Pt returned call - [phone] - She states she will pick up [facility] before we close today.

## 2014-09-10 ENCOUNTER — Telehealth: Payer: Self-pay | Admitting: Pulmonary Disease

## 2014-09-10 NOTE — Telephone Encounter (Signed)
Per 08/12/14 acute visit w/ SN: Patient Instructions       Today we updated your med list in our EPIC system...    Continue your current medications the same... Today we did a follow up Spirometry test> you have some mild to mod COPD related to prev smoking...    and Ambulatory Oxygen saturation test> your oxygen dropped to 86% on RA w/ walking... I rec that you start an inhaler> DULERA-100 2 sprays twice daily (& rinse out well after each treatment) We will contact a home care company to deliver Oxygen to use 2L/min w/ exercise & at night... Call for any questions or if we can be of service in any way  ---  Called spoke with pt. She is wanting samples of the dulera 100. i have left this for pick up. Nothing further needed

## 2014-09-10 NOTE — Telephone Encounter (Signed)
I received from from [facility] and completed.  I faxed form along with last office note. If this is denied patient my have to switch to [facility]. Awaiting decision.l

## 2014-09-10 NOTE — Telephone Encounter (Signed)
[name] has been denied. Appeals and denial was faxed to [facility]. I have not received these forms. I asked that they be re-faxed to [place] office. Representative states it was denied but then resubmitted and claim aborted and she is not sure why. Will continue to follow.

## 2014-09-11 ENCOUNTER — Telehealth: Payer: Self-pay | Admitting: Pulmonary Disease

## 2014-09-11 NOTE — Telephone Encounter (Signed)
lmtcb for [employer]

## 2014-09-12 NOTE — Telephone Encounter (Signed)
Called and left detailed message for [name]. This has been an on-going PA since 08/26/14 submitted thru covermymeds.  Key: [id number] It was resubmitted again on 09/10/14 via paper fax. We would like to get this resolved asap.

## 2014-09-13 ENCOUNTER — Encounter: Payer: Self-pay | Admitting: Interventional Cardiology

## 2014-09-15 ENCOUNTER — Other Ambulatory Visit: Payer: Self-pay

## 2014-09-15 MED ORDER — ISOSORBIDE MONONITRATE ER 60 MG PO TB24: 60.0000 mg | ORAL_TABLET | Freq: Every day | ORAL | Status: DC

## 2014-09-15 NOTE — Telephone Encounter (Signed)
Appeals for had not been received from [facility]. Please check fax and have form signed and faxed back.

## 2014-09-15 NOTE — Telephone Encounter (Signed)
RX for Isosorbide 60mg  qd sent to pt pharmacy per my chart request

## 2014-09-15 NOTE — Telephone Encounter (Signed)
Called [facility] today to check the status of PA for [facility] 144mcg. Spoke with

## 2014-09-16 NOTE — Telephone Encounter (Signed)
[place] calling about a prior auth dulera 100-5

## 2014-09-16 NOTE — Telephone Encounter (Signed)
lmtcb for [employer]

## 2014-09-16 NOTE — Telephone Encounter (Signed)
[name] returned call [phone]

## 2014-09-16 NOTE — Telephone Encounter (Signed)
Left message for [name] to call back.

## 2014-09-16 NOTE — Telephone Encounter (Signed)
lmtcb

## 2014-09-17 NOTE — Telephone Encounter (Signed)
Dr. Lenna Gilford Ms. Fischman's insurance will not cover Ruthe Mannan We have been working on this since 08/14/14. They will cover Advair, Breo, Symbicort and Stiolto. Would you like to try one of these/

## 2014-09-17 NOTE — Telephone Encounter (Signed)
Lebron Quam, referance #[id number], please cb at previous number listed

## 2014-09-18 MED ORDER — BUDESONIDE-FORMOTEROL FUMARATE 160-4.5 MCG/ACT IN AERO: 2.0000 | INHALATION_SPRAY | Freq: Two times a day (BID) | RESPIRATORY_TRACT | Status: DC

## 2014-09-18 NOTE — Telephone Encounter (Signed)
Per SN, rx for symbicort 160 2 puffs BID was called in to pharmacy. Called pt and informed of medication change and insurance denial of dulera. Pt stated that she was out of town visiting her daughter and for Probation officer to call script into [facility] in [place]. Rx was called in and nothing further is needed.

## 2014-09-18 NOTE — Telephone Encounter (Signed)
Apolonio Schneiders - has Dr. Lenna Gilford responded to this yet?  Please advise if he wants to try Memory Dance, Adviar, Symbicort or Stiolto in placed of Dulera.  Thanks.

## 2014-09-18 NOTE — Telephone Encounter (Signed)
This has already been addressed. See previous messages. Pt is to start symbicort and is aware of the change. Nothing further is needed.

## 2014-10-10 ENCOUNTER — Other Ambulatory Visit: Payer: Self-pay | Admitting: Family Medicine

## 2014-10-10 DIAGNOSIS — M8580 Other specified disorders of bone density and structure, unspecified site: Secondary | ICD-10-CM

## 2014-10-10 DIAGNOSIS — Z1231 Encounter for screening mammogram for malignant neoplasm of breast: Secondary | ICD-10-CM

## 2014-11-06 ENCOUNTER — Ambulatory Visit
Admission: RE | Admit: 2014-11-06 | Discharge: 2014-11-06 | Disposition: A | Discharge status: Home / Self Care | Payer: PPO | Source: Ambulatory Visit | Attending: Family Medicine | Admitting: Family Medicine

## 2014-11-06 DIAGNOSIS — Z1231 Encounter for screening mammogram for malignant neoplasm of breast: Secondary | ICD-10-CM

## 2014-11-06 DIAGNOSIS — M8580 Other specified disorders of bone density and structure, unspecified site: Secondary | ICD-10-CM

## 2014-11-11 ENCOUNTER — Other Ambulatory Visit: Payer: Self-pay | Admitting: *Deleted

## 2014-11-11 ENCOUNTER — Encounter: Payer: Self-pay | Admitting: Neurology

## 2014-11-11 ENCOUNTER — Ambulatory Visit (INDEPENDENT_AMBULATORY_CARE_PROVIDER_SITE_OTHER): Payer: PPO | Admitting: Neurology

## 2014-11-11 ENCOUNTER — Ambulatory Visit: Payer: PPO | Admitting: *Deleted

## 2014-11-11 VITALS — BP 138/66 | HR 72 | Resp 18 | Ht 65.0 in | Wt 232.0 lb

## 2014-11-11 DIAGNOSIS — R208 Other disturbances of skin sensation: Secondary | ICD-10-CM | POA: Diagnosis not present

## 2014-11-11 DIAGNOSIS — R0989 Other specified symptoms and signs involving the circulatory and respiratory systems: Secondary | ICD-10-CM

## 2014-11-11 DIAGNOSIS — I2510 Atherosclerotic heart disease of native coronary artery without angina pectoris: Secondary | ICD-10-CM | POA: Insufficient documentation

## 2014-11-11 DIAGNOSIS — E669 Obesity, unspecified: Secondary | ICD-10-CM | POA: Insufficient documentation

## 2014-11-11 DIAGNOSIS — I209 Angina pectoris, unspecified: Secondary | ICD-10-CM | POA: Insufficient documentation

## 2014-11-11 DIAGNOSIS — E119 Type 2 diabetes mellitus without complications: Secondary | ICD-10-CM | POA: Insufficient documentation

## 2014-11-11 DIAGNOSIS — IMO0002 Reserved for concepts with insufficient information to code with codable children: Secondary | ICD-10-CM | POA: Insufficient documentation

## 2014-11-11 DIAGNOSIS — C50919 Malignant neoplasm of unspecified site of unspecified female breast: Secondary | ICD-10-CM | POA: Insufficient documentation

## 2014-11-11 DIAGNOSIS — J449 Chronic obstructive pulmonary disease, unspecified: Secondary | ICD-10-CM | POA: Insufficient documentation

## 2014-11-11 DIAGNOSIS — M899 Disorder of bone, unspecified: Secondary | ICD-10-CM | POA: Insufficient documentation

## 2014-11-11 DIAGNOSIS — F32A Depression, unspecified: Secondary | ICD-10-CM | POA: Insufficient documentation

## 2014-11-11 DIAGNOSIS — E7800 Pure hypercholesterolemia, unspecified: Secondary | ICD-10-CM | POA: Insufficient documentation

## 2014-11-11 DIAGNOSIS — F329 Major depressive disorder, single episode, unspecified: Secondary | ICD-10-CM | POA: Insufficient documentation

## 2014-11-11 DIAGNOSIS — I10 Essential (primary) hypertension: Secondary | ICD-10-CM | POA: Insufficient documentation

## 2014-11-11 DIAGNOSIS — I809 Phlebitis and thrombophlebitis of unspecified site: Secondary | ICD-10-CM | POA: Insufficient documentation

## 2014-11-11 DIAGNOSIS — E1165 Type 2 diabetes mellitus with hyperglycemia: Secondary | ICD-10-CM | POA: Insufficient documentation

## 2014-11-11 DIAGNOSIS — M79672 Pain in left foot: Secondary | ICD-10-CM | POA: Diagnosis not present

## 2014-11-11 DIAGNOSIS — D126 Benign neoplasm of colon, unspecified: Secondary | ICD-10-CM | POA: Insufficient documentation

## 2014-11-11 DIAGNOSIS — E782 Mixed hyperlipidemia: Secondary | ICD-10-CM | POA: Insufficient documentation

## 2014-11-11 DIAGNOSIS — M949 Disorder of cartilage, unspecified: Secondary | ICD-10-CM

## 2014-11-11 DIAGNOSIS — E1151 Type 2 diabetes mellitus with diabetic peripheral angiopathy without gangrene: Secondary | ICD-10-CM | POA: Insufficient documentation

## 2014-11-11 DIAGNOSIS — I739 Peripheral vascular disease, unspecified: Secondary | ICD-10-CM | POA: Insufficient documentation

## 2014-11-11 DIAGNOSIS — R079 Chest pain, unspecified: Secondary | ICD-10-CM | POA: Insufficient documentation

## 2014-11-11 MED ORDER — KETOROLAC TROMETHAMINE 30 MG/ML IJ SOLN
60.0000 mg | Freq: Once | INTRAMUSCULAR | Status: AC
  Administered 2014-11-11: 60 mg via INTRAMUSCULAR

## 2014-11-11 MED ORDER — METHYLPREDNISOLONE 4 MG PO TBPK: ORAL_TABLET | ORAL | Status: DC

## 2014-11-11 NOTE — Progress Notes (Signed)
[facility]  PATIENT: [name] DOB: 18-Mar-1941  REFERRING DOCTOR OR PCP:  Hulan Fess SOURCE: patient and records from PCP's office  _________________________________   HISTORICAL  CHIEF COMPLAINT:  Chief Complaint  Patient presents with  . Numbness    Sts. on 09-17-14, she  rolled her left ankle outwards while stepping off of a train.  She was seen at [facility] in [place], [place]. x-rays were negative for fx.  Sts. later that night, foot was very swollen, red.  This has resolved, but she continues to c/o intermittent numbness/shooting pain to left foot./fim    HISTORY OF PRESENT ILLNESS:  I had the pleasure of seeing your patient, [name], at [facility] for neurologic consultation regarding her left foot pain and numbness.   As you know, she is a 74 year old woman who injured her ankle while stepping off of a train on 09/17/2014.  She landed on the left foot wrong and fell.  She went to [facility] in [place] and had x-rays of the ankle and was told that they were negative for fractures.   The foot began to swell up that day and has continued to be swollen, increasing daily as the day goes on.   When she fell, she felt a shock like sensation going up the foot to the leg and hip.   She wore a boot x 4 weeks but felt worse with the boot and had more swelling.    Foot was very red for a few weeks.  She has not seen orthopedics or podiatry.      Currently, the worse pain is located above the toes in the lower foot.    She also has a tight sensation in the foot.   She denies tingling and gets some intermittent numb sensations.   She denies weakness.   She denies pain or weakness in the right foot.   She notes chronic left knee pain (no change since incident).    The swelling never completely resolved though she feels there is less swelling and less redness than there used to be.   Pain increases when she walks on the  foot. Pain is best after she sleeps overnight.    She has peripheral vascular disease and coronary artery disease. She reports having vascular studies in the legs earlier this year.  REVIEW OF SYSTEMS: Constitutional: No fevers, chills, sweats, or change in appetite Eyes: No visual changes, double vision, eye pain Ear, nose and throat: No hearing loss, ear pain, nasal congestion, sore throat Cardiovascular: No chest pain, palpitations.   Has CAD Respiratory: No shortness of breath at rest or with exertion.   No wheezes.  HAs OSA GastrointestinaI: No nausea, vomiting, diarrhea, abdominal pain, fecal incontinence Genitourinary: No dysuria, urinary retention or frequency.  No nocturia. Musculoskeletal: No neck pain, back pain Integumentary: No rash, pruritus, skin lesions Neurological: as above Psychiatric: No depression at this time.  No anxiety Endocrine: No palpitations, diaphoresis, change in appetite.  Has Insulin dependent Type 2 DM Hematologic/Lymphatic: No anemia, purpura, petechiae. Allergic/Immunologic: No itchy/runny eyes, nasal congestion, recent allergic reactions, rashes  ALLERGIES: Allergies  Allergen Reactions  . Amoxicillin Other (See Comments)    REACTION: gi upset  . Codeine   . Diclofenac Sodium Hives  . Dye Fdc Red  [Red Dye]   . Erythromycin Diarrhea    REACTION: gi upset  . Glipizide Other (See Comments)    REACTION: gi upset  . Metformin Diarrhea  REACTION: gi upset  . Metformin And Related   . Methocarbamol Other (See Comments)    REACTION: tachycardia  . Niacin Hives    REACTION: rash and sob  . Penicillins   . Ramipril   . Sulfa Antibiotics   . Tetanus Immune Globulin   . Voltaren  [Diclofenac]   . Tetanus Toxoid Itching, Swelling and Rash    REACTION: swelling    HOME MEDICATIONS:  Current outpatient prescriptions:  .  aspirin 81 MG tablet, Take 81 mg by mouth daily., Disp: , Rfl:  .  azelastine (ASTELIN) 137 MCG/SPRAY nasal spray, ,  Disp: , Rfl:  .  budesonide-formoterol (SYMBICORT) 160-4.5 MCG/ACT inhaler, Inhale 2 puffs into the lungs 2 (two) times daily., Disp: 1 Inhaler, Rfl: 5 .  cholecalciferol (VITAMIN D) 1000 UNITS tablet, Take 2,000 Units by mouth daily., Disp: , Rfl:  .  hydrochlorothiazide (HYDRODIURIL) 25 MG tablet, Take 25 mg by mouth daily as needed (swelling). As needed, Disp: , Rfl:  .  insulin glargine (LANTUS) 100 UNIT/ML injection, 25 units Q PM, 35 units Q AM, Disp: , Rfl:  .  insulin lispro (HUMALOG) 100 UNIT/ML injection, Inject 25-30 Units into the skin 3 (three) times daily before meals. Dose adjusted by carb count and blood sugar levels, Disp: , Rfl:  .  Ipratropium-Albuterol (COMBIVENT RESPIMAT) 20-100 MCG/ACT AERS respimat, Inhale 1 puff into the lungs every 6 (six) hours., Disp: 1 Inhaler, Rfl: 2 .  irbesartan (AVAPRO) 75 MG tablet, Take 1 tablet (75 mg total) by mouth daily., Disp: 30 tablet, Rfl: 3 .  isosorbide mononitrate (IMDUR) 60 MG 24 hr tablet, Take 1 tablet (60 mg total) by mouth daily., Disp: 90 tablet, Rfl: 3 .  Naproxen Sodium (ALEVE PO), Take 1-2 tablets by mouth every 12 (twelve) hours as needed (for leg pain)., Disp: , Rfl:  .  rosuvastatin (CRESTOR) 40 MG tablet, Take 40 mg by mouth daily., Disp: , Rfl:  .  sertraline (ZOLOFT) 100 MG tablet, Take 100 mg by mouth daily., Disp: , Rfl:  .  mometasone-formoterol (DULERA) 100-5 MCG/ACT AERO, Inhale 2 puffs into the lungs 2 (two) times daily. (Patient not taking: Reported on 11/11/2014), Disp: 1 Inhaler, Rfl: 2 .  mometasone-formoterol (DULERA) 100-5 MCG/ACT AERO, Inhale 2 puffs into the lungs 2 (two) times daily., Disp: 1 Inhaler, Rfl: 0  PAST MEDICAL HISTORY: Past Medical History  Diagnosis Date  . Hyperlipidemia   . Asthma   . DM (diabetes mellitus)   . Osteopenia   . Breast cancer   . Hypertension   . Depression   . PVD (peripheral vascular disease)   . Coronary atherosclerosis   . OSA (obstructive sleep apnea) 01/20/2014     PAST SURGICAL HISTORY: Past Surgical History  Procedure Laterality Date  . Appendectomy    . Cholecystectomy    . Cyst removal neck    . Breast lumpectomy      1992 on left, 2007 on right    FAMILY HISTORY: Family History  Problem Relation Age of Onset  . Heart disease Mother   . Heart disease Father     SOCIAL HISTORY:  History   Social History  . Marital Status: Married    Spouse Name: N/A  . Number of Children: N/A  . Years of Education: N/A   Occupational History  . retired    Social History Main Topics  . Smoking status: Former Smoker -- 1.00 packs/day for 40 years    Types: Cigarettes  Quit date: 05/10/2003  . Smokeless tobacco: Never Used  . Alcohol Use: No  . Drug Use: No  . Sexual Activity: Not on file   Other Topics Concern  . Not on file   Social History Narrative     PHYSICAL EXAM  Filed Vitals:   11/11/14 1355  BP: 138/66  Pulse: 72  Resp: 18  Height: 5\' 5"  (1.651 m)  Weight: 232 lb (105.235 kg)    Body mass index is 38.61 kg/(m^2).   General: The patient is well-developed and well-nourished and in no acute distress  Neck: The neck is supple.  Carotid bruits are noted.  The neck is nontender.  Cardiovascular: The heart has a regular rate and rhythm with a normal S1 and S2. There were no murmurs, gallops or rubs. Lungs are clear to auscultation.  Skin: Left foot has edema and mild redness.   .  Musculoskeletal:  Left foot is tender to deep palpation over mid metatarsals.   Toes are nontender.    Neurologic Exam  Mental status: The patient is alert and oriented x 3 at the time of the examination. The patient has apparent normal recent and remote memory, with an apparently normal attention span and concentration ability.   Speech is normal.  Cranial nerves: Extraocular movements are full.   Facial strength is normal.  Trapezius and sternocleidomastoid strength is normal. No dysarthria is noted.  The tongue is midline, and the  patient has symmetric elevation of the soft palate. No obvious hearing deficits are noted.  Motor:  Muscle bulk is normal.   Tone is normal. Strength is  5 / 5 in all 4 extremities.   Sensory: Sensory testing shows slightly reduced touch but symmetric temperature and vibration sensation in the top of the left foot.   Right sensory exam was normal.    Gait and station: Station is normal.   Gait is normal. Tandem gait is normal.   Reflexes: Deep tendon reflexes are symmetric and normal bilaterally (trace in legs).   Plantar responses are flexor.    DIAGNOSTIC DATA (LABS, IMAGING, TESTING) - I reviewed patient records, labs, notes, testing and imaging myself where available.      ASSESSMENT AND PLAN  Left foot pain  Dysesthesia  Bilateral carotid bruits   In summary, Addi Pak is a 61 -year-old woman who continues to have left foot after an injury 2 months ago. Although the foot is still tender, she feels that it has slowly improved. Since she continues to have swelling in the foot, it is difficult to know if there is a neuropathic component to her pain or not. Pressing on the top of the foot causes a deep pain which would be more consistent with continued local inflammation rather than reflex sympathetic dystrophy or a neuropathic process. I will have her take a Medrol dosepak.   If she is no better I will then have her try gabapentin.  We'll see if she's had a carotid Doppler recently with Dr. [name]. If not, because of the carotid bruits, this will need to be checked.    She will return to see me in 2 months or sooner if she has new or worsening symptoms.  Richard A. Felecia Shelling, MD, PhD 08/12/5033, 4:65 PM Certified in Neurology, Clinical Neurophysiology, Sleep Medicine, Pain Medicine and Neuroimaging  [facility] [address], [place], [place] [address] [phone]

## 2014-11-11 NOTE — Progress Notes (Signed)
Toradol 60mg  given IM RUOG, per v/o Dr. Arlean Hopping

## 2014-11-20 ENCOUNTER — Ambulatory Visit (INDEPENDENT_AMBULATORY_CARE_PROVIDER_SITE_OTHER): Payer: PPO

## 2014-11-20 DIAGNOSIS — R0989 Other specified symptoms and signs involving the circulatory and respiratory systems: Secondary | ICD-10-CM | POA: Diagnosis not present

## 2014-11-26 ENCOUNTER — Telehealth: Payer: Self-pay | Admitting: Neurology

## 2014-11-26 NOTE — Telephone Encounter (Signed)
Patient called requesting results from doppler study. Please call and advise. Patient can be reached at [phone].

## 2014-11-27 NOTE — Telephone Encounter (Signed)
I have spoken with [name] this afternoon and advised that I will call her as soon as I have results of carotid doppler./fim

## 2014-11-28 NOTE — Telephone Encounter (Signed)
I have spoken with pt. and per RAS, advised that carotid duplex showed some stenosis, will need to recheck in one year.  She verbalized understanding of same/fim

## 2014-11-28 NOTE — Telephone Encounter (Signed)
[place], and per RAS, will advise that carotid duplex showed some stenosis, will need to recheck in one year/fim

## 2014-11-28 NOTE — Telephone Encounter (Signed)
Patient returned call. Please call and advise.  °

## 2014-12-11 ENCOUNTER — Ambulatory Visit
Admission: RE | Admit: 2014-12-11 | Discharge: 2014-12-11 | Disposition: A | Discharge status: Home / Self Care | Payer: PPO | Source: Ambulatory Visit | Attending: Family Medicine | Admitting: Family Medicine

## 2014-12-17 ENCOUNTER — Telehealth: Payer: Self-pay | Admitting: Pulmonary Disease

## 2014-12-17 NOTE — Telephone Encounter (Signed)
Spoke with the pt  She states having increased SOB and wants to discuss changing inhalers  She has only been seeing VS for sleep  Needs to see MW for breathing issues during the day  OV with all meds scheduled for 12/19/14

## 2014-12-19 ENCOUNTER — Encounter: Payer: Self-pay | Admitting: Internal Medicine

## 2014-12-19 ENCOUNTER — Other Ambulatory Visit (INDEPENDENT_AMBULATORY_CARE_PROVIDER_SITE_OTHER): Payer: PPO

## 2014-12-19 ENCOUNTER — Ambulatory Visit (INDEPENDENT_AMBULATORY_CARE_PROVIDER_SITE_OTHER)
Admission: RE | Admit: 2014-12-19 | Discharge: 2014-12-19 | Disposition: A | Discharge status: Home / Self Care | Payer: PPO | Source: Ambulatory Visit | Attending: Internal Medicine | Admitting: Internal Medicine

## 2014-12-19 ENCOUNTER — Ambulatory Visit (INDEPENDENT_AMBULATORY_CARE_PROVIDER_SITE_OTHER): Payer: PPO | Admitting: Internal Medicine

## 2014-12-19 VITALS — BP 118/68 | HR 70 | Ht 65.0 in | Wt 230.0 lb

## 2014-12-19 DIAGNOSIS — G4734 Idiopathic sleep related nonobstructive alveolar hypoventilation: Secondary | ICD-10-CM | POA: Diagnosis not present

## 2014-12-19 DIAGNOSIS — I10 Essential (primary) hypertension: Secondary | ICD-10-CM

## 2014-12-19 DIAGNOSIS — J449 Chronic obstructive pulmonary disease, unspecified: Secondary | ICD-10-CM

## 2014-12-19 DIAGNOSIS — E669 Obesity, unspecified: Secondary | ICD-10-CM

## 2014-12-19 DIAGNOSIS — R0600 Dyspnea, unspecified: Secondary | ICD-10-CM | POA: Diagnosis not present

## 2014-12-19 LAB — BASIC METABOLIC PANEL
BUN: 17 mg/dL (ref 6–23)
CO2: 27 mEq/L (ref 19–32)
Calcium: 9.9 mg/dL (ref 8.4–10.5)
Chloride: 102 mEq/L (ref 96–112)
Creatinine, Ser: 0.75 mg/dL (ref 0.40–1.20)
GFR: 79.83 mL/min (ref >60.00)
Glucose, Bld: 137 mg/dL — ABNORMAL HIGH (ref 70–99)
Potassium: 3.9 mEq/L (ref 3.5–5.1)
Sodium: 138 mEq/L (ref 135–145)

## 2014-12-19 LAB — CBC WITH DIFFERENTIAL/PLATELET
Basophils Absolute: 0 10*3/uL (ref 0.0–0.1)
Basophils Relative: 0.4 % (ref 0.0–3.0)
Eosinophils Absolute: 0.5 10*3/uL (ref 0.0–0.7)
Eosinophils Relative: 5.5 % — ABNORMAL HIGH (ref 0.0–5.0)
HCT: 41.3 % (ref 36.0–46.0)
Hemoglobin: 13.7 g/dL (ref 12.0–15.0)
Lymphocytes Relative: 35.7 % (ref 12.0–46.0)
Lymphs Abs: 3.1 10*3/uL (ref 0.7–4.0)
MCHC: 33.2 g/dL (ref 30.0–36.0)
MCV: 86.7 fl (ref 78.0–100.0)
Monocytes Absolute: 0.6 10*3/uL (ref 0.1–1.0)
Monocytes Relative: 7.3 % (ref 3.0–12.0)
Neutro Abs: 4.4 10*3/uL (ref 1.4–7.7)
Neutrophils Relative %: 51.1 % (ref 43.0–77.0)
Platelets: 369 10*3/uL (ref 150.0–400.0)
RBC: 4.76 Mil/uL (ref 3.87–5.11)
RDW: 13.9 % (ref 11.5–15.5)
WBC: 8.7 10*3/uL (ref 4.0–10.5)

## 2014-12-19 LAB — TSH: TSH: 2.24 u[IU]/mL (ref 0.35–4.50)

## 2014-12-19 LAB — BRAIN NATRIURETIC PEPTIDE: Pro B Natriuretic peptide (BNP): 39 pg/mL (ref 0.0–100.0)

## 2014-12-19 NOTE — Progress Notes (Signed)
Subjective:   Patient ID: [name], female    DOB: 04/28/1941 MRN: [id number]    Brief patient profile:  50 yowf quit smoking 2005 at wt 200  With no significant copd by PFT's here in 2007 self referred for "copd" but pft's nl again 12/12/2012    History of Present Illness  10/24/2012 1st pulmonary ov since 2010 cc persistent x 5 years but somewhat variable doe x one aisle at [employer] off 02 on ACEi, spriva, and combivent which helps a little but can't turn a bad day into a good one. Bad days tend to be assoc with more cough and some hoarseness on ACEi which I  Recommended should be stopped if symptoms worsened in 2010 but they really haven't changed much one way or the other since then rec Consider stopping ramapril    12/12/12 f/u ov/ Wert re cough/ sob with nl pfts Chief Complaint  Patient presents with  . Followup with PFT    Pt states that overall her cough and SOB have improved since her last visit. No new co's today.    she is marginally better but still can't do an aisle at [employer] with dry cough and sob and not really much better p combivent rec Stop ramapril and spiriva Start avapro 150 mg one daily and if too strong take half (your new blood pressure pill) Only use your combivent    02/01/2013 f/u ov/Wert re: pseudoasthma Chief Complaint  Patient presents with  . Follow-up    Pt states that her cough and SOB continue to improve and her BP is doing well with avapro. She has noticed some swelling in her ankles since BP med change.     rarely need combivent at all, not really limited by sob and able to shop s difficulty  rec Add hctz (hydordiuril) 25 mg daily  Reduce avapro 150 mg one half daily  Please see patient coordinator before you leave today  to schedule ono on 02 > 4lpm      12/10/2013 f/u ov/Wert re: noct 02 :  02 sats on 02 as low mid 80's when wakes up  Chief Complaint  Patient presents with  . Follow-up    Pt last seen on 01/2013. Pt states she is  having DOE. Pt states her cough has resolved. Pt wearing 4L O2 at night. Pt has questions about CXR that was done on 11/10/2013 that was done at [facility] in [place]. Pt states she was told that she has pulmonary hypertension.   1st week in July acute ill with purulent sputum > rx in [place] and bck to baseline but was told may have [place]  rec Please see patient coordinator before you leave today  to schedule travel arrangements with [facility] to stay on 4lpm at bedtime during your trip   See Dr Daneen Schick re the diagnosis of pulmonary hypertension and I will send him a copy of my thoughts   Schedule pulmonary sleep evaluation with one of our pulmonary doctors > sood eval/ rx with cpap and she stopped noct 02    Last ov [place] 08/12/14  rec Ambulatory Oxygen saturation test> your oxygen dropped to 86% on RA w/ walking...  I rec that you start an inhaler> DULERA-100 2 sprays twice daily (& rinse out well after each treatment)  We will contact a home care company to deliver Oxygen to use 2L/min w/ exercise & at night...     12/19/2014 acute  ov/Wert re:  sob in pt with ? Asthma/ worse off dulera / does not use 02  Chief Complaint  Patient presents with  . Acute Visit    Pt c/o increased SOB for the past 2 wks- relates to the heat. She states she gets out of breath walking from her car to the front door. She uses combivent about 2 x daily if she has to go outside. Couls not tolerate the symbicort b/c it made her hoarse.    Some am belching/ no cough - onset was gradually worse p stopped dulera and could not tol symbicort but note hfa poor (see hfa)    No obvious daytime variabilty  or cp or chest tightness, subjective wheeze overt sinus or hb symptoms. No unusual exp hx or h/o childhood pna/ asthma or knowledge of premature birth.    Sleeping ok without nocturnal  or early am exacerbation  of respiratory  c/o's or need for noct saba. Also denies any obvious fluctuation of  symptoms with weather or environmental changes or other aggravating or alleviating factors except as outlined above   Current Medications, Allergies, Past Medical History, Past Surgical History, Family History, and Social History were reviewed in [employer] record.  ROS  The following are not active complaints unless bolded sore throat, dysphagia, dental problems, itching, sneezing,  nasal congestion or excess/ purulent secretions, ear ache,   fever, chills, sweats, unintended wt loss, pleuritic or exertional cp, hemoptysis,  orthopnea pnd or leg swelling bilaterally, presyncope, palpitations, heartburn, abdominal pain, anorexia, nausea, vomiting, diarrhea  or change in bowel or urinary habits, change in stools or urine, dysuria,hematuria,  rash, arthralgias, visual complaints, headache, numbness weakness or ataxia or problems with walking or coordination,  change in mood/affect or memory.         Past Medical History:  Hx of NEOPLASM, MALIGNANT, BREAST, HX OF (ICD-V10.3)  HYPERTENSION (ICD-401.9)  HYPERLIPIDEMIA (ICD-272.4)     Past Surgical History:  Right Lumpectomy 1992 Left breast lumpectomy 2007  Back surgery 1974  Cholecystectomy 1981   Family History:  allergies in mother  neg copd or other resp dz   Social History:  Married  Children  Retired  Former smoker. Quit in April 2005. Smoked for 40 years at 1 ppd.      Objective:   Physical Exam   amb wf nad  12/12/2012       229 > 231 02/01/2013 > 12/10/2013  227  > 12/19/2014 230  Wt Readings from Last 3 Encounters:  10/24/12 230 lb (104.327 kg)  07/07/11 224 lb (101.606 kg)  07/07/11 224 lb (101.606 kg)    HEENT: nl dentition, turbinates, and orophanx. Nl external ear canals without cough reflex   NECK :  without JVD/Nodes/TM/ nl carotid upstrokes bilaterally   LUNGS: no acc muscle use, clear to A and P bilaterally without cough on insp or exp maneuvers   CV:  RRR  no s3 or murmur or  increase in P2,   1+ sym  edema sym bilateral LE   ABD:  soft and nontender with nl excursion in the supine position. No bruits or organomegaly, bowel sounds nl  MS:  warm without deformities, calf tenderness, cyanosis or clubbing  SKIN: warm and dry without lesions    NEURO:  alert, approp, no deficits      CXR PA and Lateral:   12/19/2014 :     I personally reviewed images and agree with radiology impression as follows:     COPD  and left basilar scarring. No acute abnormality is noted.  Labs ordered/ reviewed:   Lab 12/19/14 1424  NA 138  K 3.9  CL 102  CO2 27  BUN 17  CREATININE 0.75  GLUCOSE 137*   Lab 12/19/14 1424  HGB 13.7  HCT 41.3  WBC 8.7  PLT 369.0   Lab Results  Component Value Date   TSH 2.24 12/19/2014     Lab Results  Component Value Date   PROBNP 39.0 12/19/2014    D dimer 1.09 noted   Assessment & Plan:

## 2014-12-19 NOTE — Patient Instructions (Addendum)
Please see patient coordinator before you leave today  to schedule a ono on CPAP and no 02   Restart dulera 100 Take 2 puffs first thing in am and then another 2 puffs about 12 hours later.   Only use your combivent as a rescue medication to be used if you can't catch your breath by resting or doing a relaxed purse lip breathing pattern.  - The less you use it, the better it will work when you need it. - Ok to use up to 1 puffs  every 4 hours if you must but call for immediate appointment if use goes up over your usual need - Don't leave home without it !!  (think of it like the spare tire for your car)   Please remember to go to the lab and x-ray department downstairs for your tests - we will call you with the results when they are available.     Follow up with Dr Halford Chessman next available - see Tammy NP or me if needed sooner

## 2014-12-19 NOTE — Assessment & Plan Note (Addendum)
The proper method of use, as well as anticipated side effects, of a metered-dose inhaler are discussed and demonstrated to the patient. Improved effectiveness after extensive coaching during this visit to a level of approximately 90% from a baseline of 50%   Convinced she'd been better on dulera 100 2bid than anything else so rec continue to see if corrects her problem

## 2014-12-20 ENCOUNTER — Encounter: Payer: Self-pay | Admitting: Internal Medicine

## 2014-12-20 DIAGNOSIS — E669 Obesity, unspecified: Secondary | ICD-10-CM | POA: Insufficient documentation

## 2014-12-20 LAB — D-DIMER, QUANTITATIVE: D-Dimer, Quant: 1.09 ug/mL-FEU — ABNORMAL HIGH (ref 0.00–0.48)

## 2014-12-20 NOTE — Assessment & Plan Note (Signed)
Adequate control on present rx, reviewed > no change in rx needed   

## 2014-12-20 NOTE — Assessment & Plan Note (Signed)
-   PFTs 08/02/05 FEV1 2.05 ( 97%) ratio 76%   - 10/24/2012   Walked RA x 3 laps @ 185 stopped due to sob and legs weak about the same time but no desats - PFT's 12/12/2012  wnl with dlco 68 corrects to 78% (no combivent day of pfts) - rec trial off acei 12/12/12  > marked improvement 02/01/13 - 12/19/2014  Walked RA x 2  laps @ 185 ft each stopped due to  Sob slow  pace, no desats  Symptoms are markedly disproportionate to objective findings and not clear this is a lung problem but pt does appear to have difficult airway management issues.   DDX of  difficult airways management all start with A and  include Adherence, Ace Inhibitors, Acid Reflux, Active Sinus Disease, Alpha 1 Antitripsin deficiency, Anxiety masquerading as Airways dz,  ABPA,  allergy(esp in young), Aspiration (esp in elderly), Adverse effects of meds,  Active smokers, A bunch of PE's (a small clot burden can't cause this syndrome unless there is already severe underlying pulm or vascular dz with poor reserve) plus two Bs  = Bronchiectasis and Beta blocker use..and one C= CHF  Adherence is always the initial "prime suspect" and is a multilayered concern that requires a "trust but verify" approach in every patient - starting with knowing how to use medications, especially inhalers, correctly, keeping up with refills and understanding the fundamental difference between maintenance and prns vs those medications only taken for a very short course and then stopped and not refilled.  The proper method of use, as well as anticipated side effects, of a metered-dose inhaler are discussed and demonstrated to the patient. Improved effectiveness after extensive coaching during this visit to a level of approximately  90% from a baseline of 50% so may do better if there is asthma present with more effective hfa technique consistently  ? A bunch of PE's > D dimer nl - while an upper level for age  Result of 1.09  may miss small peripheral pe, the clot burden  with sob is moderately high and the d dimer has a very high neg pred value in this setting    ? Allergy/ asthma > rechallenge with dulera/ blow out thru nose.   ? chf > excluded by bnp << 100

## 2014-12-20 NOTE — Assessment & Plan Note (Signed)
Body mass index is 38.27 kg/(m^2).  Lab Results  Component Value Date   TSH 2.24 12/19/2014     Contributing to gerd tendency/ doe/reviewed need  achieve and maintain neg calorie balance > defer f/u primary care including intermittently monitoring thyroid status

## 2014-12-20 NOTE — Assessment & Plan Note (Signed)
-   10/24/2012   Walked RA x 3 laps @ 185 stopped due to sob and legs weak about the same time but no desats - ONO RA 11/29/12  Pos desat < 89% x 2 h 21 min > rec 2lpm at hs  11/30/2012 and then repeat on 2lpm - repeat 02 sats on 02 02/05/13 desat <88% x 54.8 m >rec repeat on 4 lpm  Done 02/28/13 and no desat - ONO on CPAP no 02 12/19/2014 >>>   Need to confirm that cpap has corrected the noct 02 desats and if so d/c all 02

## 2014-12-22 ENCOUNTER — Telehealth: Payer: Self-pay | Admitting: Internal Medicine

## 2014-12-22 NOTE — Telephone Encounter (Signed)
Per d-dimer results: Result Note     Call patient : Study is unremarkable for age but does not rule out small blood clots completely so if breathing no better by 8/15 would do a CTa chest next   Per CXR results: Result Note     Call pt: Reviewed cxr and no acute change so no change in recommendations made at ov  --  Called spoke with pt. She reports her breathing is doing better since being on the dulera. She will call back if breathing worsens  FYI for MW

## 2014-12-23 ENCOUNTER — Other Ambulatory Visit: Payer: Self-pay | Admitting: Family Medicine

## 2014-12-23 DIAGNOSIS — R928 Other abnormal and inconclusive findings on diagnostic imaging of breast: Secondary | ICD-10-CM

## 2014-12-26 ENCOUNTER — Ambulatory Visit
Admission: RE | Admit: 2014-12-26 | Discharge: 2014-12-26 | Disposition: A | Discharge status: Home / Self Care | Payer: PPO | Source: Ambulatory Visit | Attending: Family Medicine | Admitting: Family Medicine

## 2014-12-26 DIAGNOSIS — R928 Other abnormal and inconclusive findings on diagnostic imaging of breast: Secondary | ICD-10-CM

## 2015-01-08 ENCOUNTER — Other Ambulatory Visit: Payer: Self-pay | Admitting: Pulmonary Disease

## 2015-01-13 ENCOUNTER — Telehealth: Payer: Self-pay | Admitting: Internal Medicine

## 2015-01-13 MED ORDER — ISOSORBIDE MONONITRATE ER 60 MG PO TB24: 60.0000 mg | ORAL_TABLET | Freq: Every day | ORAL | Status: DC

## 2015-01-13 NOTE — Telephone Encounter (Signed)
lmtcb X1 for pt  

## 2015-01-13 NOTE — Telephone Encounter (Signed)
Per MW at last OV:   Patient Instructions     Please see patient coordinator before you leave today to schedule a ono on CPAP and no 02   Restart dulera 100 Take 2 puffs first thing in am and then another 2 puffs about 12 hours later.   Only use your combivent as a rescue medication to be used if you can't catch your breath by resting or doing a relaxed purse lip breathing pattern.  - The less you use it, the better it will work when you need it. - Ok to use up to 1 puffs every 4 hours if you must but call for immediate appointment if use goes up over your usual need - Don't leave home without it !! (think of it like the spare tire for your car)   Please remember to go to the lab and x-ray department downstairs for your tests - we will call you with the results when they are available.    Follow up with Dr Halford Chessman next available - see Tammy NP or me if needed sooner      1) ONO was done on 12-29-14 through [facility]. Pt would like to results of this. 2) Pt states she would like to stop Dulera as its causing hoarseness-rinses mouth well after use/also brushes teeth. 3) pt would like to get 90 day refill io Imdur with 3 refills.    MW please advise. Thanks.

## 2015-01-13 NOTE — Telephone Encounter (Signed)
Called and spoke with pt Informed of MW rec of dulera Pt voiced understanding  Informed pt that we have not received ONO results yet from [facility] Left message with Lenna Sciara to call back to find results  Waiting on return call

## 2015-01-13 NOTE — Telephone Encounter (Signed)
Ok to stop dulera and if breathing worse start back on one bid  I don't have her ono - call advanced to see why  Be sure has f/u with Lynelle Smoke / Halford Chessman

## 2015-01-13 NOTE — Telephone Encounter (Signed)
Pt returned call [phone]

## 2015-01-14 ENCOUNTER — Ambulatory Visit (INDEPENDENT_AMBULATORY_CARE_PROVIDER_SITE_OTHER): Payer: PPO | Admitting: Neurology

## 2015-01-14 ENCOUNTER — Encounter: Payer: Self-pay | Admitting: Neurology

## 2015-01-14 VITALS — BP 130/58 | HR 68 | Resp 20 | Ht 65.0 in | Wt 229.0 lb

## 2015-01-14 DIAGNOSIS — R0989 Other specified symptoms and signs involving the circulatory and respiratory systems: Secondary | ICD-10-CM

## 2015-01-14 DIAGNOSIS — M79672 Pain in left foot: Secondary | ICD-10-CM

## 2015-01-14 NOTE — Telephone Encounter (Signed)
[name]

## 2015-01-14 NOTE — Telephone Encounter (Signed)
lmtcb for Melissa.  

## 2015-01-14 NOTE — Telephone Encounter (Signed)
Called and spoke with Melissa from [facility]---she stated that they delivered the device on 8/22 and picked up on 8/23.  They have no report at this time for the ONO.  She stated that the person that does the ONO reports is out today and will be back tomorrow.  Melissa will check with her tomorrow and will call back to let us know about the report.  Will hold message in triage for call back on 01/15/15

## 2015-01-14 NOTE — Progress Notes (Signed)
[facility]  PATIENT: [name] DOB: 1940-10-18  REFERRING DOCTOR OR PCP:  Hulan Fess SOURCE: patient and records from PCP's office  _________________________________   HISTORICAL  CHIEF COMPLAINT:  Chief Complaint  Patient presents with  . Numbness    Sts. numbness left foot is much better since completing Medrol dose pk.  Carotid duplex was done 11-20-14/fim  . Carotid Artery Stenosis    HISTORY OF PRESENT ILLNESS:  [name] is a 74 year old woman who injured her ankle while stepping off of a train on 09/17/2014.  Pain is much better since the steroid pack prescribed after the last visit.   Additionally, the swelling in the foot is almost completely resolved.    She no longer has tenderness to pressure.   She had carotid bruit at the last visit.   Ultrasound showed high velocity c/w stenosis but there was no plaque burden.  This would be more c/w torturous flow rather than stenosis from plaque.   She also has peripheral vascular disease and coronary artery disease. She reports having vascular studies in the legs earlier this year.  REVIEW OF SYSTEMS: Constitutional: No fevers, chills, sweats, or change in appetite Eyes: No visual changes, double vision, eye pain Ear, nose and throat: No hearing loss, ear pain, nasal congestion, sore throat Cardiovascular: No chest pain, palpitations.   Has CAD Respiratory: No shortness of breath at rest or with exertion.   No wheezes.  Has OSA GastrointestinaI: No nausea, vomiting, diarrhea, abdominal pain, fecal incontinence Genitourinary: No dysuria, urinary retention or frequency.  No nocturia. Musculoskeletal: No neck pain, back pain Integumentary: No rash, pruritus, skin lesions Neurological: as above Psychiatric: No depression at this time.  No anxiety Endocrine: No palpitations, diaphoresis, change in appetite.  Has Insulin dependent Type 2 DM Hematologic/Lymphatic: No anemia, purpura,  petechiae.  ALLERGIES: Allergies  Allergen Reactions  . Amoxicillin Other (See Comments)    REACTION: gi upset  . Codeine   . Diclofenac Sodium Hives  . Dye Fdc Red  [Red Dye]   . Erythromycin Diarrhea    REACTION: gi upset  . Glipizide Other (See Comments)    REACTION: gi upset  . Metformin Diarrhea    REACTION: gi upset  . Metformin And Related   . Methocarbamol Other (See Comments)    REACTION: tachycardia  . Niacin Hives    REACTION: rash and sob  . Penicillins   . Ramipril   . Sulfa Antibiotics   . Tetanus Immune Globulin   . Voltaren  [Diclofenac]   . Tetanus Toxoid Itching, Swelling and Rash    REACTION: swelling    HOME MEDICATIONS:  Current outpatient prescriptions:  .  aspirin 81 MG tablet, Take 81 mg by mouth daily., Disp: , Rfl:  .  azelastine (ASTELIN) 137 MCG/SPRAY nasal spray, , Disp: , Rfl:  .  cholecalciferol (VITAMIN D) 1000 UNITS tablet, Take 2,000 Units by mouth daily., Disp: , Rfl:  .  hydrochlorothiazide (HYDRODIURIL) 25 MG tablet, Take 25 mg by mouth daily as needed (swelling). As needed, Disp: , Rfl:  .  insulin glargine (LANTUS) 100 UNIT/ML injection, 25 units Q PM, 35 units Q AM, Disp: , Rfl:  .  insulin lispro (HUMALOG) 100 UNIT/ML injection, Inject 25-30 Units into the skin 3 (three) times daily before meals. Dose adjusted by carb count and blood sugar levels, Disp: , Rfl:  .  Ipratropium-Albuterol (COMBIVENT RESPIMAT) 20-100 MCG/ACT AERS respimat, Inhale 1 puff into the lungs every 6 (six) hours.,  Disp: 1 Inhaler, Rfl: 2 .  irbesartan (AVAPRO) 75 MG tablet, TAKE ONE TABLET BY MOUTH ONCE DAILY, Disp: 30 tablet, Rfl: 3 .  Naproxen Sodium (ALEVE PO), Take 1-2 tablets by mouth every 12 (twelve) hours as needed (for leg pain)., Disp: , Rfl:  .  rosuvastatin (CRESTOR) 40 MG tablet, Take 40 mg by mouth daily., Disp: , Rfl:  .  sertraline (ZOLOFT) 100 MG tablet, Take 100 mg by mouth daily., Disp: , Rfl:  .  isosorbide mononitrate (IMDUR) 60 MG 24 hr  tablet, Take 1 tablet (60 mg total) by mouth daily. (Patient not taking: Reported on 01/14/2015), Disp: 90 tablet, Rfl: 3 .  mometasone-formoterol (DULERA) 100-5 MCG/ACT AERO, Inhale 2 puffs into the lungs 2 (two) times daily., Disp: 1 Inhaler, Rfl: 0  PAST MEDICAL HISTORY: Past Medical History  Diagnosis Date  . Hyperlipidemia   . Asthma   . DM (diabetes mellitus)   . Osteopenia   . Breast cancer   . Hypertension   . Depression   . PVD (peripheral vascular disease)   . Coronary atherosclerosis   . OSA (obstructive sleep apnea) 01/20/2014    PAST SURGICAL HISTORY: Past Surgical History  Procedure Laterality Date  . Appendectomy    . Cholecystectomy    . Cyst removal neck    . Breast lumpectomy      1992 on left, 2007 on right    FAMILY HISTORY: Family History  Problem Relation Age of Onset  . Heart disease Mother   . Heart disease Father     SOCIAL HISTORY:  Social History   Social History  . Marital Status: Married    Spouse Name: N/A  . Number of Children: N/A  . Years of Education: N/A   Occupational History  . retired    Social History Main Topics  . Smoking status: Former Smoker -- 1.00 packs/day for 40 years    Types: Cigarettes    Quit date: 05/10/2003  . Smokeless tobacco: Never Used  . Alcohol Use: No  . Drug Use: No  . Sexual Activity: Not on file   Other Topics Concern  . Not on file   Social History Narrative     PHYSICAL EXAM  Filed Vitals:   01/14/15 1300  BP: 130/58  Pulse: 68  Resp: 20  Height: 5\' 5"  (1.651 m)  Weight: 229 lb (103.874 kg)    Body mass index is 38.11 kg/(m^2).   General: The patient is well-developed and well-nourished and in no acute distress  Musculoskeletal:  Left foot is no longer tender over mid metatarsals. No redness.   Very mild edema  Toes are nontender.    Neurologic Exam  Mental status: The patient is alert and oriented x 3 at the time of the examination. The patient has apparent normal recent  and remote memory, with an apparently normal attention span and concentration ability.   Speech is normal.  Cranial nerves: Extraocular movements are full.   Facial strength is normal.     Motor:  Muscle bulk is normal.   Tone is normal in legs. Strength is  5 / 5 in legs.   Sensory: Sensory testing shows symmetric touch in feet    Gait and station: Station is normal.   Gait is normal. Tandem gait is mildly wide   Reflexes: Deep tendon reflexes are symmetric and normal bilaterally (trace in legs).       DIAGNOSTIC DATA (LABS, IMAGING, TESTING) - I reviewed patient records, labs, notes,  testing and imaging myself where available.      ASSESSMENT AND PLAN  Left foot pain  Carotid bruit, unspecified laterality   1.   Her foot pain is doing well and no further interactional medication is needed at this time. 2.   The carotid bruit appears to be due more to vascular tortuosity than to plaque. Consider repeat study in a year or so.     She will return to see me as needed.  Richard A. Felecia Shelling, MD, PhD 07/15/6884, 4:84 PM Certified in Neurology, Clinical Neurophysiology, Sleep Medicine, Pain Medicine and Neuroimaging  [facility] [address] [place], [place] [address] [phone]

## 2015-01-15 MED ORDER — IRBESARTAN 75 MG PO TABS: 75.0000 mg | ORAL_TABLET | Freq: Every day | ORAL | Status: DC

## 2015-01-15 NOTE — Telephone Encounter (Signed)
ONO report being faxed. Will forward to leslie to look out for results.

## 2015-01-15 NOTE — Telephone Encounter (Signed)
Results received and placed in MW's lookat  Please advise results, thanks!

## 2015-01-15 NOTE — Telephone Encounter (Signed)
Ok o refill meds/ her ono on cpap and no 02 is fine so no 02 at hs needed ; ok to d/c it

## 2015-01-15 NOTE — Telephone Encounter (Signed)
[name] returned call  [phone] She faxed ono report.

## 2015-01-15 NOTE — Telephone Encounter (Signed)
Called Melissa and she is going to check on this and call back

## 2015-02-02 ENCOUNTER — Encounter: Payer: Self-pay | Admitting: Internal Medicine

## 2015-02-05 ENCOUNTER — Ambulatory Visit (INDEPENDENT_AMBULATORY_CARE_PROVIDER_SITE_OTHER): Payer: PPO | Admitting: Pulmonary Disease

## 2015-02-05 ENCOUNTER — Encounter: Payer: Self-pay | Admitting: Pulmonary Disease

## 2015-02-05 VITALS — BP 118/78 | HR 61 | Temp 98.6°F | Ht 65.5 in | Wt 226.2 lb

## 2015-02-05 DIAGNOSIS — G4733 Obstructive sleep apnea (adult) (pediatric): Secondary | ICD-10-CM

## 2015-02-05 NOTE — Progress Notes (Signed)
Chief Complaint  Patient presents with  . Follow-up    pt states shes doing well. pt states her breathing is a little off due to her diabettic medication being switiched. pt c/o occasional SOB, and post nasal drip. pt using CPAP  every night for about 8 - 9 hours. pressure goot. pt states the mask keeps moving. DME: [facility].    History of Present Illness: [name] is a 74 y.o. female former smoker with severe OSA, Asthma.  She can't sleep w/o BiPAP.  Her sleep is much deeper, and more refreshing.  She is more alert during the day.  Her only problem is with her BiPAP mask fit.  She has full face mask.  She has noticed more trouble with high/low blood sugars after her DM meds were changed.  She was in [place] and had to change her DM meds.  She notices feeling short of breath when her blood sugars are out of whack >> breathing better once her blood sugars come back around.  TESTS: PFT 12/12/12 >> FEV1 2.22 (101%), FEV1% 84, TLC 3.83 (74%), DLCO 68%, no BD Echo 01/21/14 >> EF 65 to 41%, grade 1 diastolic dysfx, diastolic dysfx PSG 6/38/45 >> AHI 142.3, SaO2 low 82% BiPAP titration 03/17/14 >> BiPAP 20/14 cm H2O >> AHI 2.9, +R. Centrals with CPAP. BIPAP 01/07/15 to 02/05/15 >> used on 30 of 30 nights with average 7 hrs and 45 min.  Average AHI is 1.3 with BiPAP 16/12 cm H2O.   Past medical hx >> CAD, PVD, HLD, HTN, DM, Breast cancer, Depression  Past surgical hx, Medications, Allergies, Family hx, Social hx all reviewed.   Physical Exam: BP 118/78 mmHg  Pulse 61  Temp(Src) 98.6 F (37 C) (Oral)  Ht 5' 5.5" (1.664 m)  Wt 226 lb 3.2 oz (102.604 kg)  BMI 37.06 kg/m2  SpO2 90%  General - No distress ENT - No sinus tenderness, no oral exudate, no LAN, MP 3, scalloped tongue Cardiac - s1s2 regular, no murmur Chest - No wheeze/rales/dullness Back - No focal tenderness Abd - Soft, non-tender Ext - No edema Neuro - Normal strength Skin - No rashes Psych - normal mood, and  behavior   Assessment/Plan:  Obstructive sleep apnea. She is compliant with BiPAP and reports benefit from therapy. Plan: - continue BiPAP 16/12 cm H2O - she will check with her DME about mask fit  Obesity. Plan: - discussed importance of weight loss  Asthma. Plan: - continue dulera, and prn albuterol    Chesley Mires, MD [place] [facility] Pager:  [phone]

## 2015-02-05 NOTE — Patient Instructions (Signed)
Can check following company websites for CPAP mask options: [employer]med, [employer]ics, [employer], [employer]  Follow up in 1 year 

## 2015-06-02 ENCOUNTER — Other Ambulatory Visit: Payer: Self-pay | Admitting: Family Medicine

## 2015-06-02 DIAGNOSIS — I10 Essential (primary) hypertension: Secondary | ICD-10-CM | POA: Diagnosis not present

## 2015-06-02 DIAGNOSIS — E1165 Type 2 diabetes mellitus with hyperglycemia: Secondary | ICD-10-CM | POA: Diagnosis not present

## 2015-06-02 DIAGNOSIS — N63 Unspecified lump in unspecified breast: Secondary | ICD-10-CM

## 2015-06-02 DIAGNOSIS — J069 Acute upper respiratory infection, unspecified: Secondary | ICD-10-CM | POA: Diagnosis not present

## 2015-06-02 DIAGNOSIS — G4733 Obstructive sleep apnea (adult) (pediatric): Secondary | ICD-10-CM | POA: Diagnosis not present

## 2015-06-03 ENCOUNTER — Telehealth: Payer: Self-pay | Admitting: Pulmonary Disease

## 2015-06-03 DIAGNOSIS — G4733 Obstructive sleep apnea (adult) (pediatric): Secondary | ICD-10-CM

## 2015-06-03 NOTE — Telephone Encounter (Signed)
Attempted to contact pt. No answer. Will try back. 

## 2015-06-04 NOTE — Telephone Encounter (Signed)
Can send order to change BiPAP to 14/10 cm H2O.  It is okay to have condensation in tank.  If starts getting condensation in her tube or mask, then she should adjust the temperature on her BiPAP humidifier and insulate the tube with a towel.

## 2015-06-04 NOTE — Telephone Encounter (Signed)
Patient calling to have BiPAP settings adjusted. The pressure setting is too high.  Patient says that she is getting condensation in the water tank.  She wants to know how to adjust that so it doesn't happen, is it okay for it to have condensation in the tank? (ResMed AirCurve 10)

## 2015-06-05 NOTE — Telephone Encounter (Signed)
Patient notified of Dr. Sood's recommendations. Nothing further needed.  

## 2015-06-05 NOTE — Telephone Encounter (Signed)
LM for pt x 2 

## 2015-06-05 NOTE — Telephone Encounter (Signed)
[phone] calling back

## 2015-06-05 NOTE — Telephone Encounter (Signed)
Order entered to change pressure setting. Attempted to contact patient, left message for her to call back.

## 2015-06-09 DIAGNOSIS — G4733 Obstructive sleep apnea (adult) (pediatric): Secondary | ICD-10-CM | POA: Diagnosis not present

## 2015-06-17 ENCOUNTER — Other Ambulatory Visit: Payer: Self-pay | Admitting: Family Medicine

## 2015-06-17 ENCOUNTER — Ambulatory Visit
Admission: RE | Admit: 2015-06-17 | Discharge: 2015-06-17 | Disposition: A | Discharge status: Home / Self Care | Payer: PPO | Source: Ambulatory Visit | Attending: Family Medicine | Admitting: Family Medicine

## 2015-06-17 DIAGNOSIS — N63 Unspecified lump in unspecified breast: Secondary | ICD-10-CM

## 2015-06-17 DIAGNOSIS — N6489 Other specified disorders of breast: Secondary | ICD-10-CM | POA: Diagnosis not present

## 2015-06-17 DIAGNOSIS — R922 Inconclusive mammogram: Secondary | ICD-10-CM | POA: Diagnosis not present

## 2015-07-16 ENCOUNTER — Telehealth: Payer: Self-pay | Admitting: Internal Medicine

## 2015-07-16 ENCOUNTER — Encounter: Payer: Self-pay | Admitting: Interventional Cardiology

## 2015-07-16 NOTE — Telephone Encounter (Signed)
Called and spoke to pt. Pt states the combivent is now too expensive and would like an alternative rescue inhaler. Pt last seen in 12/2014.  Dr. Melvyn Novas please advise. Thanks.    Patient Instructions       Please see patient coordinator before you leave today  to schedule a ono on CPAP and no 02   Restart dulera 100 Take 2 puffs first thing in am and then another 2 puffs about 12 hours later.   Only use your combivent as a rescue medication to be used if you can't catch your breath by resting or doing a relaxed purse lip breathing pattern.   - The less you use it, the better it will work when you need it. - Ok to use up to 1 puffs  every 4 hours if you must but call for immediate appointment if use goes up over your usual need - Don't leave home without it !!  (think of it like the spare tire for your car)   Please remember to go to the lab and x-ray department downstairs for your tests - we will call you with the results when they are available.     Follow up with Dr Halford Chessman next available - see Tammy NP or me if needed sooner

## 2015-07-17 MED ORDER — ALBUTEROL SULFATE HFA 108 (90 BASE) MCG/ACT IN AERS: 2.0000 | INHALATION_SPRAY | Freq: Four times a day (QID) | RESPIRATORY_TRACT | Status: DC | PRN

## 2015-07-17 NOTE — Telephone Encounter (Signed)
I am not following her - see Dr Juanetta Gosling last note, supposed to be on dulera and prn albuterol

## 2015-07-17 NOTE — Telephone Encounter (Signed)
Called spoke with pt. Aware of recs. RX sent in. Nothing further needed 

## 2015-07-17 NOTE — Telephone Encounter (Signed)
Patient returned call, asked to be called back at [phone].

## 2015-07-17 NOTE — Telephone Encounter (Signed)
Send order for ventolin HFA two puffs every qid prn, #1 inhaler with 5 refills.

## 2015-07-17 NOTE — Telephone Encounter (Signed)
Attempted to contact patient, no answer, could not leave message. Will call back

## 2015-07-17 NOTE — Telephone Encounter (Signed)
Patient states that she just needs a rescue inhaler to keep with her for emergencies.  Pharmacy: [facility]  Dr. Halford Chessman, please advise if ok to add rescue inhaler to patient's meds?

## 2015-09-24 ENCOUNTER — Telehealth: Payer: Self-pay | Admitting: Pulmonary Disease

## 2015-09-24 NOTE — Telephone Encounter (Signed)
Spoke with pt and advised that it is ok to take Claritin for allergy symptoms and also to continue Astelin.  Pt verbalized understanding.

## 2015-10-08 DIAGNOSIS — G4733 Obstructive sleep apnea (adult) (pediatric): Secondary | ICD-10-CM | POA: Diagnosis not present

## 2015-10-09 ENCOUNTER — Ambulatory Visit (INDEPENDENT_AMBULATORY_CARE_PROVIDER_SITE_OTHER): Payer: PPO | Admitting: Interventional Cardiology

## 2015-10-09 ENCOUNTER — Encounter: Payer: Self-pay | Admitting: Interventional Cardiology

## 2015-10-09 VITALS — BP 128/60 | HR 70 | Ht 65.5 in | Wt 225.2 lb

## 2015-10-09 DIAGNOSIS — I8311 Varicose veins of right lower extremity with inflammation: Secondary | ICD-10-CM

## 2015-10-09 DIAGNOSIS — I10 Essential (primary) hypertension: Secondary | ICD-10-CM

## 2015-10-09 DIAGNOSIS — G4733 Obstructive sleep apnea (adult) (pediatric): Secondary | ICD-10-CM | POA: Diagnosis not present

## 2015-10-09 DIAGNOSIS — E7800 Pure hypercholesterolemia, unspecified: Secondary | ICD-10-CM | POA: Diagnosis not present

## 2015-10-09 DIAGNOSIS — I2510 Atherosclerotic heart disease of native coronary artery without angina pectoris: Secondary | ICD-10-CM

## 2015-10-09 DIAGNOSIS — E1165 Type 2 diabetes mellitus with hyperglycemia: Secondary | ICD-10-CM | POA: Diagnosis not present

## 2015-10-09 MED ORDER — ISOSORBIDE MONONITRATE ER 60 MG PO TB24: 60.0000 mg | ORAL_TABLET | Freq: Every day | ORAL | Status: DC

## 2015-10-09 NOTE — Progress Notes (Signed)
Cardiology Office Note    Date:  10/09/2015   ID:  Jamel, Bumstead [birth year], MRN [id number]  PCP:  Gennette Pac, MD  Cardiologist: [name], MD   Chief Complaint  Patient presents with  . Coronary Artery Disease    History of Present Illness:  [name] is a 75 y.o. female who presents for follow-up of coronary artery disease (high-grade distal RCA collateralized left), hypertension, obstructive sleep apnea, venous insufficiency with stasis dermatitis, right lower extremity  [name] has chronic stable angina. She has known chronic total occlusion/high-grade obstruction in the distal RCA collateralized from the left. We have treated this with medical therapy over the years and she has done well. She's had no particular cardiac complaints. She did have difficulty after having a molar pulled, developing dry socket.  Dr. Halford Chessman diagnosed and is treating sleep apnea. This is made a dramatic improvement in her quality of life.   Past Medical History  Diagnosis Date  . Hyperlipidemia   . Asthma   . DM (diabetes mellitus) ([place])   . Osteopenia   . Breast cancer ([place])   . Hypertension   . Depression   . PVD (peripheral vascular disease) ([place])   . Coronary atherosclerosis   . OSA (obstructive sleep apnea) 01/20/2014    Past Surgical History  Procedure Laterality Date  . Appendectomy    . Cholecystectomy    . Cyst removal neck    . Breast lumpectomy      1992 on left, 2007 on right    Current Medications: Outpatient Prescriptions Prior to Visit  Medication Sig Dispense Refill  . albuterol (PROVENTIL HFA;VENTOLIN HFA) 108 (90 Base) MCG/ACT inhaler Inhale 2 puffs into the lungs 4 (four) times daily as needed for wheezing or shortness of breath. 1 Inhaler 5  . aspirin 81 MG tablet Take 81 mg by mouth daily.    Marland Kitchen azelastine (ASTELIN) 137 MCG/SPRAY nasal spray     . cholecalciferol (VITAMIN D) 1000 UNITS tablet Take 2,000 Units by mouth daily.    .  isosorbide mononitrate (IMDUR) 60 MG 24 hr tablet Take 1 tablet (60 mg total) by mouth daily. 90 tablet 3  . Naproxen Sodium (ALEVE PO) Take 1-2 tablets by mouth every 12 (twelve) hours as needed (for leg pain).    . rosuvastatin (CRESTOR) 40 MG tablet Take 40 mg by mouth daily.    . sertraline (ZOLOFT) 100 MG tablet Take 100 mg by mouth daily.    . hydrochlorothiazide (HYDRODIURIL) 25 MG tablet Take 25 mg by mouth daily as needed (swelling). Reported on 10/09/2015    . insulin glargine (LANTUS) 100 UNIT/ML injection 25 units Q PM, 35 units Q AM    . insulin lispro (HUMALOG) 100 UNIT/ML injection Inject 25-30 Units into the skin 3 (three) times daily before meals. Dose adjusted by carb count and blood sugar levels    . Ipratropium-Albuterol (COMBIVENT RESPIMAT) 20-100 MCG/ACT AERS respimat Inhale 1 puff into the lungs every 6 (six) hours. (Patient not taking: Reported on 10/09/2015) 1 Inhaler 2  . mometasone-formoterol (DULERA) 100-5 MCG/ACT AERO Inhale 2 puffs into the lungs 2 (two) times daily. 1 Inhaler 0   No facility-administered medications prior to visit.     Allergies:   Saccharin; Amoxicillin; Codeine; Diclofenac sodium; Dye fdc red ; Erythromycin; Glipizide; Metformin; Metformin and related; Methocarbamol; Niacin; Penicillins; Ramipril; Sulfa antibiotics; Tetanus immune globulin; Voltaren ; and Tetanus toxoid   Social History   Social History  .  Marital Status: Married    Spouse Name: N/A  . Number of Children: N/A  . Years of Education: N/A   Occupational History  . retired    Social History Main Topics  . Smoking status: Former Smoker -- 1.00 packs/day for 40 years    Types: Cigarettes    Quit date: 05/10/2003  . Smokeless tobacco: Never Used  . Alcohol Use: No  . Drug Use: No  . Sexual Activity: Not Asked   Other Topics Concern  . None   Social History Narrative     Family History:  The patient's family history includes Heart disease in her father and mother.    ROS:   Please see the history of present illness.    Dry socket pain has resolved. She now wears C Pap with marked improvement in quality of life. Bilateral leg pain related to the back problems. Chronic back pain.  All other systems reviewed and are negative.   PHYSICAL EXAM:   VS:  BP 128/60 mmHg  Pulse 70  Ht 5' 5.5" (1.664 m)  Wt 225 lb 3.2 oz (102.15 kg)  BMI 36.89 kg/m2   GEN: Well nourished, well developed, in no acute distressP rate morbidly obese HEENT: normal Neck: no JVD, carotid bruits, or masses Cardiac: RRR; no murmurs, rubs, or gallops,no edema . Stasis dermatitis has improved. Respiratory:  clear to auscultation bilaterally, normal work of breathing GI: soft, nontender, nondistended, + BS MS: no deformity or atrophy Skin: warm and dry, no rash Neuro:  Alert and Oriented x 3, Strength and sensation are intact Psych: euthymic mood, full affect  Wt Readings from Last 3 Encounters:  10/09/15 225 lb 3.2 oz (102.15 kg)  02/05/15 226 lb 3.2 oz (102.604 kg)  01/14/15 229 lb (103.874 kg)      Studies/Labs Reviewed:   EKG:  EKG  Sinus rhythm, borderline first degree AV block, nonspecific T wave flattening.  Recent Labs: 12/19/2014: BUN 17; Creatinine, Ser 0.75; Hemoglobin 13.7; Platelets 369.0; Potassium 3.9; Pro B Natriuretic peptide (BNP) 39.0; Sodium 138; TSH 2.24   Lipid Panel No results found for: CHOL, TRIG, HDL, CHOLHDL, VLDL, LDLCALC, LDLDIRECT  Additional studies/ records that were reviewed today include:  No new data identified. Echocardiogram from September 2015: Study Conclusions  - Left ventricle: The cavity size was normal. Systolic function was vigorous. The estimated ejection fraction was in the range of 65% to 70%. Wall motion was normal; there were no regional wall motion abnormalities. Doppler parameters are consistent with abnormal left ventricular relaxation (grade 1 diastolic dysfunction). Doppler parameters are consistent  with high ventricular filling pressure. - Aortic valve: Transvalvular velocity was minimally increased. There was no stenosis. There was no regurgitation. - Mitral valve: There is significant mitral annular calcifications, predominantly posterior. Calcified annulus. Mildly thickened leaflets . Transvalvular velocity was within the normal range. There was no evidence for stenosis. There was no regurgitation. - Left atrium: The atrium was normal in size. - Right ventricle: Systolic function was normal. - Right atrium: The atrium was normal in size. - Pulmonic valve: There was no regurgitation. - Pulmonary arteries: Systolic pressure was within the normal range. - Pericardium, extracardiac: There was no pericardial effusion.  Impressions:  - Normal biventricular size and systolic function. Mildly elevated transaortic velocities. Mitral annular calcifications.  ASSESSMENT:    1. CAD in native artery   2. Essential hypertension   3. OSA (obstructive sleep apnea)   4. Stasis dermatitis, acute, right      PLAN:  In order of problems listed above:  1. Stable coronary disease responsive to nitroglycerin without change in pattern. 2. Stable. We advocated a low-salt diet and aerobic activity to maintain control without having to increase medication regimen. 3. Encouraged continued use of C-Pap 4. Stasis dermatitis has improved.    Medication Adjustments/Labs and Tests Ordered: Current medicines are reviewed at length with the patient today.  Concerns regarding medicines are outlined above.  Medication changes, Labs and Tests ordered today are listed in the Patient Instructions below. There are no Patient Instructions on file for this visit.   Signed, [name], MD  10/09/2015 4:36 PM    [facility] [address], [place], [place]  [address] Phone: [phone]; Fax: [phone]

## 2015-10-09 NOTE — Patient Instructions (Signed)
Medication Instructions:  Your physician recommends that you continue on your current medications as directed. Please refer to the Current Medication list given to you today.   Labwork: none  Testing/Procedures: none  Follow-Up: Your physician wants you to follow-up in: 12 months.  You will receive a reminder letter in the mail two months in advance. If you don't receive a letter, please call our office to schedule the follow-up appointment.  Your physician discussed the importance of regular exercise and recommended that you start or continue a regular exercise program for good health.  Any Other Special Instructions Will Be Listed Below (If Applicable).     If you need a refill on your cardiac medications before your next appointment, please call your pharmacy.

## 2015-10-13 DIAGNOSIS — I10 Essential (primary) hypertension: Secondary | ICD-10-CM | POA: Diagnosis not present

## 2015-10-13 DIAGNOSIS — E7800 Pure hypercholesterolemia, unspecified: Secondary | ICD-10-CM | POA: Diagnosis not present

## 2015-10-13 DIAGNOSIS — E1165 Type 2 diabetes mellitus with hyperglycemia: Secondary | ICD-10-CM | POA: Diagnosis not present

## 2015-10-15 DIAGNOSIS — Z0000 Encounter for general adult medical examination without abnormal findings: Secondary | ICD-10-CM | POA: Diagnosis not present

## 2015-10-15 DIAGNOSIS — E782 Mixed hyperlipidemia: Secondary | ICD-10-CM | POA: Diagnosis not present

## 2015-10-15 DIAGNOSIS — J449 Chronic obstructive pulmonary disease, unspecified: Secondary | ICD-10-CM | POA: Diagnosis not present

## 2015-10-15 DIAGNOSIS — F338 Other recurrent depressive disorders: Secondary | ICD-10-CM | POA: Diagnosis not present

## 2015-10-15 DIAGNOSIS — I25119 Atherosclerotic heart disease of native coronary artery with unspecified angina pectoris: Secondary | ICD-10-CM | POA: Diagnosis not present

## 2015-10-15 DIAGNOSIS — Z853 Personal history of malignant neoplasm of breast: Secondary | ICD-10-CM | POA: Diagnosis not present

## 2015-10-15 DIAGNOSIS — I10 Essential (primary) hypertension: Secondary | ICD-10-CM | POA: Diagnosis not present

## 2015-10-15 DIAGNOSIS — G4730 Sleep apnea, unspecified: Secondary | ICD-10-CM | POA: Diagnosis not present

## 2015-10-15 DIAGNOSIS — M8580 Other specified disorders of bone density and structure, unspecified site: Secondary | ICD-10-CM | POA: Diagnosis not present

## 2015-10-15 DIAGNOSIS — Z86010 Personal history of colonic polyps: Secondary | ICD-10-CM | POA: Diagnosis not present

## 2015-10-15 DIAGNOSIS — R8290 Unspecified abnormal findings in urine: Secondary | ICD-10-CM | POA: Diagnosis not present

## 2015-10-15 DIAGNOSIS — E1151 Type 2 diabetes mellitus with diabetic peripheral angiopathy without gangrene: Secondary | ICD-10-CM | POA: Diagnosis not present

## 2015-10-22 DIAGNOSIS — E1165 Type 2 diabetes mellitus with hyperglycemia: Secondary | ICD-10-CM | POA: Diagnosis not present

## 2015-12-29 ENCOUNTER — Other Ambulatory Visit: Payer: Self-pay | Admitting: Family Medicine

## 2015-12-29 DIAGNOSIS — N63 Unspecified lump in unspecified breast: Secondary | ICD-10-CM

## 2016-01-07 ENCOUNTER — Ambulatory Visit
Admission: RE | Admit: 2016-01-07 | Discharge: 2016-01-07 | Disposition: A | Discharge status: Home / Self Care | Payer: PPO | Source: Ambulatory Visit | Attending: Family Medicine | Admitting: Family Medicine

## 2016-01-07 DIAGNOSIS — N63 Unspecified lump in unspecified breast: Secondary | ICD-10-CM

## 2016-01-07 DIAGNOSIS — R928 Other abnormal and inconclusive findings on diagnostic imaging of breast: Secondary | ICD-10-CM | POA: Diagnosis not present

## 2016-01-13 DIAGNOSIS — G4733 Obstructive sleep apnea (adult) (pediatric): Secondary | ICD-10-CM | POA: Diagnosis not present

## 2016-01-15 ENCOUNTER — Other Ambulatory Visit: Payer: Self-pay | Admitting: Internal Medicine

## 2016-02-10 ENCOUNTER — Telehealth: Payer: Self-pay

## 2016-02-10 NOTE — Telephone Encounter (Signed)
Per dr Alain Marion, patient needs to start prolia injections---I have submitted information into insiurance co for verification---I will call patient back with summary of benefits/copay expected

## 2016-02-11 DIAGNOSIS — E1165 Type 2 diabetes mellitus with hyperglycemia: Secondary | ICD-10-CM | POA: Diagnosis not present

## 2016-02-11 DIAGNOSIS — Z23 Encounter for immunization: Secondary | ICD-10-CM | POA: Diagnosis not present

## 2016-02-29 ENCOUNTER — Ambulatory Visit: Payer: PPO | Admitting: Adult Health

## 2016-03-14 ENCOUNTER — Other Ambulatory Visit: Payer: Self-pay | Admitting: Internal Medicine

## 2016-03-28 ENCOUNTER — Telehealth: Payer: Self-pay | Admitting: Internal Medicine

## 2016-03-28 ENCOUNTER — Other Ambulatory Visit: Payer: Self-pay | Admitting: Internal Medicine

## 2016-03-28 MED ORDER — IRBESARTAN 75 MG PO TABS: 75.0000 mg | ORAL_TABLET | Freq: Every day | ORAL | 0 refills | Status: AC

## 2016-03-28 NOTE — Telephone Encounter (Signed)
Pt scheduled to see VS on 11/28 already.  Pt wishes to keep this office visit.  Sent in rx with no refills to last until this visit.  Nothing further needed.

## 2016-03-28 NOTE — Telephone Encounter (Signed)
LMOMTCB x 1 

## 2016-03-28 NOTE — Telephone Encounter (Signed)
Dr. Melvyn Novas  Please advise  This pt. Called for refills of her avapro. I informed the patient that she would have to get that through her PCP, and she stated that she did call her pcp and he stated that he said he wouldn't be able to fill it because we were suppose to be in charge of her medications.

## 2016-03-28 NOTE — Telephone Encounter (Signed)
[phone] pt calling back

## 2016-03-28 NOTE — Telephone Encounter (Signed)
Needs to set up appt to see her PC or Tammy NP whichever she can get first and we can provide her with enough avapro  until this ov but we need either way sort out who  is prescribing it : they are  responsible for monitoring not only her bp but also her bmet.

## 2016-04-05 ENCOUNTER — Encounter: Payer: Self-pay | Admitting: Pulmonary Disease

## 2016-04-05 ENCOUNTER — Ambulatory Visit (INDEPENDENT_AMBULATORY_CARE_PROVIDER_SITE_OTHER): Payer: PPO | Admitting: Pulmonary Disease

## 2016-04-05 VITALS — BP 122/60 | HR 75 | Ht 65.0 in | Wt 236.2 lb

## 2016-04-05 DIAGNOSIS — J4520 Mild intermittent asthma, uncomplicated: Secondary | ICD-10-CM | POA: Diagnosis not present

## 2016-04-05 DIAGNOSIS — G4733 Obstructive sleep apnea (adult) (pediatric): Secondary | ICD-10-CM

## 2016-04-05 NOTE — Patient Instructions (Signed)
Can look up mask options at [email or web address]  Follow up in 1 year

## 2016-04-05 NOTE — Progress Notes (Signed)
Current Outpatient Prescriptions on File Prior to Visit  Medication Sig  . albuterol (PROVENTIL HFA;VENTOLIN HFA) 108 (90 Base) MCG/ACT inhaler Inhale 2 puffs into the lungs 4 (four) times daily as needed for wheezing or shortness of breath.  Marland Kitchen aspirin 81 MG tablet Take 81 mg by mouth daily.  Marland Kitchen azelastine (ASTELIN) 137 MCG/SPRAY nasal spray   . cholecalciferol (VITAMIN D) 1000 UNITS tablet Take 2,000 Units by mouth daily.  . insulin glargine (LANTUS) 100 UNIT/ML injection Inject 35 units into the skin in the am and 25 units into the skin in the pm per sliding scale.  . irbesartan (AVAPRO) 75 MG tablet Take 1 tablet (75 mg total) by mouth daily.  . isosorbide mononitrate (IMDUR) 60 MG 24 hr tablet Take 1 tablet (60 mg total) by mouth daily.  . Naproxen Sodium (ALEVE PO) Take 1-2 tablets by mouth every 12 (twelve) hours as needed (for leg pain).  . NOVOLOG FLEXPEN 100 UNIT/ML FlexPen Inject 25-30 Units into the skin 3 (three) times daily before meals. (Per sliding scale)  . rosuvastatin (CRESTOR) 40 MG tablet Take 40 mg by mouth daily.  . sertraline (ZOLOFT) 100 MG tablet Take 100 mg by mouth daily.   No current facility-administered medications on file prior to visit.     Chief Complaint  Patient presents with  . Follow-up    Wears BiPAP nightly. Denies any issues with mask/pressure. DME: [facility]    Pulmonary tests PFT 12/12/12 >> FEV1 2.22 (101%), FEV1% 84, TLC 3.83 (74%), DLCO 68%, no BD  Cardiac tests Echo 01/21/14 >> EF 65 to [id number], grade 1 diastolic dysfx, diastolic dysfx  Sleep tests PSG 02/04/14 >> AHI 142.3, SaO2 low 82% Bipap titration 03/17/14 >> BiPAP 20/14 cm H2O >> AHI 2.9, +R. Centrals with CPAP. Bipap 03/05/16 to 04/03/16 >> used on 30 of 30 nights with average 7 hrs 58 min.  Average AHI 2 with Bipap 14/10 cm H2O  Past medical history CAD, PVD, HLD, HTN, DM, Breast cancer, Depression  Past surgical history, Family history, Social history, Allergies reviewed.  Vital  signs BP 122/60 (BP Location: Left Arm, Cuff Size: Normal)   Pulse 75   Ht 5\' 5"  (1.651 m)   Wt 236 lb 3.2 oz (107.1 kg)   SpO2 93%   BMI 39.31 kg/m    History of Present Illness: [name] is a 75 y.o. female former smoker with severe OSA, and allergic asthma.  She has trouble with mask leak.  Otherwise doing well with Bipap.  She is not having cough, wheeze, or sputum unless her allergies flare up.  Doesn't use albuterol much.  Physical Exam:  General - No distress ENT - No sinus tenderness, no oral exudate, no LAN, MP 3, scalloped tongue Cardiac - s1s2 regular, no murmur Chest - No wheeze/rales/dullness Back - No focal tenderness Abd - Soft, non-tender Ext - No edema Neuro - Normal strength Skin - No rashes Psych - normal mood, and behavior   Assessment/Plan:  Obstructive sleep apnea. - She is compliant with BiPAP and reports benefit from therapy. - continue Bipap 14/10 cm H2O - she will call if she finds mask option she would like to change to  Obesity. - discussed importance of weight loss  Allergic asthma. - continue prn albuterol  Hypertension. - advised her to d/w PCP and cardiology about renewing avapro   Patient Instructions  Can look up mask options at [email or web address]  Follow up in 1 year   Chesley Mires, MD  [place] [facility]/Sleep Pager:  [phone] 04/05/2016, 11:37 AM

## 2016-04-06 NOTE — Telephone Encounter (Signed)
SHAY FROM [facility] IS CALLING TO ADVISE THAT THEY ARE GETTING NOTICE FROM THE [facility] THAT THE PATIEHT HAS NEVER SEEN DR [place]. REQUEST FOR PROLIA APPROVAL WAS SUBMITTED UNDER HIM, BUT I DO NOT SEE THAT THE PATIENT IS A [place] PATIENT. PLEASE CALL SHAY TO ADVISE.

## 2016-04-25 ENCOUNTER — Telehealth: Payer: Self-pay | Admitting: Pulmonary Disease

## 2016-04-25 DIAGNOSIS — G4733 Obstructive sleep apnea (adult) (pediatric): Secondary | ICD-10-CM

## 2016-04-25 NOTE — Telephone Encounter (Signed)
Spoke with the pt  She states that VS told her to call once she decided on what CPAP she wants  She states she wants to try the comfort gel blue FFM  Order sent to [facility]  Nothing further needed

## 2016-04-26 ENCOUNTER — Encounter: Payer: Self-pay | Admitting: Pulmonary Disease

## 2016-04-26 ENCOUNTER — Ambulatory Visit (INDEPENDENT_AMBULATORY_CARE_PROVIDER_SITE_OTHER): Payer: PPO | Admitting: Pulmonary Disease

## 2016-04-26 DIAGNOSIS — J45901 Unspecified asthma with (acute) exacerbation: Secondary | ICD-10-CM | POA: Diagnosis not present

## 2016-04-26 DIAGNOSIS — J209 Acute bronchitis, unspecified: Secondary | ICD-10-CM | POA: Diagnosis not present

## 2016-04-26 MED ORDER — AZITHROMYCIN 250 MG PO TABS: ORAL_TABLET | ORAL | 0 refills | Status: DC

## 2016-04-26 MED ORDER — BENZONATATE 100 MG PO CAPS: 100.0000 mg | ORAL_CAPSULE | Freq: Three times a day (TID) | ORAL | 0 refills | Status: DC | PRN

## 2016-04-26 MED ORDER — PREDNISONE 10 MG PO TABS: ORAL_TABLET | ORAL | 0 refills | Status: DC

## 2016-04-26 NOTE — Patient Instructions (Signed)
   Continue using your Albuterol inhaler every 4 to 6 hours while you are sick to help your symptoms.  Start taking Mucinex or Guaifenesin (it's active ingredient) 600mg  twice daily with plenty of liquids to help thin your mucus.  Watch your sugar on the Prednisone we are starting today because you may have to use more of your sliding scale insulin.  Call our office if your cough and breathing aren't improving in 48-72 hours.  We will have you keep your yearly appointment with Dr. Halford Chessman but be happy to see you back sooner if needed.

## 2016-04-26 NOTE — Progress Notes (Signed)
Subjective:    Patient ID: [name], female    DOB: Feb 12, 1941, 75 y.o.   MRN: [id number]  C.C.:  Follow-up for Atypical Chest Pain/Cough with Moderate COPD/Allergic Asthma.   HPI Atypical Chest Pain/Cough:  She reports starting Saturday she was dusting without cleaning sprays or liquids. She reports her symptoms started with a "hoarseness" to her voice and a sore throat. She reports she has had pressure in her ears as well as sinus drainage. She is coughing intermittently and it produces a "tearing" in her chest on the left. She reports she is unable to expectorate the mucus. She reports her granddaughter had upper respiratory symptoms similar to hers.  Moderate COPD/Allergic Asthma:  Prescribed Albuterol as needed. She has been using her inhaler more with wheezing lately. She has been using it every 6 hours for the last 4 days. Denies any nocturnal awakenings with any breathing problems because she uses her inhaler an hour before bed.   Review of Systems She reports she has had chills. She denies any fever. She has had sweats. No nausea, emesis, or diarrhea. She has had some mild myalgias. Reports she has previously tolerated a Z-pak.   Allergies  Allergen Reactions  . Saccharin Palpitations    Allergen: [id number] Dye+methocarbamol+saccharin (Saccharin)  . Amoxicillin Other (See Comments)    REACTION: gi upset  . Codeine   . Diclofenac Sodium Hives  . Dye Fdc Red  [Red Dye]   . Erythromycin Diarrhea    REACTION: gi upset  . Glipizide Other (See Comments)    REACTION: gi upset  . Metformin Diarrhea    REACTION: gi upset  . Metformin And Related   . Methocarbamol Other (See Comments)    REACTION: tachycardia  . Niacin Hives    REACTION: rash and sob  . Penicillins   . Ramipril   . Sulfa Antibiotics   . Tetanus Immune Globulin   . Voltaren  [Diclofenac]   . Tetanus Toxoid Itching, Swelling and Rash    REACTION: swelling    Current Outpatient Prescriptions on File  Prior to Visit  Medication Sig Dispense Refill  . albuterol (PROVENTIL HFA;VENTOLIN HFA) 108 (90 Base) MCG/ACT inhaler Inhale 2 puffs into the lungs 4 (four) times daily as needed for wheezing or shortness of breath. 1 Inhaler 5  . aspirin 81 MG tablet Take 81 mg by mouth daily.    Marland Kitchen azelastine (ASTELIN) 137 MCG/SPRAY nasal spray Place 1 spray into both nostrils 2 (two) times daily as needed.     . cholecalciferol (VITAMIN D) 1000 UNITS tablet Take 2,000 Units by mouth daily.    . insulin glargine (LANTUS) 100 UNIT/ML injection Inject 35 units into the skin in the am and 25 units into the skin in the pm per sliding scale.    . irbesartan (AVAPRO) 75 MG tablet Take 1 tablet (75 mg total) by mouth daily. 30 tablet 0  . isosorbide mononitrate (IMDUR) 60 MG 24 hr tablet Take 1 tablet (60 mg total) by mouth daily. 90 tablet 3  . Naproxen Sodium (ALEVE PO) Take 1-2 tablets by mouth every 12 (twelve) hours as needed (for leg pain).    . NOVOLOG FLEXPEN 100 UNIT/ML FlexPen Inject 25-30 Units into the skin 3 (three) times daily before meals. (Per sliding scale)    . rosuvastatin (CRESTOR) 40 MG tablet Take 40 mg by mouth daily.    . sertraline (ZOLOFT) 100 MG tablet Take 100 mg by mouth daily.  No current facility-administered medications on file prior to visit.     Past Medical History:  Diagnosis Date  . Asthma   . Breast cancer ([place])   . Coronary atherosclerosis   . Depression   . DM (diabetes mellitus) ([place])   . Hyperlipidemia   . Hypertension   . OSA (obstructive sleep apnea) 01/20/2014  . Osteopenia   . PVD (peripheral vascular disease) ([place])     Past Surgical History:  Procedure Laterality Date  . APPENDECTOMY    . BREAST LUMPECTOMY     1992 on left, 2007 on right  . CHOLECYSTECTOMY    . CYST REMOVAL NECK      Family History  Problem Relation Age of Onset  . Heart disease Mother   . Heart disease Father     Social History   Social History  . Marital status: Married     Spouse name: N/A  . Number of children: N/A  . Years of education: N/A   Occupational History  . retired    Social History Main Topics  . Smoking status: Former Smoker    Packs/day: 1.00    Years: 40.00    Types: Cigarettes    Quit date: 05/10/2003  . Smokeless tobacco: Never Used  . Alcohol use No  . Drug use: No  . Sexual activity: Not Asked   Other Topics Concern  . None   Social History Narrative  . None      Objective:   Physical Exam BP (!) 110/56 (BP Location: Right Arm, Cuff Size: Normal)   Pulse 67   Ht 5' 5.5" (1.664 m)   Wt 233 lb 12.8 oz (106.1 kg)   SpO2 92%   BMI 38.31 kg/m  General:  Obese. Awake. Alert. No acute distress.  Integument:  Warm & dry. No rash on exposed skin.  HEENT:  Moist mucus membranes. No oral ulcers. Mild bilateral nasal turbinate swelling. Cardiovascular:  Regular rate. No edema. Normal S1 & S2. Pulmonary:  Slightly diminished breath sounds in bilateral lung bases. Otherwise normal work of breathing on room air. Clear on auscultation. Abdomen: Soft. Normal bowel sounds. Protuberant. Musculoskeletal:  Normal bulk and tone. No joint deformity or effusion appreciated.  PFT 08/12/14: FVC 2.19 L (75%) FEV1 1.42 L (65%) FEV1/FVC 0.65 FEF 25-75 1.79 L (86%)    Assessment & Plan:  75 y.o. female with Underlying allergic asthma and evidence of moderate airway obstruction based on previous spirometry from 2016 by my review. Patient's symptoms seem to be indicating an acute bronchitis and mild exacerbation of her underlying asthma. I suspect that the etiology is likely viral but given her symptoms treatment with an anti-inflammatory antibiotic is certainly reasonable. I instructed the patient to contact our office if she had any worsening in her breathing or new symptoms despite treatment with the proposed regimen.  1. Acute Bronchitis: Treatment with a Z-Pak and Mucinex/guaifenesin 600 mg twice daily with plenty of fluids. 2. Asthma w/  Exacerbation: Treating with prednisone 40 mg by mouth daily 4 days. Continuing albuterol inhaler every 4-6 hours for symptomatic relief.  3. Follow-up: Patient will keep her follow-up in one year with Dr. Manuela Schwartz or be seen sooner if needed.  Sonia Baller Ashok Cordia, M.D. [facility] Pager:  [phone] After 3pm or if no response, call [phone] 3:22 PM 04/26/16

## 2016-05-24 DIAGNOSIS — G4733 Obstructive sleep apnea (adult) (pediatric): Secondary | ICD-10-CM | POA: Diagnosis not present

## 2016-06-14 DIAGNOSIS — E7800 Pure hypercholesterolemia, unspecified: Secondary | ICD-10-CM | POA: Diagnosis not present

## 2016-06-14 DIAGNOSIS — I10 Essential (primary) hypertension: Secondary | ICD-10-CM | POA: Diagnosis not present

## 2016-06-14 DIAGNOSIS — E1165 Type 2 diabetes mellitus with hyperglycemia: Secondary | ICD-10-CM | POA: Diagnosis not present

## 2016-07-08 ENCOUNTER — Ambulatory Visit (INDEPENDENT_AMBULATORY_CARE_PROVIDER_SITE_OTHER): Payer: PPO | Admitting: Pulmonary Disease

## 2016-07-08 ENCOUNTER — Other Ambulatory Visit (INDEPENDENT_AMBULATORY_CARE_PROVIDER_SITE_OTHER): Payer: PPO

## 2016-07-08 ENCOUNTER — Ambulatory Visit (INDEPENDENT_AMBULATORY_CARE_PROVIDER_SITE_OTHER)
Admission: RE | Admit: 2016-07-08 | Discharge: 2016-07-08 | Disposition: A | Discharge status: Home / Self Care | Payer: PPO | Source: Ambulatory Visit | Attending: Pulmonary Disease | Admitting: Pulmonary Disease

## 2016-07-08 ENCOUNTER — Encounter: Payer: Self-pay | Admitting: Pulmonary Disease

## 2016-07-08 VITALS — BP 128/70 | HR 100 | Temp 99.1°F | Ht 65.0 in | Wt 234.0 lb

## 2016-07-08 DIAGNOSIS — G4733 Obstructive sleep apnea (adult) (pediatric): Secondary | ICD-10-CM | POA: Diagnosis not present

## 2016-07-08 DIAGNOSIS — J189 Pneumonia, unspecified organism: Secondary | ICD-10-CM

## 2016-07-08 DIAGNOSIS — J439 Emphysema, unspecified: Secondary | ICD-10-CM | POA: Diagnosis not present

## 2016-07-08 DIAGNOSIS — J209 Acute bronchitis, unspecified: Secondary | ICD-10-CM | POA: Diagnosis not present

## 2016-07-08 DIAGNOSIS — J449 Chronic obstructive pulmonary disease, unspecified: Secondary | ICD-10-CM | POA: Insufficient documentation

## 2016-07-08 DIAGNOSIS — R079 Chest pain, unspecified: Secondary | ICD-10-CM | POA: Diagnosis not present

## 2016-07-08 LAB — CBC
HCT: 41.1 % (ref 36.0–46.0)
Hemoglobin: 13.6 g/dL (ref 12.0–15.0)
MCHC: 33 g/dL (ref 30.0–36.0)
MCV: 87.2 fl (ref 78.0–100.0)
Platelets: 338 10*3/uL (ref 150.0–400.0)
RBC: 4.71 Mil/uL (ref 3.87–5.11)
RDW: 14.1 % (ref 11.5–15.5)
WBC: 11.2 10*3/uL — ABNORMAL HIGH (ref 4.0–10.5)

## 2016-07-08 LAB — BRAIN NATRIURETIC PEPTIDE: Pro B Natriuretic peptide (BNP): 29 pg/mL (ref 0.0–100.0)

## 2016-07-08 MED ORDER — ALBUTEROL SULFATE HFA 108 (90 BASE) MCG/ACT IN AERS: 2.0000 | INHALATION_SPRAY | RESPIRATORY_TRACT | 2 refills | Status: DC | PRN

## 2016-07-08 MED ORDER — BUDESONIDE-FORMOTEROL FUMARATE 160-4.5 MCG/ACT IN AERO: 2.0000 | INHALATION_SPRAY | Freq: Two times a day (BID) | RESPIRATORY_TRACT | 0 refills | Status: DC

## 2016-07-08 MED ORDER — DOXYCYCLINE HYCLATE 100 MG PO TABS: 100.0000 mg | ORAL_TABLET | Freq: Two times a day (BID) | ORAL | 0 refills | Status: DC

## 2016-07-08 MED ORDER — BUDESONIDE-FORMOTEROL FUMARATE 160-4.5 MCG/ACT IN AERO: 2.0000 | INHALATION_SPRAY | Freq: Two times a day (BID) | RESPIRATORY_TRACT | 5 refills | Status: DC

## 2016-07-08 NOTE — Assessment & Plan Note (Signed)
Cont cpap (according to pt). No issues with it. Feels better using it.

## 2016-07-08 NOTE — Patient Instructions (Addendum)
It was a pleasure taking care of you today!  You are diagnosed with Chronic Obstructive Pulmonary Disease or COPD.  COPD is a preventable and treatable disease that makes it difficult to empty air out of the lungs (airflow obstruction).  This can lead to shortness of breath.   Sometimes, when you have a lung infection, this can make your breathing worse, and will cause you to have a COPD flare-up or an acute exacerbation of COPD. Please call your primary care doctor or the office if you are having a COPD flare-up.   Smoking makes COPD worse.   Make sure you use your medications for COPD -- Maintenance medications : Symbicort 160/4.5. 2 puffs, 2x/day.   Rescue medications: Albuterol 2 puffs every 4 hours as needed for shortness of breath.   Please rinse your mouth each time you use your maintenance medication.  Please call the office if you are having issues with your medications  We will start you on  doxycycline, 100 mg per tablet, 1 tablet twice a day for 7 days.   Return to clinic in 2 weeks with NP/APP or Dr. Halford Chessman.

## 2016-07-08 NOTE — Assessment & Plan Note (Addendum)
Patient with shortness of breath since December but has been busy with her husband so she only followed up today. She has had fevers, chills, nasal congestion the last 3 days. Most likely bronchitis.  Plan : 1. CXR, CBC, BMP today 2. Doxycycline 100 mg/tab, 1 tab BID 3. Pt advised to call if not better after antibiotics.

## 2016-07-08 NOTE — Addendum Note (Signed)
Addended by: Benson Setting L on: 07/08/2016 04:57 PM   Modules accepted: Orders

## 2016-07-08 NOTE — Progress Notes (Signed)
Subjective:    Patient ID: [name], female    DOB: 07/19/1940, 76 y.o.   MRN: [id number]  HPI Patient is here urgently for acute shortness of breath. She has moderate COPD/allergic asthma/OSA. She was last seen in the office in December 2017. She is on O2 2L  as needed.   Pt has been SOB with exertion since Dec 2017.  Her husband has been sick so she has not been here. She has been using Proventil 2x/d, 4x/week since December. She has had a fever x 3 days, (+) chills. (-) cough. Has sinus issues. (-) diarrhea. Some urinary issues.   (-) other infection exposure.   Pt uses her cpap at [facility].  No issues with it.    Review of Systems  Constitutional: Negative.   HENT: Negative.   Eyes: Negative.   Respiratory: Positive for cough, shortness of breath and wheezing.   Cardiovascular: Negative.   Gastrointestinal: Negative.   Endocrine: Negative.   Genitourinary: Negative.   Musculoskeletal: Negative.   Skin: Negative.    Past Medical History:  Diagnosis Date  . Asthma   . Breast cancer ([place])   . Coronary atherosclerosis   . Depression   . DM (diabetes mellitus) ([place])   . Hyperlipidemia   . Hypertension   . OSA (obstructive sleep apnea) 01/20/2014  . Osteopenia   . PVD (peripheral vascular disease) ([facility])      Family History  Problem Relation Age of Onset  . Heart disease Mother   . Heart disease Father      Past Surgical History:  Procedure Laterality Date  . APPENDECTOMY    . BREAST LUMPECTOMY     1992 on left, 2007 on right  . CHOLECYSTECTOMY    . CYST REMOVAL NECK      Social History   Social History  . Marital status: Married    Spouse name: N/A  . Number of children: N/A  . Years of education: N/A   Occupational History  . retired    Social History Main Topics  . Smoking status: Former Smoker    Packs/day: 1.00    Years: 40.00    Types: Cigarettes    Quit date: 05/10/2003  . Smokeless tobacco: Never Used  . Alcohol use No  . Drug use: No    . Sexual activity: Not on file   Other Topics Concern  . Not on file   Social History Narrative  . No narrative on file     Allergies  Allergen Reactions  . Saccharin Palpitations    Allergen: [id number] Dye+methocarbamol+saccharin (Saccharin)  . Amoxicillin Other (See Comments)    REACTION: gi upset  . Codeine   . Diclofenac Sodium Hives  . Dye Fdc Red  [Red Dye]   . Erythromycin Diarrhea    REACTION: gi upset  . Glipizide Other (See Comments)    REACTION: gi upset  . Metformin Diarrhea    REACTION: gi upset  . Metformin And Related   . Methocarbamol Other (See Comments)    REACTION: tachycardia  . Niacin Hives    REACTION: rash and sob  . Penicillins   . Ramipril   . Sulfa Antibiotics   . Tetanus Immune Globulin   . Voltaren  [Diclofenac]   . Tetanus Toxoid Itching, Swelling and Rash    REACTION: swelling     Outpatient Medications Prior to Visit  Medication Sig Dispense Refill  . albuterol (PROVENTIL HFA;VENTOLIN HFA) 108 (90 Base) MCG/ACT inhaler Inhale 2  puffs into the lungs 4 (four) times daily as needed for wheezing or shortness of breath. 1 Inhaler 5  . aspirin 81 MG tablet Take 81 mg by mouth daily.    Marland Kitchen azelastine (ASTELIN) 137 MCG/SPRAY nasal spray Place 1 spray into both nostrils 2 (two) times daily as needed.     Marland Kitchen azithromycin (ZITHROMAX) 250 MG tablet Take as directed 6 tablet 0  . cholecalciferol (VITAMIN D) 1000 UNITS tablet Take 2,000 Units by mouth daily.    . insulin glargine (LANTUS) 100 UNIT/ML injection Inject 35 units into the skin in the am and 25 units into the skin in the pm per sliding scale.    . irbesartan (AVAPRO) 75 MG tablet Take 1 tablet (75 mg total) by mouth daily. 30 tablet 0  . isosorbide mononitrate (IMDUR) 60 MG 24 hr tablet Take 1 tablet (60 mg total) by mouth daily. 90 tablet 3  . Naproxen Sodium (ALEVE PO) Take 1-2 tablets by mouth every 12 (twelve) hours as needed (for leg pain).    . NOVOLOG FLEXPEN 100 UNIT/ML FlexPen  Inject 25-30 Units into the skin 3 (three) times daily before meals. (Per sliding scale)    . rosuvastatin (CRESTOR) 40 MG tablet Take 40 mg by mouth daily.    . sertraline (ZOLOFT) 100 MG tablet Take 100 mg by mouth daily.    . benzonatate (TESSALON) 100 MG capsule Take 1-2 capsules (100-200 mg total) by mouth every 8 (eight) hours as needed for cough. 90 capsule 0  . predniSONE (DELTASONE) 10 MG tablet Take 4 tabs by mouth once daily x4 days, then 3 tabs x4 days, 2 tabs x4 days, 1 tab x4 days and stop. 40 tablet 0   No facility-administered medications prior to visit.    Meds ordered this encounter  Medications  . albuterol (PROVENTIL HFA;VENTOLIN HFA) 108 (90 Base) MCG/ACT inhaler    Sig: Inhale 2 puffs into the lungs every 4 (four) hours as needed for wheezing or shortness of breath.    Dispense:  1 Inhaler    Refill:  2         Objective:   Physical Exam  Vitals:  Vitals:   07/08/16 1523  BP: 128/70  Pulse: 100  Temp: 99.1 F (37.3 C)  TempSrc: Oral  SpO2: 90%  Weight: 234 lb (106.1 kg)  Height: 5\' 5"  (1.651 m)    Constitutional/General:  Pleasant, well-nourished, well-developed, not in any distress,  Comfortably seating.  Well kempt  Body mass index is 38.94 kg/m. Wt Readings from Last 3 Encounters:  07/08/16 234 lb (106.1 kg)  04/26/16 233 lb 12.8 oz (106.1 kg)  04/05/16 236 lb 3.2 oz (107.1 kg)     HEENT: Pupils equal and reactive to light and accommodation. Anicteric sclerae. Normal nasal mucosa.   No oral  lesions,  mouth clear,  oropharynx clear, no postnasal drip. (-) Oral thrush. No dental caries.  Airway - Mallampati class III- IV  Neck: No masses. Midline trachea. No JVD, (-) LAD. (-) bruits appreciated.  Respiratory/Chest: Grossly normal chest. (-) deformity. (-) Accessory muscle use.  Symmetric expansion. (-) Tenderness on palpation.  Resonant on percussion.  Diminished BS on both lower lung zones. (-) crackles, rhonchi Occasional wheeze in  BULF.  (-) egophony  Cardiovascular: Regular rate and  rhythm, heart sounds normal, no murmur or gallops, no peripheral edema  Gastrointestinal:  Normal bowel sounds. Soft, non-tender. No hepatosplenomegaly.  (-) masses.   Musculoskeletal:  Normal muscle tone. Normal gait.  Extremities: Grossly normal. (-) clubbing, cyanosis.  (-) edema  Skin: (-) rash,lesions seen.   Neurological/Psychiatric : alert, oriented to time, place, person. Normal mood and affect          Assessment & Plan:  Acute bronchitis Patient with shortness of breath since December but has been busy with her husband so she only followed up today. She has had fevers, chills, nasal congestion the last 3 days. Most likely bronchitis.  Plan : 1. CXR, CBC, BMP today 2. Doxycycline 100 mg/tab, 1 tab BID 3. Pt advised to call if not better after antibiotics.  COPD (chronic obstructive pulmonary disease) ([place]) Patient has been diagnosed with moderate COPD/allergic asthma. She has been winded the last 3 months but has been busy taking care of her husband. She has been using Proventil, 2 puffs, twice a day, 4 days out of 7 days.  Most likely her COPD is under treated.  Plan: 1. Symbicort, 160/4.5, 2 puffs twice a day. 2. Albuterol, 2 puffs every fours as needed for dyspnea. 3. Prednisone taper if not better in a couple days. She has prednisone at home. 4. Needs to be seen in 2 weeks. If not better, consider adding anticholinergic or switching to Trelegy.  Or, need to check her out for CHF as well. No failure sx however.  5. O2 prn to keep o2 sats > 88-90%  OSA (obstructive sleep apnea) Cont cpap (according to pt). No issues with it. Feels better using it.  Return to clinic in 2 weeks >> We need to make sure that she is better.   I spent at least 25 minutes with the patient today and more than 50% was spent counseling the patient regarding disease and management and facilitating labs and medications.      I personally reviewed previous images (Chest Xray, Chest Ct scan) done on this patient. I reviewed the reports on the images as well.  (-) PNA     J. Shirl Harris, MD 07/08/2016, 4:02 PM [place] [facility] Pager [phone] After 3 pm or if no answer, call [phone]

## 2016-07-08 NOTE — Assessment & Plan Note (Addendum)
Patient has been diagnosed with moderate COPD/allergic asthma. She has been winded the last 3 months but has been busy taking care of her husband. She has been using Proventil, 2 puffs, twice a day, 4 days out of 7 days.  Most likely her COPD is under treated.  Plan: 1. Symbicort, 160/4.5, 2 puffs twice a day. 2. Albuterol, 2 puffs every fours as needed for dyspnea. 3. Prednisone taper if not better in a couple days. She has prednisone at home. 4. Needs to be seen in 2 weeks. If not better, consider adding anticholinergic or switching to Trelegy.  Or, need to check her out for CHF as well. No failure sx however.  5. O2 prn to keep o2 sats > 88-90%

## 2016-07-22 ENCOUNTER — Encounter: Payer: Self-pay | Admitting: Adult Health

## 2016-07-22 ENCOUNTER — Ambulatory Visit (INDEPENDENT_AMBULATORY_CARE_PROVIDER_SITE_OTHER): Payer: PPO | Admitting: Adult Health

## 2016-07-22 DIAGNOSIS — J449 Chronic obstructive pulmonary disease, unspecified: Secondary | ICD-10-CM | POA: Diagnosis not present

## 2016-07-22 DIAGNOSIS — G4733 Obstructive sleep apnea (adult) (pediatric): Secondary | ICD-10-CM | POA: Diagnosis not present

## 2016-07-22 MED ORDER — GLYCOPYRROLATE 15.6 MCG IN CAPS: 1.0000 | ORAL_CAPSULE | Freq: Two times a day (BID) | RESPIRATORY_TRACT | 0 refills | Status: DC

## 2016-07-22 MED ORDER — GLYCOPYRROLATE 15.6 MCG IN CAPS: 1.0000 | ORAL_CAPSULE | Freq: Two times a day (BID) | RESPIRATORY_TRACT | 2 refills | Status: DC

## 2016-07-22 NOTE — Assessment & Plan Note (Signed)
Cont on CPAP At bedtime  

## 2016-07-22 NOTE — Assessment & Plan Note (Signed)
Recent flare now resolving  Trial of LAMA - add [name]  Unable to tolerate Symbicort   Plan  Patient Instructions  Trial of [place] 1 puff Twice daily   Mucinex DM Twice daily  As needed  Congestion .  Continue on CPAP At bedtime   Follow up with Dr. Halford Chessman  In 2 months as planned and As needed   Please contact office for sooner follow up if symptoms do not improve or worsen or seek emergency care

## 2016-07-22 NOTE — Progress Notes (Signed)
@Patient  ID: [name], female    DOB: [birth year], 76 y.o.   MRN: [id number]  Chief Complaint  Patient presents with  . Follow-up    COPD     Referring provider: Hulan Fess, MD  HPI: 76 yo female followed for Moderate COPD /Asthma /OSA   07/22/2016 Follow up : COPD  Patient presents for a two-week follow-up. Patient was seen last visit with a COPD exacerbation. She was given doxycycline 1 week. And started on Symbicort. SHe says that she is feeling better. Say that she was not able to tolerate Symbicort. Congestion have decreased.   Allergies  Allergen Reactions  . Saccharin Palpitations    Allergen: WUJ:WJXBJY Dye+methocarbamol+saccharin (Saccharin)  . Amoxicillin Other (See Comments)    REACTION: gi upset  . Codeine   . Diclofenac Sodium Hives  . Dye Fdc Red  [Red Dye]   . Erythromycin Diarrhea    REACTION: gi upset  . Glipizide Other (See Comments)    REACTION: gi upset  . Metformin Diarrhea    REACTION: gi upset  . Metformin And Related   . Methocarbamol Other (See Comments)    REACTION: tachycardia  . Niacin Hives    REACTION: rash and sob  . Penicillins   . Ramipril   . Sulfa Antibiotics   . Tetanus Immune Globulin   . Voltaren  [Diclofenac]   . Tetanus Toxoid Itching, Swelling and Rash    REACTION: swelling    Immunization History  Administered Date(s) Administered  . H1N1 04/22/2008  . Influenza Whole 02/08/2012  . Influenza, High Dose Seasonal PF 02/04/2016  . Influenza,inj,Quad PF,36+ Mos 02/01/2013  . Pneumococcal-Unspecified 12/07/2013  . Zoster 09/06/2013    Past Medical History:  Diagnosis Date  . Asthma   . Breast cancer ([place])   . Coronary atherosclerosis   . Depression   . DM (diabetes mellitus) ([place])   . Hyperlipidemia   . Hypertension   . OSA (obstructive sleep apnea) 01/20/2014  . Osteopenia   . PVD (peripheral vascular disease) ([facility])     Tobacco History: History  Smoking Status  . Former Smoker  . Packs/day:  1.00  . Years: 40.00  . Types: Cigarettes  . Quit date: 05/10/2003  Smokeless Tobacco  . Never Used   Counseling given: Not Answered   Outpatient Encounter Prescriptions as of 07/22/2016  Medication Sig  . albuterol (PROVENTIL HFA;VENTOLIN HFA) 108 (90 Base) MCG/ACT inhaler Inhale 2 puffs into the lungs 4 (four) times daily as needed for wheezing or shortness of breath.  Marland Kitchen albuterol (PROVENTIL HFA;VENTOLIN HFA) 108 (90 Base) MCG/ACT inhaler Inhale 2 puffs into the lungs every 4 (four) hours as needed for wheezing or shortness of breath.  Marland Kitchen aspirin 81 MG tablet Take 81 mg by mouth daily.  Marland Kitchen azelastine (ASTELIN) 137 MCG/SPRAY nasal spray Place 1 spray into both nostrils 2 (two) times daily as needed.   . cholecalciferol (VITAMIN D) 1000 UNITS tablet Take 2,000 Units by mouth daily.  . insulin glargine (LANTUS) 100 UNIT/ML injection Inject 35 units into the skin in the am and 25 units into the skin in the pm per sliding scale.  . irbesartan (AVAPRO) 75 MG tablet Take 1 tablet (75 mg total) by mouth daily.  . isosorbide mononitrate (IMDUR) 60 MG 24 hr tablet Take 1 tablet (60 mg total) by mouth daily.  . Naproxen Sodium (ALEVE PO) Take 1-2 tablets by mouth every 12 (twelve) hours as needed (for leg pain).  . NOVOLOG FLEXPEN 100  UNIT/ML FlexPen Inject 25-30 Units into the skin 3 (three) times daily before meals. (Per sliding scale)  . rosuvastatin (CRESTOR) 40 MG tablet Take 40 mg by mouth daily.  . sertraline (ZOLOFT) 100 MG tablet Take 100 mg by mouth daily.  . Glycopyrrolate (SEEBRI NEOHALER) 15.6 MCG CAPS Place 1 puff into inhaler and inhale 2 (two) times daily.  . Glycopyrrolate (SEEBRI NEOHALER) 15.6 MCG CAPS Place 1 puff into inhaler and inhale 2 (two) times daily.  . [DISCONTINUED] azithromycin (ZITHROMAX) 250 MG tablet Take as directed (Patient not taking: Reported on 07/22/2016)  . [DISCONTINUED] budesonide-formoterol (SYMBICORT) 160-4.5 MCG/ACT inhaler Inhale 2 puffs into the lungs 2  (two) times daily. (Patient not taking: Reported on 07/22/2016)  . [DISCONTINUED] doxycycline (VIBRA-TABS) 100 MG tablet Take 1 tablet (100 mg total) by mouth 2 (two) times daily. (Patient not taking: Reported on 07/22/2016)   No facility-administered encounter medications on file as of 07/22/2016.      Review of Systems  Constitutional:   No  weight loss, night sweats,  Fevers, chills + fatigue, or  lassitude.  HEENT:   No headaches,  Difficulty swallowing,  Tooth/dental problems, or  Sore throat,                No sneezing, itching, ear ache, nasal congestion, post nasal drip,   CV:  No chest pain,  Orthopnea, PND, swelling in lower extremities, anasarca, dizziness, palpitations, syncope.   GI  No heartburn, indigestion, abdominal pain, nausea, vomiting, diarrhea, change in bowel habits, loss of appetite, bloody stools.   Resp:    No chest wall deformity  Skin: no rash or lesions.  GU: no dysuria, change in color of urine, no urgency or frequency.  No flank pain, no hematuria   MS:  No joint pain or swelling.  No decreased range of motion.  No back pain.    Physical Exam  BP 116/72 (BP Location: Right Arm, Cuff Size: Normal)   Pulse 65   Ht 5\' 5"  (1.651 m)   Wt 231 lb 6.4 oz (105 kg)   SpO2 94%   BMI 38.51 kg/m   GEN: A/Ox3; pleasant , NAD, elderly    HEENT:  [place]/AT,  EACs-clear, TMs-wnl, NOSE-clear, THROAT-clear, no lesions, no postnasal drip or exudate noted.   NECK:  Supple w/ fair ROM; no JVD; normal carotid impulses w/o bruits; no thyromegaly or nodules palpated; no lymphadenopathy.    RESP  Decreased BS in bases  w/o, wheezes/ rales/ or rhonchi. no accessory muscle use, no dullness to percussion  CARD:  RRR, no m/r/g, tr peripheral edema, pulses intact, no cyanosis or clubbing.  GI:   Soft & nt; nml bowel sounds; no organomegaly or masses detected.   Musco: Warm bil, no deformities or joint swelling noted.   Neuro: alert, no focal deficits noted.    Skin:  Warm, no lesions or rashes    Lab Results:  CBC  BMET  BNP No results found for: BNP  ProBNP    Component Value Date/Time   PROBNP 29.0 07/08/2016 1612    Imaging: Dg Chest 2 View  Result Date: 07/08/2016 CLINICAL DATA:  Nasal drainage, chest tightness, fever for 3 days EXAM: CHEST  2 VIEW COMPARISON:  12/19/2014 FINDINGS: Cardiomediastinal silhouette is unremarkable. Stable linear scarring in lingula. Surgical clips in left axilla again noted. No infiltrate or pulmonary edema. Thoracic spine osteopenia. Degenerative changes mid and lower thoracic spine. IMPRESSION: No active cardiopulmonary disease. Stable linear scarring or atelectasis in lingula.  Osteopenia and degenerative changes thoracic spine again noted. Electronically Signed   By: Lahoma Crocker M.D.   On: 07/08/2016 16:12     Assessment & Plan:   COPD GOLD 0 vs asthma Recent flare now resolving  Trial of LAMA - add [name]  Unable to tolerate Symbicort   Plan  Patient Instructions  Trial of Seebri 1 puff Twice daily   Mucinex DM Twice daily  As needed  Congestion .  Continue on CPAP At bedtime   Follow up with Dr. Halford Chessman  In 2 months as planned and As needed   Please contact office for sooner follow up if symptoms do not improve or worsen or seek emergency care       OSA (obstructive sleep apnea) Cont on CPAP At bedtime       Rexene Edison, NP 07/22/2016

## 2016-07-22 NOTE — Patient Instructions (Addendum)
Trial of Seebri 1 puff Twice daily   Mucinex DM Twice daily  As needed  Congestion .  Continue on CPAP At bedtime   Follow up with Dr. Halford Chessman  In 2 months as planned and As needed   Please contact office for sooner follow up if symptoms do not improve or worsen or seek emergency care

## 2016-07-25 NOTE — Progress Notes (Signed)
I have reviewed and agree with assessment/plan.  Chesley Mires, MD [facility] Pulmonary/Critical Care 07/25/2016, 1:40 PM Pager:  [phone]

## 2016-07-27 ENCOUNTER — Telehealth: Payer: Self-pay | Admitting: Adult Health

## 2016-07-27 NOTE — Telephone Encounter (Signed)
[facility] called back and the PA for the seebri was done over the phone.  They will send the approval or denial to the triage fax within 72 hours.  Will hold this message in triage to follow up on PA.

## 2016-07-27 NOTE — Telephone Encounter (Signed)
I have called to attempt to do the PA for the seebri, but was on the call for 16 mins and was on hold about 12 mins.  The correct number to call since she is a medicare pt is [phone].  Will need to call that line to initiate the PA.  Will try back later.

## 2016-07-28 NOTE — Telephone Encounter (Signed)
determination has not been received yet. Will continue to hold in triage.

## 2016-08-01 NOTE — Telephone Encounter (Signed)
Contacted insurance at [phone] medication was approved from dates 07/27/2016-05/08/2017. Pt will have a copay of $150. Was told not tier excepetations could be done.   LMOM TCB x1 to pt

## 2016-08-02 MED ORDER — GLYCOPYRROLATE 15.6 MCG IN CAPS: 1.0000 | ORAL_CAPSULE | Freq: Two times a day (BID) | RESPIRATORY_TRACT | 3 refills | Status: DC

## 2016-08-02 NOTE — Telephone Encounter (Signed)
Spoke with pt, aware of rx approval.  90 day rx sent to pharmacy at pt request.  Nothing further needed.

## 2016-08-02 NOTE — Telephone Encounter (Signed)
lmtcb X2 for pt to make aware of PA approval.

## 2016-09-05 DIAGNOSIS — L821 Other seborrheic keratosis: Secondary | ICD-10-CM | POA: Diagnosis not present

## 2016-09-05 DIAGNOSIS — L304 Erythema intertrigo: Secondary | ICD-10-CM | POA: Diagnosis not present

## 2016-09-06 DIAGNOSIS — G4733 Obstructive sleep apnea (adult) (pediatric): Secondary | ICD-10-CM | POA: Diagnosis not present

## 2016-09-27 DIAGNOSIS — E119 Type 2 diabetes mellitus without complications: Secondary | ICD-10-CM | POA: Diagnosis not present

## 2016-10-06 ENCOUNTER — Ambulatory Visit: Payer: PPO | Admitting: Pulmonary Disease

## 2016-10-20 NOTE — Progress Notes (Signed)
Cardiology Office Note    Date:  10/21/2016   ID:  [name], [name] February 11, 1941, MRN [id number]  PCP:  Hulan Fess, MD  Cardiologist: [name], MD   Chief Complaint  Patient presents with  . Coronary Artery Disease    History of Present Illness:  [name] is a 76 y.o. female who presents for follow-up of coronary artery disease (high-grade distal RCA collateralized left), hypertension, obstructive sleep apnea, venous insufficiency with stasis dermatitis, right lower extremity  No specific cardiovascular complaints. She specifically denies angina. Known total occlusion of the right coronary. She gets radicular right posterior leg discomfort that awakens her from sleep. No exertional related leg discomfort. She has not needed to use nitroglycerin. She denies orthopnea and PND. She no longer smokes.   Past Medical History:  Diagnosis Date  . Asthma   . Breast cancer ([place])   . Coronary atherosclerosis   . Depression   . DM (diabetes mellitus) ([place])   . Hyperlipidemia   . Hypertension   . OSA (obstructive sleep apnea) 01/20/2014  . Osteopenia   . PVD (peripheral vascular disease) ([place])     Past Surgical History:  Procedure Laterality Date  . APPENDECTOMY    . BREAST LUMPECTOMY     1992 on left, 2007 on right  . CHOLECYSTECTOMY    . CYST REMOVAL NECK      Current Medications: Outpatient Medications Prior to Visit  Medication Sig Dispense Refill  . albuterol (PROVENTIL HFA;VENTOLIN HFA) 108 (90 Base) MCG/ACT inhaler Inhale 2 puffs into the lungs 4 (four) times daily as needed for wheezing or shortness of breath. 1 Inhaler 5  . aspirin 81 MG tablet Take 81 mg by mouth daily.    Marland Kitchen azelastine (ASTELIN) 137 MCG/SPRAY nasal spray Place 1 spray into both nostrils 2 (two) times daily as needed.     . cholecalciferol (VITAMIN D) 1000 UNITS tablet Take 2,000 Units by mouth daily.    . Glycopyrrolate (SEEBRI NEOHALER) 15.6 MCG CAPS Place 1 puff into inhaler  and inhale 2 (two) times daily. 180 capsule 3  . irbesartan (AVAPRO) 75 MG tablet Take 1 tablet (75 mg total) by mouth daily. 30 tablet 0  . isosorbide mononitrate (IMDUR) 60 MG 24 hr tablet Take 1 tablet (60 mg total) by mouth daily. 90 tablet 3  . Naproxen Sodium (ALEVE PO) Take 1-2 tablets by mouth every 12 (twelve) hours as needed (for leg pain).    . NOVOLOG FLEXPEN 100 UNIT/ML FlexPen Inject 25-30 Units into the skin 3 (three) times daily before meals. (Per sliding scale)    . rosuvastatin (CRESTOR) 40 MG tablet Take 40 mg by mouth daily.    . sertraline (ZOLOFT) 100 MG tablet Take 100 mg by mouth daily.    Marland Kitchen albuterol (PROVENTIL HFA;VENTOLIN HFA) 108 (90 Base) MCG/ACT inhaler Inhale 2 puffs into the lungs every 4 (four) hours as needed for wheezing or shortness of breath. (Patient not taking: Reported on 10/21/2016) 1 Inhaler 2  . insulin glargine (LANTUS) 100 UNIT/ML injection Inject 35 units into the skin in the am and 25 units into the skin in the pm per sliding scale.     No facility-administered medications prior to visit.      Allergies:   Saccharin; Amoxicillin; Codeine; Diclofenac sodium; Dye fdc red  [red dye]; Erythromycin; Glipizide; Metformin; Metformin and related; Methocarbamol; Niacin; Penicillins; Ramipril; Sulfa antibiotics; Tetanus immune globulin; Voltaren  [diclofenac]; and Tetanus toxoid   Social History  Social History  . Marital status: Married    Spouse name: N/A  . Number of children: N/A  . Years of education: N/A   Occupational History  . retired    Social History Main Topics  . Smoking status: Former Smoker    Packs/day: 1.00    Years: 40.00    Types: Cigarettes    Quit date: 05/10/2003  . Smokeless tobacco: Never Used  . Alcohol use No  . Drug use: No  . Sexual activity: Not Asked   Other Topics Concern  . None   Social History Narrative  . None     Family History:  The patient's family history includes Heart disease in her father and  mother.   ROS:   Please see the history of present illness.    Back discomfort, leg numbness, foot pain, diabetes with occasional hypoglycemia, a recent fall but not associated with syncope. He motion will stress due to her husband's development of dementia.  All other systems reviewed and are negative.   PHYSICAL EXAM:   VS:  BP 140/68 (BP Location: Right Arm)   Pulse 65   Ht 5' 5.5" (1.664 m)   Wt 227 lb (103 kg)   BMI 37.20 kg/m    GEN: Well nourished, well developed, in no acute distress. Morbid obesity  HEENT: normal  Neck: no JVD, carotid bruits, or masses Cardiac: RRR; 7-6/7 systolic murmur, rubs, or gallops,no edema . Respiratory:  clear to auscultation bilaterally, normal work of breathing GI: soft, nontender, nondistended, + BS MS: no deformity or atrophy  Skin: warm and dry, no rash Neuro:  Alert and Oriented x 3, Strength and sensation are intact Psych: euthymic mood, full affect  Wt Readings from Last 3 Encounters:  10/21/16 227 lb (103 kg)  07/22/16 231 lb 6.4 oz (105 kg)  07/08/16 234 lb (106.1 kg)      Studies/Labs Reviewed:   EKG:  EKG  Sinus rhythm with normal appearance. No change compared to prior.  Recent Labs: 07/08/2016: Hemoglobin 13.6; Platelets 338.0; Pro B Natriuretic peptide (BNP) 29.0   Lipid Panel No results found for: CHOL, TRIG, HDL, CHOLHDL, VLDL, LDLCALC, LDLDIRECT  Additional studies/ records that were reviewed today include:  Carotid Doppler study in 2016 less than 69% obstruction right carotid and greater than 70% left carotid.    ASSESSMENT:    1. Angina pectoris ([place])   2. Benign essential HTN   3. Coronary artery disease involving native coronary artery of native heart with angina pectoris ([place])   4. Diabetes mellitus with peripheral circulatory disorder ([facility])   5. Bilateral carotid bruits   6. Hyperlipidemia LDL goal <70      PLAN:  In order of problems listed above:  1. No complaint consistent with angina at this  time. 2. Well-controlled blood pressure with target 140 overnight he millimeters mercury or less. 3. Known chronic total occlusion of the RCA with collaterals. 4. She is working on diabetes control. Her target his hemoglobin A1c less than 7. 5. Left greater than right carotid bruit. Review of records reveal a vascular Doppler study from 2016 at the neurology office that suggests bilateral carotid obstruction. Plan repeat bilateral carotid Doppler study. 6. LDL target less than 70. Already on Crestor maximum dose. She should have Zetia added.  Bilateral carotid Doppler study. Continue aggressive diabetes control, lipid control to LDL less than 70,  Medication Adjustments/Labs and Tests Ordered: Current medicines are reviewed at length with the patient today.  Concerns  regarding medicines are outlined above.  Medication changes, Labs and Tests ordered today are listed in the Patient Instructions below. Patient Instructions  Medication Instructions:  None  Labwork: None  Testing/Procedures: Your physician has requested that you have a carotid duplex. This test is an ultrasound of the carotid arteries in your neck. It looks at blood flow through these arteries that supply the brain with blood. Allow one hour for this exam. There are no restrictions or special instructions.   Follow-Up: Your physician wants you to follow-up in: 1 year with Dr. [name].  You will receive a reminder letter in the mail two months in advance. If you don't receive a letter, please call our office to schedule the follow-up appointment.   Any Other Special Instructions Will Be Listed Below (If Applicable).     If you need a refill on your cardiac medications before your next appointment, please call your pharmacy.      Signed, [name], MD  10/21/2016 6:25 PM    [address], [place],   [address] Phone: [phone]; Fax: [phone]

## 2016-10-21 ENCOUNTER — Encounter: Payer: Self-pay | Admitting: Interventional Cardiology

## 2016-10-21 ENCOUNTER — Ambulatory Visit (INDEPENDENT_AMBULATORY_CARE_PROVIDER_SITE_OTHER): Payer: PPO | Admitting: Interventional Cardiology

## 2016-10-21 VITALS — BP 140/68 | HR 65 | Ht 65.5 in | Wt 227.0 lb

## 2016-10-21 DIAGNOSIS — I10 Essential (primary) hypertension: Secondary | ICD-10-CM

## 2016-10-21 DIAGNOSIS — I209 Angina pectoris, unspecified: Secondary | ICD-10-CM

## 2016-10-21 DIAGNOSIS — E1151 Type 2 diabetes mellitus with diabetic peripheral angiopathy without gangrene: Secondary | ICD-10-CM | POA: Diagnosis not present

## 2016-10-21 DIAGNOSIS — R0989 Other specified symptoms and signs involving the circulatory and respiratory systems: Secondary | ICD-10-CM

## 2016-10-21 DIAGNOSIS — I25119 Atherosclerotic heart disease of native coronary artery with unspecified angina pectoris: Secondary | ICD-10-CM | POA: Diagnosis not present

## 2016-10-21 DIAGNOSIS — E785 Hyperlipidemia, unspecified: Secondary | ICD-10-CM | POA: Diagnosis not present

## 2016-10-21 MED ORDER — HYDROCHLOROTHIAZIDE 25 MG PO TABS: 12.5000 mg | ORAL_TABLET | Freq: Every day | ORAL | 1 refills | Status: DC | PRN

## 2016-10-21 NOTE — Patient Instructions (Signed)
Medication Instructions:  None  Labwork: None  Testing/Procedures: Your physician has requested that you have a carotid duplex. This test is an ultrasound of the carotid arteries in your neck. It looks at blood flow through these arteries that supply the brain with blood. Allow one hour for this exam. There are no restrictions or special instructions.   Follow-Up: Your physician wants you to follow-up in: 1 year with Dr. Smith.  You will receive a reminder letter in the mail two months in advance. If you don't receive a letter, please call our office to schedule the follow-up appointment.   Any Other Special Instructions Will Be Listed Below (If Applicable).     If you need a refill on your cardiac medications before your next appointment, please call your pharmacy.   

## 2016-10-28 ENCOUNTER — Telehealth: Payer: Self-pay | Admitting: Interventional Cardiology

## 2016-10-28 NOTE — Telephone Encounter (Signed)
Spoke to patient-reports she has taken HCTZ before just as a higher dose.  Advised I would make pharmacy aware.

## 2016-10-28 NOTE — Telephone Encounter (Signed)
Returned call to pharmacy-noted the new rx for HCTZ says "dose change" and they have no prior rx for patient.  Also noted allergy for yellow dye and this rx has yellow dye in it.    Advised patient was previously on 25mg  HCTZ daily as needed per prior OV note.   Pharmacist requesting we confirm with patient.  Attempt to call patient x 2-lmtcb.

## 2016-10-28 NOTE — Telephone Encounter (Signed)
New message     Pt pharmacy is calling to find out if medication had a dose change. When they got the prescription, it said dose change, but they don't have that pt ever had this medication. It's for her hydrocholorothiazide.

## 2016-10-28 NOTE — Telephone Encounter (Signed)
Patient returning your call, thanks. °

## 2016-11-01 DIAGNOSIS — M8580 Other specified disorders of bone density and structure, unspecified site: Secondary | ICD-10-CM | POA: Diagnosis not present

## 2016-11-01 DIAGNOSIS — I10 Essential (primary) hypertension: Secondary | ICD-10-CM | POA: Diagnosis not present

## 2016-11-01 DIAGNOSIS — M899 Disorder of bone, unspecified: Secondary | ICD-10-CM | POA: Diagnosis not present

## 2016-11-03 DIAGNOSIS — Z853 Personal history of malignant neoplasm of breast: Secondary | ICD-10-CM | POA: Diagnosis not present

## 2016-11-03 DIAGNOSIS — E782 Mixed hyperlipidemia: Secondary | ICD-10-CM | POA: Diagnosis not present

## 2016-11-03 DIAGNOSIS — I25119 Atherosclerotic heart disease of native coronary artery with unspecified angina pectoris: Secondary | ICD-10-CM | POA: Diagnosis not present

## 2016-11-03 DIAGNOSIS — J449 Chronic obstructive pulmonary disease, unspecified: Secondary | ICD-10-CM | POA: Diagnosis not present

## 2016-11-03 DIAGNOSIS — R8290 Unspecified abnormal findings in urine: Secondary | ICD-10-CM | POA: Diagnosis not present

## 2016-11-03 DIAGNOSIS — Z0000 Encounter for general adult medical examination without abnormal findings: Secondary | ICD-10-CM | POA: Diagnosis not present

## 2016-11-03 DIAGNOSIS — M8580 Other specified disorders of bone density and structure, unspecified site: Secondary | ICD-10-CM | POA: Diagnosis not present

## 2016-11-03 DIAGNOSIS — F338 Other recurrent depressive disorders: Secondary | ICD-10-CM | POA: Diagnosis not present

## 2016-11-03 DIAGNOSIS — G4730 Sleep apnea, unspecified: Secondary | ICD-10-CM | POA: Diagnosis not present

## 2016-11-03 DIAGNOSIS — I10 Essential (primary) hypertension: Secondary | ICD-10-CM | POA: Diagnosis not present

## 2016-11-03 DIAGNOSIS — I779 Disorder of arteries and arterioles, unspecified: Secondary | ICD-10-CM | POA: Diagnosis not present

## 2016-11-03 DIAGNOSIS — E118 Type 2 diabetes mellitus with unspecified complications: Secondary | ICD-10-CM | POA: Diagnosis not present

## 2016-11-04 ENCOUNTER — Other Ambulatory Visit: Payer: Self-pay | Admitting: Interventional Cardiology

## 2016-11-04 ENCOUNTER — Telehealth: Payer: Self-pay | Admitting: Adult Health

## 2016-11-04 NOTE — Telephone Encounter (Signed)
Left a message for patient to call back to reschedule her appt with TP on 11/07/16 since TP will not be in the office.

## 2016-11-07 ENCOUNTER — Ambulatory Visit: Payer: PPO | Admitting: Adult Health

## 2016-11-08 ENCOUNTER — Ambulatory Visit (HOSPITAL_COMMUNITY)
Admission: RE | Admit: 2016-11-08 | Discharge: 2016-11-08 | Disposition: A | Discharge status: Home / Self Care | Payer: PPO | Source: Ambulatory Visit | Attending: Cardiology | Admitting: Cardiology

## 2016-11-08 DIAGNOSIS — R0989 Other specified symptoms and signs involving the circulatory and respiratory systems: Secondary | ICD-10-CM | POA: Insufficient documentation

## 2016-11-08 DIAGNOSIS — I6523 Occlusion and stenosis of bilateral carotid arteries: Secondary | ICD-10-CM | POA: Diagnosis not present

## 2016-11-10 DIAGNOSIS — H2513 Age-related nuclear cataract, bilateral: Secondary | ICD-10-CM | POA: Diagnosis not present

## 2016-11-10 DIAGNOSIS — H25013 Cortical age-related cataract, bilateral: Secondary | ICD-10-CM | POA: Diagnosis not present

## 2016-11-29 DIAGNOSIS — H2511 Age-related nuclear cataract, right eye: Secondary | ICD-10-CM | POA: Diagnosis not present

## 2016-11-29 DIAGNOSIS — H25011 Cortical age-related cataract, right eye: Secondary | ICD-10-CM | POA: Diagnosis not present

## 2016-11-29 DIAGNOSIS — H25811 Combined forms of age-related cataract, right eye: Secondary | ICD-10-CM | POA: Diagnosis not present

## 2016-12-12 ENCOUNTER — Ambulatory Visit (INDEPENDENT_AMBULATORY_CARE_PROVIDER_SITE_OTHER): Payer: PPO | Admitting: Adult Health

## 2016-12-12 ENCOUNTER — Encounter: Payer: Self-pay | Admitting: Adult Health

## 2016-12-12 DIAGNOSIS — J449 Chronic obstructive pulmonary disease, unspecified: Secondary | ICD-10-CM

## 2016-12-12 DIAGNOSIS — G4733 Obstructive sleep apnea (adult) (pediatric): Secondary | ICD-10-CM

## 2016-12-12 NOTE — Assessment & Plan Note (Signed)
Doing well. She was intolerant to Symbicort .  Feels much better on Seebri .  Continue to monitor . May need to change to ICS on return as most likely she is more asthmatic than COPD .

## 2016-12-12 NOTE — Patient Instructions (Addendum)
Continue on Seebri 1 puff Twice daily   Mucinex DM Twice daily  As needed  Congestion .  Continue on  BIPAP At bedtime .  Order for nasal pillows with chin strap .  Follow up with Dr. Halford Chessman  In 4-6  months as planned and As needed   Please contact office for sooner follow up if symptoms do not improve or worsen or seek emergency care

## 2016-12-12 NOTE — Addendum Note (Signed)
Addended by: Len Blalock on: 12/12/2016 04:36 PM   Modules accepted: Orders

## 2016-12-12 NOTE — Assessment & Plan Note (Signed)
Cont on BIPAP At bedtime   Work on weight loss.  Order for nasal pillows and chin strap .

## 2016-12-12 NOTE — Progress Notes (Signed)
I have reviewed and agree with assessment/plan.  Chesley Mires, MD [facility] Pulmonary/Critical Care 12/12/2016, 11:12 PM Pager:  [phone]

## 2016-12-12 NOTE — Progress Notes (Signed)
@Patient  ID: [name], female    DOB: 1941/03/29, 76 y.o.   MRN: [id number]  Chief Complaint  Patient presents with  . Follow-up    OSA     Referring provider: Hulan Fess, MD  HPI: 76 yo female former smoker followed for /Asthma /OSA   Pulmonary tests PFT 12/12/12 >> FEV1 2.22 (101%), FEV1% 84, TLC 3.83 (74%), DLCO 68%, no BD  Cardiac tests Echo 01/21/14 >> EF 65 to 54%, grade 1 diastolic dysfx, diastolic dysfx  Sleep tests PSG 02/04/14 >> AHI 142.3, SaO2 low 82% Bipap titration 03/17/14 >> BiPAP 20/14 cm H2O >> AHI 2.9, +R. Centrals with CPAP. Bipap 03/05/16 to 04/03/16 >> used on 30 of 30 nights with average 7 hrs 58 min.  Average AHI 2 with Bipap 14/10 cm H2O  12/12/2016 Follow up : Asthma /OSA  Pt returns for 6 month follow up . She was seen last ov with slow to resolve AB flare . She was intolerant to Symbicort . [name] was given as trial . She she has really liked this inhaler . Feels it has helped her so much . Rare SABA use.  She is more active and does not run out of breath.   Pt has OSA on BIPAP . She is doing very well. Wears each night . Feels rested. Mask does leak despite changing to small face mask.  Download shows excellent compliance with average usage around 7 hours. AHI 1.9. She feels rested with no significant daytime sleepiness.     Loves April Holding  A lot more active, doe s not get as sob.   Allergies  Allergen Reactions  . Saccharin Palpitations    Allergen: MGQ:QPYPPJ Dye+methocarbamol+saccharin (Saccharin)  . Amoxicillin Other (See Comments)    REACTION: gi upset  . Codeine   . Diclofenac Sodium Hives  . Dye Fdc Red  [Red Dye]   . Erythromycin Diarrhea    REACTION: gi upset  . Glipizide Other (See Comments)    REACTION: gi upset  . Metformin Diarrhea    REACTION: gi upset  . Metformin And Related   . Methocarbamol Other (See Comments)    REACTION: tachycardia  . Niacin Hives    REACTION: rash and sob  . Penicillins   .  Ramipril   . Sulfa Antibiotics   . Tetanus Immune Globulin   . Voltaren  [Diclofenac]   . Tetanus Toxoid Itching, Swelling and Rash    REACTION: swelling    Immunization History  Administered Date(s) Administered  . H1N1 04/22/2008  . Influenza Whole 02/08/2012  . Influenza, High Dose Seasonal PF 02/04/2016  . Influenza,inj,Quad PF,36+ Mos 02/01/2013  . Pneumococcal-Unspecified 12/07/2013  . Zoster 09/06/2013    Past Medical History:  Diagnosis Date  . Asthma   . Breast cancer ([place])   . Coronary atherosclerosis   . Depression   . DM (diabetes mellitus) ([place])   . Hyperlipidemia   . Hypertension   . OSA (obstructive sleep apnea) 01/20/2014  . Osteopenia   . PVD (peripheral vascular disease) ([facility])     Tobacco History: History  Smoking Status  . Former Smoker  . Packs/day: 1.00  . Years: 40.00  . Types: Cigarettes  . Quit date: 05/10/2003  Smokeless Tobacco  . Never Used   Counseling given: Not Answered   Outpatient Encounter Prescriptions as of 12/12/2016  Medication Sig  . albuterol (PROVENTIL HFA;VENTOLIN HFA) 108 (90 Base) MCG/ACT inhaler Inhale 2 puffs into the lungs 4 (four) times daily as  needed for wheezing or shortness of breath.  Marland Kitchen aspirin 81 MG tablet Take 81 mg by mouth daily.  Marland Kitchen azelastine (ASTELIN) 137 MCG/SPRAY nasal spray Place 1 spray into both nostrils 2 (two) times daily as needed.   . cholecalciferol (VITAMIN D) 1000 UNITS tablet Take 2,000 Units by mouth daily.  . Difluprednate (DUREZOL) 0.05 % EMUL Apply 1 drop to eye 3 (three) times daily.  . Glycopyrrolate (SEEBRI NEOHALER) 15.6 MCG CAPS Place 1 puff into inhaler and inhale 2 (two) times daily.  . hydrochlorothiazide (HYDRODIURIL) 25 MG tablet Take 0.5 tablets (12.5 mg total) by mouth daily as needed (swelling).  . irbesartan (AVAPRO) 75 MG tablet Take 1 tablet (75 mg total) by mouth daily.  . isosorbide mononitrate (IMDUR) 60 MG 24 hr tablet TAKE ONE TABLET BY MOUTH ONCE DAILY  . LANTUS  SOLOSTAR 100 UNIT/ML Solostar Pen Inject 30 to 35 units into the skin each morning and 20 to 30 units into the skin each evening.  . Naproxen Sodium (ALEVE PO) Take 1-2 tablets by mouth every 12 (twelve) hours as needed (for leg pain).  . NOVOLOG FLEXPEN 100 UNIT/ML FlexPen Inject 25-30 Units into the skin 3 (three) times daily before meals. (Per sliding scale)  . OXYGEN Inhale 2 L into the lungs daily as needed (shortness of breath).  . rosuvastatin (CRESTOR) 40 MG tablet Take 40 mg by mouth daily.  . sertraline (ZOLOFT) 100 MG tablet Take 100 mg by mouth daily.   No facility-administered encounter medications on file as of 12/12/2016.      Review of Systems  Constitutional:   No  weight loss, night sweats,  Fevers, chills, fatigue, or  lassitude.  HEENT:   No headaches,  Difficulty swallowing,  Tooth/dental problems, or  Sore throat,                No sneezing, itching, ear ache, + nasal congestion, post nasal drip,   CV:  No chest pain,  Orthopnea, PND, swelling in lower extremities, anasarca, dizziness, palpitations, syncope.   GI  No heartburn, indigestion, abdominal pain, nausea, vomiting, diarrhea, change in bowel habits, loss of appetite, bloody stools.   Resp: No shortness of breath with exertion or at rest.  No excess mucus, no productive cough,  No non-productive cough,  No coughing up of blood.  No change in color of mucus.  No wheezing.  No chest wall deformity  Skin: no rash or lesions.  GU: no dysuria, change in color of urine, no urgency or frequency.  No flank pain, no hematuria   MS:  No joint pain or swelling.  No decreased range of motion.  No back pain.    Physical Exam  BP 126/64 (BP Location: Right Arm, Cuff Size: Normal)   Pulse 62   Ht 5' 5.5" (1.664 m)   Wt 229 lb (103.9 kg)   SpO2 93%   BMI 37.53 kg/m   GEN: A/Ox3; pleasant , NAD, obese    HEENT:  [place]/AT,  EACs-clear, TMs-wnl, NOSE-clear, THROAT-clear, no lesions, no postnasal drip or exudate  noted. Class 2-3 MP airway   NECK:  Supple w/ fair ROM; no JVD; normal carotid impulses w/o bruits; no thyromegaly or nodules palpated; no lymphadenopathy.    RESP  Clear  P & A; w/o, wheezes/ rales/ or rhonchi. no accessory muscle use, no dullness to percussion  CARD:  RRR, no m/r/g, no peripheral edema, pulses intact, no cyanosis or clubbing.  GI:   Soft & nt; nml  bowel sounds; no organomegaly or masses detected.   Musco: Warm bil, no deformities or joint swelling noted.   Neuro: alert, no focal deficits noted.    Skin: Warm, no lesions or rashes    Lab Results:  CBC  No results found.   Assessment & Plan:   COPD GOLD 0 vs asthma Doing well. She was intolerant to Symbicort .  Feels much better on Seebri .  Continue to monitor . May need to change to ICS on return as most likely she is more asthmatic than COPD .    OSA (obstructive sleep apnea) Cont on BIPAP At bedtime   Work on weight loss.  Order for nasal pillows and chin strap .       Rexene Edison, NP 12/12/2016

## 2016-12-22 ENCOUNTER — Telehealth: Payer: Self-pay | Admitting: Adult Health

## 2016-12-22 DIAGNOSIS — J449 Chronic obstructive pulmonary disease, unspecified: Secondary | ICD-10-CM

## 2016-12-22 MED ORDER — ALBUTEROL SULFATE HFA 108 (90 BASE) MCG/ACT IN AERS: 2.0000 | INHALATION_SPRAY | Freq: Four times a day (QID) | RESPIRATORY_TRACT | 1 refills | Status: DC | PRN

## 2016-12-22 NOTE — Telephone Encounter (Signed)
Called spoke with patient who reported she is leaving on 8.28.18 to go to [place] for her granddaughter's wedding.  She is requesting to have a POC for this trip  Verified that pt uses [facility] (pt told me that she was informed by [facility] that they do carry the SimplyGo Mini) and would like the order sent there.  I did call Melissa w/ [facility] and [facility] to verify this information but will go ahead and send the order to try to get patient taken care of as quickly as possible.  Verified that pt uses 2lpm continuously. Order placed to [facility] to evaluate and titrate for POC  Nothing further needed; will sign off

## 2016-12-22 NOTE — Telephone Encounter (Signed)
Called spoke with patient who is requesting a refill on her ProAir (Ventolin is on med list but pt reports insurance covers ProAir).  Advised pt will be happy to send refills in for her - she requests 3 month supply to [facility]  Nothing further needed; will sign off

## 2017-01-17 DIAGNOSIS — H25812 Combined forms of age-related cataract, left eye: Secondary | ICD-10-CM | POA: Diagnosis not present

## 2017-01-17 DIAGNOSIS — H2512 Age-related nuclear cataract, left eye: Secondary | ICD-10-CM | POA: Diagnosis not present

## 2017-01-17 DIAGNOSIS — H25012 Cortical age-related cataract, left eye: Secondary | ICD-10-CM | POA: Diagnosis not present

## 2017-02-20 ENCOUNTER — Telehealth: Payer: Self-pay | Admitting: Pulmonary Disease

## 2017-02-20 NOTE — Telephone Encounter (Signed)
PCC's please advise. The order was placed and I guess [facility] was checking on compliance.

## 2017-02-21 NOTE — Telephone Encounter (Signed)
[name]  [name]        We received the order in August and our PAP team sent her an email letting her know that if/when she needed supplies to call them at [phone] ext [phone]. I assuming that she wasn't eligible yet in August to get new supplies through insurance.   Will you be talking to her again to give her the phone number to call or should we try calling her?  Thanks   Previous Messages    ----- Message -----  From: Harland German  Sent: 02/21/2017 10:25 AM  To: Melissa Stenson  Subject: Chin strap and nasal pillows           Hey can you check on this order patient states she still hasnt heard anything since August. [place]

## 2017-02-21 NOTE — Telephone Encounter (Signed)
Sent staff message to [facility] @ [facility]

## 2017-02-21 NOTE — Telephone Encounter (Signed)
I have called and lmom for the pt to be able to call [facility] to get her supplies ordered.  Nothing further is needed.

## 2017-02-22 DIAGNOSIS — Z23 Encounter for immunization: Secondary | ICD-10-CM | POA: Diagnosis not present

## 2017-02-22 DIAGNOSIS — E7800 Pure hypercholesterolemia, unspecified: Secondary | ICD-10-CM | POA: Diagnosis not present

## 2017-02-22 DIAGNOSIS — E1165 Type 2 diabetes mellitus with hyperglycemia: Secondary | ICD-10-CM | POA: Diagnosis not present

## 2017-02-22 DIAGNOSIS — I10 Essential (primary) hypertension: Secondary | ICD-10-CM | POA: Diagnosis not present

## 2017-03-06 DIAGNOSIS — G4733 Obstructive sleep apnea (adult) (pediatric): Secondary | ICD-10-CM | POA: Diagnosis not present

## 2017-03-15 DIAGNOSIS — E119 Type 2 diabetes mellitus without complications: Secondary | ICD-10-CM | POA: Diagnosis not present

## 2017-04-19 ENCOUNTER — Telehealth: Payer: Self-pay | Admitting: Pharmacist

## 2017-04-19 NOTE — Patient Outreach (Signed)
[facility]) Care Management  04/19/2017  Zayneb Baucum 25-Oct-1940 [id number]  76 y.o. year old female referred to [place] for Medication Adherence (Pharmacy telephone outreach)   [place] to target adherence around COPD/Asthma medications, specifically.   Patient reports that she was changed from Symbicort to [place] earlier this year and has seen a huge improvement in breathing. Is rarely using rescue inhaler now. Reports that she initially received a sample from her doctor's office but then filled a 90 day supply for the Seebri. She is now in the donut hole but should have enough [name] to last her through the end of the year.  No further pharmacy needs identified. Will sign off.   Carlean Jews, Pharm.D., BCPS PGY2 Ambulatory Care Pharmacy Resident Phone: [phone]

## 2017-06-27 DIAGNOSIS — E1165 Type 2 diabetes mellitus with hyperglycemia: Secondary | ICD-10-CM | POA: Diagnosis not present

## 2017-06-30 DIAGNOSIS — G4733 Obstructive sleep apnea (adult) (pediatric): Secondary | ICD-10-CM | POA: Diagnosis not present

## 2017-09-07 DIAGNOSIS — E669 Obesity, unspecified: Secondary | ICD-10-CM | POA: Diagnosis not present

## 2017-09-07 DIAGNOSIS — M898X1 Other specified disorders of bone, shoulder: Secondary | ICD-10-CM | POA: Diagnosis not present

## 2017-09-07 DIAGNOSIS — M25561 Pain in right knee: Secondary | ICD-10-CM | POA: Diagnosis not present

## 2017-09-07 DIAGNOSIS — Z6838 Body mass index (BMI) 38.0-38.9, adult: Secondary | ICD-10-CM | POA: Diagnosis not present

## 2017-09-07 DIAGNOSIS — M25571 Pain in right ankle and joints of right foot: Secondary | ICD-10-CM | POA: Diagnosis not present

## 2017-09-11 DIAGNOSIS — M25561 Pain in right knee: Secondary | ICD-10-CM | POA: Diagnosis not present

## 2017-10-18 ENCOUNTER — Other Ambulatory Visit: Payer: Self-pay | Admitting: *Deleted

## 2017-10-18 DIAGNOSIS — I739 Peripheral vascular disease, unspecified: Principal | ICD-10-CM

## 2017-10-18 DIAGNOSIS — I779 Disorder of arteries and arterioles, unspecified: Secondary | ICD-10-CM

## 2017-10-23 ENCOUNTER — Other Ambulatory Visit: Payer: Self-pay | Admitting: Adult Health

## 2017-10-23 ENCOUNTER — Other Ambulatory Visit: Payer: Self-pay | Admitting: Family Medicine

## 2017-10-23 DIAGNOSIS — N6489 Other specified disorders of breast: Secondary | ICD-10-CM

## 2017-10-25 ENCOUNTER — Encounter: Payer: Self-pay | Admitting: Pulmonary Disease

## 2017-10-25 ENCOUNTER — Ambulatory Visit: Payer: PPO | Admitting: Pulmonary Disease

## 2017-10-25 VITALS — BP 112/74 | HR 70 | Ht 65.0 in | Wt 225.0 lb

## 2017-10-25 DIAGNOSIS — G4733 Obstructive sleep apnea (adult) (pediatric): Secondary | ICD-10-CM

## 2017-10-25 DIAGNOSIS — J449 Chronic obstructive pulmonary disease, unspecified: Secondary | ICD-10-CM

## 2017-10-25 NOTE — Patient Instructions (Signed)
Will arrange for new Bipap mask  Follow up in 1 year

## 2017-10-25 NOTE — Progress Notes (Signed)
[facility]  Chief Complaint  Patient presents with  . Follow-up    Pt is having hard time with the mask; needs to get a new one. Overall doing well with the bipap machine.    Vital signs: BP 112/74 (BP Location: Left Arm, Cuff Size: Normal)   Pulse 70   Ht 5\' 5"  (1.651 m)   Wt 225 lb (102.1 kg)   SpO2 93%   BMI 37.44 kg/m   History of Present Illness: [name] is a 77 y.o. female with OSA on Bipap and GOLD 0 COPD.  She is doing well with Bipap.  Only issue is with mask fit.  She gets rash from her current mask.  Uses seebri.  This helps.  Not having cough, wheeze, or sputum.  Seeing ortho for Rt knee and ankle pain.   Physical Exam:  General - pleasant Eyes - pupils reactive ENT - no sinus tenderness, no oral exudate, no LAN Cardiac - regular, no murmur Chest - no wheeze, rales Abd - soft, non tender Ext - no edema Skin - no rashes Neuro - normal strength Psych - normal mood   Assessment/Plan:  Obstructive sleep apnea. - she is compliant with Bipap and reports benefit from therapy - continue Bipap 14/10 cm H2O - will arrange for her to get fitted with different Bipap mask  GOLD 0 COPD. - continue seebri   Patient Instructions  Will arrange for new Bipap mask  Follow up in 1 year    Chesley Mires, MD [place] 10/25/2017, 4:35 PM  Flow Sheet  Pulmonary tests: PFT 12/12/12 >> FEV1 2.22 (101%), FEV1% 84, TLC 3.83 (74%), DLCO 68%, no BD  Cardiac tests Echo 01/21/14 >> EF 65 to 48%, grade 1 diastolic dysfx, diastolic dysfx  Sleep tests PSG 02/04/14 >> AHI 142.3, SaO2 low 82% Bipap titration 03/17/14 >> BiPAP 20/14 cm H2O >> AHI 2.9, +R. Centrals with CPAP. Bipap 09/24/17 to 10/23/17 >> used on 30 of 30 nights with average 8 hrs 34 min.  Average AHI 0.3 with Bipap 14/10 cm H2O.  Past Medical History: She  has a past medical history of Asthma, Breast cancer ([place]), Coronary  atherosclerosis, Depression, DM (diabetes mellitus) ([place]), Hyperlipidemia, Hypertension, OSA (obstructive sleep apnea) (01/20/2014), Osteopenia, and PVD (peripheral vascular disease) ([place]).  Past Surgical History: She  has a past surgical history that includes Appendectomy; Cholecystectomy; Cyst removal neck; and Breast lumpectomy.  Family History: Her family history includes Heart disease in her father and mother.  Social History: She  reports that she quit smoking about 14 years ago. Her smoking use included cigarettes. She has a 40.00 pack-year smoking history. She has never used smokeless tobacco. She reports that she does not drink alcohol or use drugs.  Medications: Allergies as of 10/25/2017      Reactions   Saccharin Palpitations   Allergen: NIO:EVOJJK Dye+methocarbamol+saccharin (Saccharin)   Amoxicillin Other (See Comments)   REACTION: gi upset   Codeine    Diclofenac Sodium Hives   Dye Fdc Red  [red Dye]    Erythromycin Diarrhea   REACTION: gi upset   Glipizide Other (See Comments)   REACTION: gi upset   Metformin Diarrhea   REACTION: gi upset   Metformin And Related    Methocarbamol Other (See Comments)   REACTION: tachycardia   Niacin Hives   REACTION: rash and sob   Penicillins    Ramipril    Sulfa Antibiotics    Tetanus  Immune Globulin    Voltaren  [diclofenac]    Tetanus Toxoid Itching, Swelling, Rash   REACTION: swelling      Medication List        Accurate as of 10/25/17  4:35 PM. Always use your most recent med list.          albuterol 108 (90 Base) MCG/ACT inhaler Commonly known as:  PROAIR HFA Inhale 2 puffs into the lungs every 6 (six) hours as needed for wheezing or shortness of breath.   ALEVE PO Take 1-2 tablets by mouth every 12 (twelve) hours as needed (for leg pain).   aspirin 81 MG tablet Take 81 mg by mouth daily.   azelastine 0.1 % nasal spray Commonly known as:  ASTELIN Place 1 spray into both nostrils 2 (two) times daily as  needed.   cholecalciferol 1000 units tablet Commonly known as:  VITAMIN D Take 2,000 Units by mouth daily.   Glycopyrrolate 15.6 MCG Caps Commonly known as:  SEEBRI NEOHALER Place 1 puff into inhaler and inhale 2 (two) times daily.   SEEBRI NEOHALER 15.6 MCG Caps Generic drug:  Glycopyrrolate PLACE 1 PUFF ONTO INHALER AND INHALE  TWICE DAILY   irbesartan 75 MG tablet Commonly known as:  AVAPRO Take 1 tablet (75 mg total) by mouth daily.   isosorbide mononitrate 60 MG 24 hr tablet Commonly known as:  IMDUR TAKE ONE TABLET BY MOUTH ONCE DAILY   NOVOLOG FLEXPEN 100 UNIT/ML FlexPen Generic drug:  insulin aspart Inject 25-30 Units into the skin 3 (three) times daily before meals. (Per sliding scale)   OXYGEN Inhale 2 L into the lungs daily as needed (shortness of breath).   rosuvastatin 40 MG tablet Commonly known as:  CRESTOR Take 40 mg by mouth daily.   sertraline 100 MG tablet Commonly known as:  ZOLOFT Take 100 mg by mouth daily.

## 2017-10-26 ENCOUNTER — Ambulatory Visit
Admission: RE | Admit: 2017-10-26 | Discharge: 2017-10-26 | Disposition: A | Discharge status: Home / Self Care | Payer: PPO | Source: Ambulatory Visit | Attending: Family Medicine | Admitting: Family Medicine

## 2017-10-26 ENCOUNTER — Ambulatory Visit: Payer: PPO

## 2017-10-26 DIAGNOSIS — R922 Inconclusive mammogram: Secondary | ICD-10-CM | POA: Diagnosis not present

## 2017-10-26 DIAGNOSIS — N6489 Other specified disorders of breast: Secondary | ICD-10-CM

## 2017-10-26 HISTORY — DX: Personal history of irradiation: Z92.3

## 2017-10-31 ENCOUNTER — Ambulatory Visit (HOSPITAL_COMMUNITY)
Admission: RE | Admit: 2017-10-31 | Discharge: 2017-10-31 | Disposition: A | Discharge status: Home / Self Care | Payer: PPO | Source: Ambulatory Visit | Attending: Cardiovascular Disease | Admitting: Cardiovascular Disease

## 2017-10-31 DIAGNOSIS — I779 Disorder of arteries and arterioles, unspecified: Secondary | ICD-10-CM

## 2017-10-31 DIAGNOSIS — I739 Peripheral vascular disease, unspecified: Secondary | ICD-10-CM

## 2017-10-31 DIAGNOSIS — I6523 Occlusion and stenosis of bilateral carotid arteries: Secondary | ICD-10-CM | POA: Diagnosis not present

## 2017-11-01 ENCOUNTER — Telehealth: Payer: Self-pay | Admitting: *Deleted

## 2017-11-01 DIAGNOSIS — I739 Peripheral vascular disease, unspecified: Principal | ICD-10-CM

## 2017-11-01 DIAGNOSIS — I779 Disorder of arteries and arterioles, unspecified: Secondary | ICD-10-CM

## 2017-11-01 NOTE — Telephone Encounter (Signed)
-----   Message from Belva Crome, MD sent at 11/01/2017 12:28 PM EDT ----- Let the patient know there is less than 60 % stenosis bilaterally.  No action required if blockages less than 80%.  Symptoms, blockage greater than 50% require management.  Continue baby aspirin and medication for cholesterol and blood pressure.  Repeat in 1 year. A copy will be sent to Hulan Fess, MD

## 2017-11-01 NOTE — Telephone Encounter (Signed)
Left detailed message with results.  Ok per DPR. Advised to call back if any questions.   

## 2017-11-07 ENCOUNTER — Other Ambulatory Visit: Payer: Self-pay | Admitting: Interventional Cardiology

## 2017-11-07 NOTE — Telephone Encounter (Signed)
Patient has an appointment tomorrow 7/3

## 2017-11-07 NOTE — Progress Notes (Addendum)
Cardiology Office Note   Date:  11/08/2017   ID:  [name], [name] 01-11-1941, MRN [id number]  PCP:  Hulan Fess, MD  Cardiologist:  Dr. [name]    Chief Complaint  Patient presents with  . Coronary Artery Disease      History of Present Illness: [name] is a 77 y.o. female who presents for CAD.   Last visit 10/21/16  She has a hx of coronary artery disease (high-grade distal RCA collateralized left), hypertension, obstructive sleep apnea, venous insufficiency with stasis dermatitis, right lower extremity,DM, carotid stenosis,  HLD  Recent dopplers with 60% bilateral carotid stenosis.    Cath 2013 severe disstal lt coronary disease, with borderline prox to mid LAD disease, distal rt coronary is collateralized from the LCX. Normal LV function Should intervention becomes necessary in the future, however recommend either the left radial approach, or if at that time her femoral arteries have been revascularized, approaching from the legs  Today she denies chest pain, no new SOB, she has chronic SOB.  She has been having problems with her Rt knee with arthritis.  And with compensation to protect knee now with ankle issues.  With this not able to exercise.  Tries to eat healthy and no edema unless they eat out and the swelling is gone by the next AM.  Dr. Rex Kras follows her lipids. They will be done this month.   Past Medical History:  Diagnosis Date  . Asthma   . Breast cancer ([place])   . Coronary atherosclerosis   . Depression   . DM (diabetes mellitus) ([place])   . Hyperlipidemia   . Hypertension   . OSA (obstructive sleep apnea) 01/20/2014  . Osteopenia   . Personal history of radiation therapy   . PVD (peripheral vascular disease) ([place])     Past Surgical History:  Procedure Laterality Date  . APPENDECTOMY    . BREAST LUMPECTOMY     1992 on left, 2007 on right  . CHOLECYSTECTOMY    . CYST REMOVAL NECK       Current Outpatient Medications  Medication Sig  Dispense Refill  . aspirin 81 MG tablet Take 81 mg by mouth daily.    Marland Kitchen azelastine (ASTELIN) 137 MCG/SPRAY nasal spray Place 1 spray into both nostrils 2 (two) times daily as needed.     . cholecalciferol (VITAMIN D) 1000 UNITS tablet Take 2,000 Units by mouth daily.    . irbesartan (AVAPRO) 75 MG tablet Take 1 tablet (75 mg total) by mouth daily. 30 tablet 0  . isosorbide mononitrate (IMDUR) 60 MG 24 hr tablet TAKE ONE TABLET BY MOUTH ONCE DAILY 90 tablet 3  . Naproxen Sodium (ALEVE PO) Take 1-2 tablets by mouth every 12 (twelve) hours as needed (for leg pain).    . NOVOLOG FLEXPEN 100 UNIT/ML FlexPen Inject 25-30 Units into the skin 3 (three) times daily before meals. (Per sliding scale)    . OXYGEN Inhale 2 L into the lungs daily as needed (shortness of breath).    . rosuvastatin (CRESTOR) 40 MG tablet Take 40 mg by mouth daily.    Marland Kitchen SEEBRI NEOHALER 15.6 MCG CAPS PLACE 1 PUFF ONTO INHALER AND INHALE  TWICE DAILY 60 capsule 2  . sertraline (ZOLOFT) 100 MG tablet Take 100 mg by mouth daily.     No current facility-administered medications for this visit.     Allergies:   Penicillins; Saccharin; Amoxicillin; Codeine; Diclofenac sodium; Dye fdc red  [red dye]; Erythromycin; Glipizide;  Metformin; Metformin and related; Methocarbamol; Niacin; Ramipril; Sulfa antibiotics; Tetanus immune globulin; Voltaren  [diclofenac]; and Tetanus toxoid    Social History:  The patient  reports that she quit smoking about 14 years ago. Her smoking use included cigarettes. She has a 40.00 pack-year smoking history. She has never used smokeless tobacco. She reports that she does not drink alcohol or use drugs.   Family History:  The patient's family history includes Heart disease in her father and mother.    ROS:  General:no colds or fevers, mild weight decrease Skin:no rashes or ulcers HEENT:no blurred vision, no congestion CV:see HPI PUL:see HPI GI:no diarrhea constipation or melena, no indigestion GU:no  hematuria, no dysuria MS:no joint pain, no claudication Neuro:no syncope, no lightheadedness Endo:+ diabetes and with change in her diet her glucose is lower at times, she will now have juice by her bed., no thyroid disease  Wt Readings from Last 3 Encounters:  11/08/17 224 lb 12.8 oz (102 kg)  10/25/17 225 lb (102.1 kg)  12/12/16 229 lb (103.9 kg)     PHYSICAL EXAM: VS:  BP (!) 134/50   Pulse 71   Ht 5\' 5"  (1.651 m)   Wt 224 lb 12.8 oz (102 kg)   SpO2 90%   BMI 37.41 kg/m  , BMI Body mass index is 37.41 kg/m. General:Pleasant affect, NAD Skin:Warm and dry, brisk capillary refill HEENT:normocephalic, sclera clear, mucus membranes moist Neck:supple, no JVD, + bruits  Heart:S1S2 RRR without murmur, gallup, rub or click Lungs:clear without rales, rhonchi, or wheezes OIN:OMVE, non tender, + BS, do not palpate liver spleen or masses Ext:no lower ext edema, 1+ pedal pulses, 2+ radial pulses Neuro:alert and oriented, MAE, follows commands, + facial symmetry    EKG:  EKG is ordered today. The ekg ordered today demonstrates SR and normal EKG.  HR 71 , no changes.     Recent Labs: No results found for requested labs within last 8760 hours.    Lipid Panel No results found for: CHOL, TRIG, HDL, CHOLHDL, VLDL, LDLCALC, LDLDIRECT     Other studies Reviewed: Additional studies/ records that were reviewed today include: .   ASSESSMENT AND PLAN:  1.  CAD no chest pain, and stable SOB.  EKG is stable  Follow up with  Dr. [name] in 6 months.  2.  HTN controlled   3.  Carotid stenosis stable will need follow up in 1 year.   4.  OSA followed by Dr. Halford Chessman  On BiPAP, and with COPD  5.  HLD on crestor  To have check later this month with PCP, if LDL is elevated Zetia would need to be added.  LDL goal < 70  6.  DM per PCP stable.    Current medicines are reviewed with the patient today.  The patient Has no concerns regarding medicines.  The following changes have been made:   See above Labs/ tests ordered today include:see above  Disposition:   FU:  see above  Signed, Cecilie Kicks, NP  11/08/2017 9:55 AM    [address], [place], [place], [place] Phone: [phone]; Fax: [phone]

## 2017-11-08 ENCOUNTER — Ambulatory Visit: Payer: PPO | Admitting: Cardiology

## 2017-11-08 ENCOUNTER — Encounter: Payer: Self-pay | Admitting: Cardiology

## 2017-11-08 VITALS — BP 134/50 | HR 71 | Ht 65.0 in | Wt 224.8 lb

## 2017-11-08 DIAGNOSIS — E785 Hyperlipidemia, unspecified: Secondary | ICD-10-CM

## 2017-11-08 DIAGNOSIS — I739 Peripheral vascular disease, unspecified: Secondary | ICD-10-CM

## 2017-11-08 DIAGNOSIS — E1151 Type 2 diabetes mellitus with diabetic peripheral angiopathy without gangrene: Secondary | ICD-10-CM | POA: Diagnosis not present

## 2017-11-08 DIAGNOSIS — I2510 Atherosclerotic heart disease of native coronary artery without angina pectoris: Secondary | ICD-10-CM

## 2017-11-08 DIAGNOSIS — I10 Essential (primary) hypertension: Secondary | ICD-10-CM | POA: Diagnosis not present

## 2017-11-08 DIAGNOSIS — G4733 Obstructive sleep apnea (adult) (pediatric): Secondary | ICD-10-CM

## 2017-11-08 DIAGNOSIS — I779 Disorder of arteries and arterioles, unspecified: Secondary | ICD-10-CM

## 2017-11-08 NOTE — Patient Instructions (Addendum)
Medication Instructions: Your physician recommends that you continue on your current medications as directed. Please refer to the Current Medication list given to you today.   Labwork: None Ordered  Procedures/Testing: None Ordered  Follow-Up: Your physician recommends that you schedule a follow-up appointment in: 6 months with Dr.Smith    Any Additional Special Instructions Will Be Listed Below (If Applicable).     If you need a refill on your cardiac medications before your next appointment, please call your pharmacy.   

## 2017-11-23 DIAGNOSIS — I25119 Atherosclerotic heart disease of native coronary artery with unspecified angina pectoris: Secondary | ICD-10-CM | POA: Diagnosis not present

## 2017-11-23 DIAGNOSIS — Z136 Encounter for screening for cardiovascular disorders: Secondary | ICD-10-CM | POA: Diagnosis not present

## 2017-11-23 DIAGNOSIS — Z0000 Encounter for general adult medical examination without abnormal findings: Secondary | ICD-10-CM | POA: Diagnosis not present

## 2017-11-23 DIAGNOSIS — L219 Seborrheic dermatitis, unspecified: Secondary | ICD-10-CM | POA: Diagnosis not present

## 2017-11-23 DIAGNOSIS — G4730 Sleep apnea, unspecified: Secondary | ICD-10-CM | POA: Diagnosis not present

## 2017-11-23 DIAGNOSIS — I10 Essential (primary) hypertension: Secondary | ICD-10-CM | POA: Diagnosis not present

## 2017-11-23 DIAGNOSIS — E118 Type 2 diabetes mellitus with unspecified complications: Secondary | ICD-10-CM | POA: Diagnosis not present

## 2017-11-23 DIAGNOSIS — Z1389 Encounter for screening for other disorder: Secondary | ICD-10-CM | POA: Diagnosis not present

## 2017-11-23 DIAGNOSIS — E7800 Pure hypercholesterolemia, unspecified: Secondary | ICD-10-CM | POA: Diagnosis not present

## 2017-11-23 DIAGNOSIS — M8580 Other specified disorders of bone density and structure, unspecified site: Secondary | ICD-10-CM | POA: Diagnosis not present

## 2017-11-23 DIAGNOSIS — Z853 Personal history of malignant neoplasm of breast: Secondary | ICD-10-CM | POA: Diagnosis not present

## 2017-11-23 DIAGNOSIS — F338 Other recurrent depressive disorders: Secondary | ICD-10-CM | POA: Diagnosis not present

## 2017-11-23 DIAGNOSIS — I779 Disorder of arteries and arterioles, unspecified: Secondary | ICD-10-CM | POA: Diagnosis not present

## 2017-11-23 DIAGNOSIS — R350 Frequency of micturition: Secondary | ICD-10-CM | POA: Diagnosis not present

## 2017-11-23 DIAGNOSIS — Z1331 Encounter for screening for depression: Secondary | ICD-10-CM | POA: Diagnosis not present

## 2017-12-01 ENCOUNTER — Telehealth: Payer: Self-pay | Admitting: Pulmonary Disease

## 2017-12-01 NOTE — Telephone Encounter (Signed)
Per patient, everything has been handled with this and it has been taken care of.  If need to call her back, she may be reached at [phone].

## 2017-12-06 DIAGNOSIS — G4733 Obstructive sleep apnea (adult) (pediatric): Secondary | ICD-10-CM | POA: Diagnosis not present

## 2017-12-22 ENCOUNTER — Ambulatory Visit: Payer: PPO | Admitting: Interventional Cardiology

## 2018-01-04 DIAGNOSIS — M25571 Pain in right ankle and joints of right foot: Secondary | ICD-10-CM | POA: Diagnosis not present

## 2018-01-04 DIAGNOSIS — M25561 Pain in right knee: Secondary | ICD-10-CM | POA: Diagnosis not present

## 2018-01-23 ENCOUNTER — Ambulatory Visit: Payer: PPO | Admitting: Interventional Cardiology

## 2018-02-19 DIAGNOSIS — I10 Essential (primary) hypertension: Secondary | ICD-10-CM | POA: Diagnosis not present

## 2018-02-19 DIAGNOSIS — E7800 Pure hypercholesterolemia, unspecified: Secondary | ICD-10-CM | POA: Diagnosis not present

## 2018-02-19 DIAGNOSIS — E1165 Type 2 diabetes mellitus with hyperglycemia: Secondary | ICD-10-CM | POA: Diagnosis not present

## 2018-02-23 DIAGNOSIS — R300 Dysuria: Secondary | ICD-10-CM | POA: Diagnosis not present

## 2018-03-19 DIAGNOSIS — R319 Hematuria, unspecified: Secondary | ICD-10-CM | POA: Diagnosis not present

## 2018-03-19 DIAGNOSIS — E119 Type 2 diabetes mellitus without complications: Secondary | ICD-10-CM | POA: Diagnosis not present

## 2018-04-18 ENCOUNTER — Encounter: Payer: Self-pay | Admitting: Interventional Cardiology

## 2018-04-28 DIAGNOSIS — G4733 Obstructive sleep apnea (adult) (pediatric): Secondary | ICD-10-CM | POA: Diagnosis not present

## 2018-05-10 NOTE — Progress Notes (Deleted)
Cardiology Office Note:    Date:  05/10/2018   ID:  Charde, Macfarlane August 04, 1940, MRN [id number]  PCP:  Hulan Fess, MD  Cardiologist:  [name], MD   Referring MD: Hulan Fess, MD   No chief complaint on file.   History of Present Illness:    [name] is a 78 y.o. female with a hx of coronary artery disease (high-grade distal RCA collateralized left), hypertension, obstructive sleep apnea, venous insufficiency with stasis dermatitis, DM II, right lower extremity edema, and moderate (50%) bilateral carotid obstructive disease.  Past Medical History:  Diagnosis Date  . Asthma   . Breast cancer ([place])   . Coronary atherosclerosis   . Depression   . DM (diabetes mellitus) ([place])   . Hyperlipidemia   . Hypertension   . OSA (obstructive sleep apnea) 01/20/2014  . Osteopenia   . Personal history of radiation therapy   . PVD (peripheral vascular disease) ([place])     Past Surgical History:  Procedure Laterality Date  . APPENDECTOMY    . BREAST LUMPECTOMY     1992 on left, 2007 on right  . CHOLECYSTECTOMY    . CYST REMOVAL NECK      Current Medications: No outpatient medications have been marked as taking for the 05/11/18 encounter (Appointment) with Belva Crome, MD.     Allergies:   Penicillins; Saccharin; Amoxicillin; Codeine; Diclofenac sodium; Dye fdc red  [red dye]; Erythromycin; Glipizide; Metformin; Metformin and related; Methocarbamol; Niacin; Ramipril; Sulfa antibiotics; Tetanus immune globulin; Voltaren  [diclofenac]; and Tetanus toxoid   Social History   Socioeconomic History  . Marital status: Married    Spouse name: Not on file  . Number of children: Not on file  . Years of education: Not on file  . Highest education level: Not on file  Occupational History  . Occupation: retired  Scientific laboratory technician  . Financial resource strain: Not on file  . Food insecurity:    Worry: Not on file    Inability: Not on file  . Transportation needs:    Medical: Not on file    Non-medical: Not on file  Tobacco Use  . Smoking status: Former Smoker    Packs/day: 1.00    Years: 40.00    Pack years: 40.00    Types: Cigarettes    Last attempt to quit: 05/10/2003    Years since quitting: 15.0  . Smokeless tobacco: Never Used  Substance and Sexual Activity  . Alcohol use: No  . Drug use: No  . Sexual activity: Not on file  Lifestyle  . Physical activity:    Days per week: Not on file    Minutes per session: Not on file  . Stress: Not on file  Relationships  . Social connections:    Talks on phone: Not on file    Gets together: Not on file    Attends religious service: Not on file    Active member of club or organization: Not on file    Attends meetings of clubs or organizations: Not on file    Relationship status: Not on file  Other Topics Concern  . Not on file  Social History Narrative  . Not on file     Family History: The patient's family history includes Heart disease in her father and mother.  ROS:   Please see the history of present illness.    *** All other systems reviewed and are negative.  EKGs/Labs/Other Studies Reviewed:  The following studies were reviewed today: ***  EKG:  EKG is ***  Recent Labs: No results found for requested labs within last 8760 hours.  Recent Lipid Panel No results found for: CHOL, TRIG, HDL, CHOLHDL, VLDL, LDLCALC, LDLDIRECT  Physical Exam:    VS:  There were no vitals taken for this visit.    Wt Readings from Last 3 Encounters:  11/08/17 224 lb 12.8 oz (102 kg)  10/25/17 225 lb (102.1 kg)  12/12/16 229 lb (103.9 kg)     GEN: ***. No acute distress HEENT: Normal NECK: No JVD. LYMPHATICS: No lymphadenopathy CARDIAC: ***RRR.  *** murmur, gallop, edema VASCULAR: Pulses ***, Bruits *** RESPIRATORY:  Clear to auscultation without rales, wheezing or rhonchi  ABDOMEN: Soft, non-tender, non-distended, No pulsatile mass, MUSCULOSKELETAL: No deformity  SKIN: Warm and  dry NEUROLOGIC:  Alert and oriented x 3 PSYCHIATRIC:  Normal affect   ASSESSMENT:    1. Coronary artery disease involving native coronary artery of native heart with angina pectoris ([place])   2. Benign essential HTN   3. Hyperlipidemia LDL goal <70   4. OSA treated with BiPAP   5. Diabetes mellitus with peripheral circulatory disorder (HCC)   6. Bilateral carotid artery disease, unspecified type ([place])    PLAN:    In order of problems listed above:  1. ***   Medication Adjustments/Labs and Tests Ordered: Current medicines are reviewed at length with the patient today.  Concerns regarding medicines are outlined above.  No orders of the defined types were placed in this encounter.  No orders of the defined types were placed in this encounter.   There are no Patient Instructions on file for this visit.   Signed, [name], MD  05/10/2018 5:10 PM    [facility]

## 2018-05-11 ENCOUNTER — Ambulatory Visit: Payer: PPO | Admitting: Interventional Cardiology

## 2018-05-21 ENCOUNTER — Other Ambulatory Visit: Payer: Self-pay | Admitting: Adult Health

## 2018-07-05 DIAGNOSIS — E1151 Type 2 diabetes mellitus with diabetic peripheral angiopathy without gangrene: Secondary | ICD-10-CM | POA: Diagnosis not present

## 2018-07-05 DIAGNOSIS — I10 Essential (primary) hypertension: Secondary | ICD-10-CM | POA: Diagnosis not present

## 2018-07-05 DIAGNOSIS — I25119 Atherosclerotic heart disease of native coronary artery with unspecified angina pectoris: Secondary | ICD-10-CM | POA: Diagnosis not present

## 2018-07-05 DIAGNOSIS — J449 Chronic obstructive pulmonary disease, unspecified: Secondary | ICD-10-CM | POA: Diagnosis not present

## 2018-07-05 DIAGNOSIS — E118 Type 2 diabetes mellitus with unspecified complications: Secondary | ICD-10-CM | POA: Diagnosis not present

## 2018-07-05 DIAGNOSIS — Z853 Personal history of malignant neoplasm of breast: Secondary | ICD-10-CM | POA: Diagnosis not present

## 2018-07-05 DIAGNOSIS — E782 Mixed hyperlipidemia: Secondary | ICD-10-CM | POA: Diagnosis not present

## 2018-07-05 DIAGNOSIS — C50919 Malignant neoplasm of unspecified site of unspecified female breast: Secondary | ICD-10-CM | POA: Diagnosis not present

## 2018-07-05 DIAGNOSIS — C50911 Malignant neoplasm of unspecified site of right female breast: Secondary | ICD-10-CM | POA: Diagnosis not present

## 2018-07-05 DIAGNOSIS — F329 Major depressive disorder, single episode, unspecified: Secondary | ICD-10-CM | POA: Diagnosis not present

## 2018-07-09 NOTE — Progress Notes (Signed)
Cardiology Office Note:    Date:  07/10/2018   ID:  [name], [name] 1940-12-31, MRN [id number]  PCP:  Hulan Fess, MD  Cardiologist:  [name], MD   Referring MD: Hulan Fess, MD   Chief Complaint  Patient presents with  . Congestive Heart Failure  . Coronary Artery Disease  . Shortness of Breath    History of Present Illness:    [name] is a 78 y.o. female with a hx of coronary artery disease (high-grade distal RCA collateralized left), hypertension, obstructive sleep apnea, venous insufficiency with stasis dermatitis, and right lower extremity claudication.  The patient has increasing shortness of breath on exertion.  She denies orthopnea, PND, chest pain, palpitations, and syncope.  She has no lower extremity swelling.  She rarely has chest tightness if she exerts herself too vigorously.  She has no difficulty sleeping.  She has not had palpitations or syncope.  She has leg discomfort with exertion.  Past Medical History:  Diagnosis Date  . Asthma   . Breast cancer ([place])   . Coronary atherosclerosis   . Depression   . DM (diabetes mellitus) ([place])   . Hyperlipidemia   . Hypertension   . OSA (obstructive sleep apnea) 01/20/2014  . Osteopenia   . Personal history of radiation therapy   . PVD (peripheral vascular disease) ([place])     Past Surgical History:  Procedure Laterality Date  . APPENDECTOMY    . BREAST LUMPECTOMY     1992 on left, 2007 on right  . CHOLECYSTECTOMY    . CYST REMOVAL NECK      Current Medications: Current Meds  Medication Sig  . aspirin 81 MG tablet Take 81 mg by mouth daily.  Marland Kitchen azelastine (ASTELIN) 137 MCG/SPRAY nasal spray Place 1 spray into both nostrils 2 (two) times daily as needed.   . cholecalciferol (VITAMIN D) 1000 UNITS tablet Take 2,000 Units by mouth daily.  . insulin NPH-regular Human (70-30) 100 UNIT/ML injection Inject 100 Units into the skin. Twice per day  . irbesartan (AVAPRO) 75 MG tablet  Take 1 tablet (75 mg total) by mouth daily.  . isosorbide mononitrate (IMDUR) 60 MG 24 hr tablet TAKE 1 TABLET BY MOUTH ONCE DAILY  . Naproxen Sodium (ALEVE PO) Take 1-2 tablets by mouth every 12 (twelve) hours as needed (for leg pain).  . OXYGEN Inhale 2 L into the lungs daily as needed (shortness of breath).  . rosuvastatin (CRESTOR) 40 MG tablet Take 40 mg by mouth daily.  Marland Kitchen SEEBRI NEOHALER 15.6 MCG CAPS INHALE 1 PUFF INTO LUNGS TWICE DAILY  . sertraline (ZOLOFT) 100 MG tablet Take 100 mg by mouth daily.     Allergies:   Penicillins; Saccharin; Amoxicillin; Codeine; Diclofenac sodium; Dye fdc red  [red dye]; Erythromycin; Glipizide; Metformin; Metformin and related; Methocarbamol; Niacin; Ramipril; Sulfa antibiotics; Tetanus immune globulin; Voltaren  [diclofenac]; and Tetanus toxoid   Social History   Socioeconomic History  . Marital status: Married    Spouse name: Not on file  . Number of children: Not on file  . Years of education: Not on file  . Highest education level: Not on file  Occupational History  . Occupation: retired  Scientific laboratory technician  . Financial resource strain: Not on file  . Food insecurity:    Worry: Not on file    Inability: Not on file  . Transportation needs:    Medical: Not on file    Non-medical: Not on file  Tobacco Use  . Smoking status: Former Smoker    Packs/day: 1.00    Years: 40.00    Pack years: 40.00    Types: Cigarettes    Last attempt to quit: 05/10/2003    Years since quitting: 15.1  . Smokeless tobacco: Never Used  Substance and Sexual Activity  . Alcohol use: No  . Drug use: No  . Sexual activity: Not on file  Lifestyle  . Physical activity:    Days per week: Not on file    Minutes per session: Not on file  . Stress: Not on file  Relationships  . Social connections:    Talks on phone: Not on file    Gets together: Not on file    Attends religious service: Not on file    Active member of club or organization: Not on file    Attends  meetings of clubs or organizations: Not on file    Relationship status: Not on file  Other Topics Concern  . Not on file  Social History Narrative  . Not on file     Family History: The patient's family history includes Heart disease in her father and mother.  ROS:   Please see the history of present illness.    She is gaining weight.  Otherwise no complaints.  All other systems reviewed and are negative.  EKGs/Labs/Other Studies Reviewed:    The following studies were reviewed today: No recent cardiac evaluation.  Last cardiac imaging was 5 years ago.  EKG:  EKG last EKG was done in July and revealed no significant abnormality.  On November 08, 2017 when performed it revealed first-degree AV block, sinus rhythm, nonspecific T wave flattening.  Recent Labs: No results found for requested labs within last 8760 hours.  Recent Lipid Panel No results found for: CHOL, TRIG, HDL, CHOLHDL, VLDL, LDLCALC, LDLDIRECT  Physical Exam:    VS:  BP 118/60   Pulse 82   Ht 5\' 6"  (1.676 m)   Wt 229 lb (103.9 kg)   SpO2 93%   BMI 36.96 kg/m     Wt Readings from Last 3 Encounters:  07/10/18 229 lb (103.9 kg)  11/08/17 224 lb 12.8 oz (102 kg)  10/25/17 225 lb (102.1 kg)     GEN: Bees with good skin color. No acute distress HEENT: Normal NECK: No JVD. LYMPHATICS: No lymphadenopathy CARDIAC: RRR.  No murmur, no gallop, no edema VASCULAR: 2+ bilateral pulses, no bruits RESPIRATORY:  Clear to auscultation without rales, wheezing or rhonchi  ABDOMEN: Soft, non-tender, non-distended, No pulsatile mass, MUSCULOSKELETAL: No deformity  SKIN: Warm and dry NEUROLOGIC:  Alert and oriented x 3 PSYCHIATRIC:  Normal affect   ASSESSMENT:    1. Coronary artery disease involving native coronary artery of native heart with angina pectoris ([place])   2. Dyspnea on exertion   3. OSA treated with BiPAP   4. Hyperlipidemia LDL goal <70   5. Benign essential HTN   6. Diabetes mellitus with peripheral  circulatory disorder (HCC)   7. SOB (shortness of breath)    PLAN:    In order of problems listed above:  1. Stable without angina shortness of breath is an anginal equivalent which I doubt. 2. Will do a 2D Doppler echocardiogram to assess diastolic function and systolic function.  Rule out pulmonary hypertension. 3. Compliant with BiPAP 4. LDL target less than 70 is not being achieved with her most recent value of 96 in 2019, October. 5. Blood pressure target of  130/80 is being achieved. 6. Hemoglobin A1c target less than 7 7. BNP will be obtained.  Overall education and awareness concerning primary/secondary risk prevention was discussed in detail: LDL less than 70, hemoglobin A1c less than 7, blood pressure target less than 130/80 mmHg, >150 minutes of moderate aerobic activity per week, avoidance of smoking, weight control (via diet and exercise), and continued surveillance/management of/for obstructive sleep apnea.  Local follow-up will be in 1 year unless echo or BNP give evidence of heart failure.   Medication Adjustments/Labs and Tests Ordered: Current medicines are reviewed at length with the patient today.  Concerns regarding medicines are outlined above.  Orders Placed This Encounter  Procedures  . Pro b natriuretic peptide  . ECHOCARDIOGRAM COMPLETE   No orders of the defined types were placed in this encounter.   Patient Instructions  Medication Instructions:  Your physician recommends that you continue on your current medications as directed. Please refer to the Current Medication list given to you today.  If you need a refill on your cardiac medications before your next appointment, please call your pharmacy.   Lab work: Pro BNP today  If you have labs (blood work) drawn today and your tests are completely normal, you will receive your results only by: Marland Kitchen MyChart Message (if you have MyChart) OR . A paper copy in the mail If you have any lab test that is  abnormal or we need to change your treatment, we will call you to review the results.  Testing/Procedures: Your physician has requested that you have an echocardiogram. Echocardiography is a painless test that uses sound waves to create images of your heart. It provides your doctor with information about the size and shape of your heart and how well your heart's chambers and valves are working. This procedure takes approximately one hour. There are no restrictions for this procedure.   Follow-Up: At [facility], you and your health needs are our priority.  As part of our continuing mission to provide you with exceptional heart care, we have created designated Provider Care Teams.  These Care Teams include your primary Cardiologist (physician) and Advanced Practice Providers (APPs -  Physician Assistants and Nurse Practitioners) who all work together to provide you with the care you need, when you need it. You will need a follow up appointment in 12 months.  Please call our office 2 months in advance to schedule this appointment.  You may see [name], MD or one of the following Advanced Practice Providers on your designated Care Team:   Truitt Merle, NP Cecilie Kicks, NP . Kathyrn Drown, NP  Any Other Special Instructions Will Be Listed Below (If Applicable).       Signed, [name], MD  07/10/2018 3:46 PM    [place]

## 2018-07-10 ENCOUNTER — Encounter: Payer: Self-pay | Admitting: Interventional Cardiology

## 2018-07-10 ENCOUNTER — Ambulatory Visit: Payer: HMO | Admitting: Interventional Cardiology

## 2018-07-10 VITALS — BP 118/60 | HR 82 | Ht 66.0 in | Wt 229.0 lb

## 2018-07-10 DIAGNOSIS — R0600 Dyspnea, unspecified: Secondary | ICD-10-CM

## 2018-07-10 DIAGNOSIS — G4733 Obstructive sleep apnea (adult) (pediatric): Secondary | ICD-10-CM

## 2018-07-10 DIAGNOSIS — R0602 Shortness of breath: Secondary | ICD-10-CM

## 2018-07-10 DIAGNOSIS — E785 Hyperlipidemia, unspecified: Secondary | ICD-10-CM | POA: Diagnosis not present

## 2018-07-10 DIAGNOSIS — E1151 Type 2 diabetes mellitus with diabetic peripheral angiopathy without gangrene: Secondary | ICD-10-CM | POA: Diagnosis not present

## 2018-07-10 DIAGNOSIS — I10 Essential (primary) hypertension: Secondary | ICD-10-CM | POA: Diagnosis not present

## 2018-07-10 DIAGNOSIS — I25119 Atherosclerotic heart disease of native coronary artery with unspecified angina pectoris: Secondary | ICD-10-CM

## 2018-07-10 DIAGNOSIS — R0609 Other forms of dyspnea: Secondary | ICD-10-CM | POA: Diagnosis not present

## 2018-07-10 NOTE — Addendum Note (Signed)
Addended by: Loren Racer on: 07/10/2018 04:36 PM   Modules accepted: Orders

## 2018-07-10 NOTE — Patient Instructions (Signed)
Medication Instructions:  Your physician recommends that you continue on your current medications as directed. Please refer to the Current Medication list given to you today.  If you need a refill on your cardiac medications before your next appointment, please call your pharmacy.   Lab work: Pro BNP today  If you have labs (blood work) drawn today and your tests are completely normal, you will receive your results only by: Marland Kitchen MyChart Message (if you have MyChart) OR . A paper copy in the mail If you have any lab test that is abnormal or we need to change your treatment, we will call you to review the results.  Testing/Procedures: Your physician has requested that you have an echocardiogram. Echocardiography is a painless test that uses sound waves to create images of your heart. It provides your doctor with information about the size and shape of your heart and how well your heart's chambers and valves are working. This procedure takes approximately one hour. There are no restrictions for this procedure.   Follow-Up: At [facility], you and your health needs are our priority.  As part of our continuing mission to provide you with exceptional heart care, we have created designated Provider Care Teams.  These Care Teams include your primary Cardiologist (physician) and Advanced Practice Providers (APPs -  Physician Assistants and Nurse Practitioners) who all work together to provide you with the care you need, when you need it. You will need a follow up appointment in 12 months.  Please call our office 2 months in advance to schedule this appointment.  You may see [name], MD or one of the following Advanced Practice Providers on your designated Care Team:   Truitt Merle, NP Cecilie Kicks, NP . Kathyrn Drown, NP  Any Other Special Instructions Will Be Listed Below (If Applicable).

## 2018-07-11 LAB — PRO B NATRIURETIC PEPTIDE: NT-Pro BNP: 163 pg/mL (ref 0–738)

## 2018-07-16 ENCOUNTER — Other Ambulatory Visit: Payer: Self-pay

## 2018-07-16 NOTE — Patient Outreach (Signed)
  [facility]) Care Management Chronic Special Needs Program  07/16/2018  Name: [name] DOB: Oct 06, 1940  MRN: [id number]  Ms. [name] is enrolled in a chronic special needs plan for Diabetes. Client called with no answer No answer and HIPAA compliant message left. Plan for 2nd outreach call in one week Chronic care management coordinator will attempt outreach in one week.   Peter Garter RN, Jackquline Denmark, CDE Chronic Care Management Coordinator [place] [facility] [phone]

## 2018-07-18 ENCOUNTER — Other Ambulatory Visit: Payer: HMO

## 2018-07-18 NOTE — Patient Outreach (Signed)
  [facility]) Care Management Chronic Special Needs Program  07/18/2018  Name: [name] DOB: 04-07-1941  MRN: [id number]  [name] is enrolled in a chronic special needs plan for Diabetes. Client called with no answer No answer and HIPAA compliant message left. Plan for 3rd outreach call in 3-4 days Chronic care management coordinator will attempt outreach in 3-4 days.   Peter Garter RN, Jackquline Denmark, CDE Chronic Care Management Coordinator [place] [facility] [phone]

## 2018-07-20 ENCOUNTER — Other Ambulatory Visit: Payer: Self-pay

## 2018-07-20 ENCOUNTER — Ambulatory Visit (HOSPITAL_COMMUNITY): Payer: HMO | Attending: Cardiology

## 2018-07-20 DIAGNOSIS — R0602 Shortness of breath: Secondary | ICD-10-CM | POA: Diagnosis not present

## 2018-07-20 MED ORDER — PERFLUTREN LIPID MICROSPHERE
1.0000 mL | INTRAVENOUS | Status: AC | PRN
  Administered 2018-07-20: 2 mL via INTRAVENOUS

## 2018-07-23 ENCOUNTER — Other Ambulatory Visit: Payer: HMO

## 2018-07-23 NOTE — Patient Outreach (Signed)
  [facility] [facility]) Care Management Chronic Special Needs Program  07/23/2018  Name: [name] DOB: 02-Nov-1940  MRN: [id number]  [name] is enrolled in a chronic special needs plan for Diabetes. Client called with no answer No answer and HIPAA compliant message left. Plan to develop Interdisciplinary care plan if call not returned in 1-2 days Chronic care management coordinator will attempt outreach in 6 Months.   Peter Garter RN, Jackquline Denmark, CDE Chronic Care Management Coordinator [place] [facility] [phone]

## 2018-07-25 ENCOUNTER — Other Ambulatory Visit: Payer: Self-pay

## 2018-07-25 NOTE — Patient Outreach (Signed)
  [facility]) Care Management Chronic Special Needs Program  07/25/2018  Name: [name] DOB: Oct 03, 1940  MRN: [id number]  [name] is enrolled in a chronic special needs plan for Diabetes. Client has not responded to three outreach attempts.  The client's individualized care plan was developed based upon the completed health risk assessment. Plan:   . Send unsuccessful outreach letter with a copy of individualized care plan to client . Send individualized care plan to provider . Send educational materials-HTN, heart disease,COPD,  memory loss, fall safety  Chronic care management coordinator will attempt outreach in 2-4 months.    Peter Garter RN, Jackquline Denmark, CDE Chronic Care Management Coordinator [place] [facility] [phone]

## 2018-07-27 ENCOUNTER — Telehealth: Payer: Self-pay | Admitting: Interventional Cardiology

## 2018-07-27 NOTE — Telephone Encounter (Signed)
Patient returned call for echo results.  

## 2018-07-27 NOTE — Telephone Encounter (Signed)
Informed pt of results. Pt verbalized understanding. 

## 2018-09-25 DIAGNOSIS — G4733 Obstructive sleep apnea (adult) (pediatric): Secondary | ICD-10-CM | POA: Diagnosis not present

## 2018-09-25 DIAGNOSIS — J449 Chronic obstructive pulmonary disease, unspecified: Secondary | ICD-10-CM | POA: Diagnosis not present

## 2018-09-25 DIAGNOSIS — I10 Essential (primary) hypertension: Secondary | ICD-10-CM | POA: Diagnosis not present

## 2018-10-18 ENCOUNTER — Other Ambulatory Visit: Payer: Self-pay

## 2018-10-18 NOTE — Patient Outreach (Signed)
  [facility] [facility]) Care Management Chronic Special Needs Program  10/18/2018  Name: [name] DOB: [birth year]  MRN: [id number]  [name] is enrolled in a chronic special needs plan for Diabetes. Client called with no answer No answer and HIPAA compliant message left. Plan for 2nd outreach call in one week Chronic care management coordinator will attempt outreach in one week.   Peter Garter RN, Jackquline Denmark, CDE Chronic Care Management Coordinator [place] [facility] [phone]

## 2018-10-26 ENCOUNTER — Other Ambulatory Visit: Payer: Self-pay

## 2018-10-26 NOTE — Patient Outreach (Signed)
  [place] [facility]) Care Management Chronic Special Needs Program  10/26/2018  Name: [name] DOB: [birth year]  MRN: [id number]  [name] is enrolled in a chronic special needs plan for Diabetes. Client called with no answer No answer and HIPAA compliant message left. 2nd attempt Plan for 3rd outreach call in one week Chronic care management coordinator will attempt outreach in one week.   Peter Garter RN, Jackquline Denmark, CDE Chronic Care Management Coordinator [place] [facility] [phone]

## 2018-10-30 ENCOUNTER — Other Ambulatory Visit: Payer: Self-pay | Admitting: Interventional Cardiology

## 2018-10-30 DIAGNOSIS — I6523 Occlusion and stenosis of bilateral carotid arteries: Secondary | ICD-10-CM

## 2018-11-01 ENCOUNTER — Other Ambulatory Visit: Payer: Self-pay

## 2018-11-01 NOTE — Patient Outreach (Signed)
  [place] [facility]) Care Management Chronic Special Needs Program  11/01/2018  Name: [name] DOB: 08-30-1940  MRN: [id number]  [name] is enrolled in a chronic special needs plan for Diabetes. Client called with no answer No answer and HIPAA compliant message left. 3rd attempt Plan to update individualized care plan using available data in 5-10 days if client does not respond to outreach message Chronic care management coordinator will attempt outreach in 3 Months.   Peter Garter RN, Jackquline Denmark, CDE Chronic Care Management Coordinator [place] [facility] [phone]

## 2018-11-05 ENCOUNTER — Ambulatory Visit (HOSPITAL_COMMUNITY): Payer: HMO

## 2018-11-16 ENCOUNTER — Other Ambulatory Visit: Payer: Self-pay

## 2018-11-16 NOTE — Patient Outreach (Signed)
  [place] [facility]) Care Management Chronic Special Needs Program  11/16/2018  Name: [name] DOB: Sep 07, 1940  MRN: [id number]  Ms. [name] is enrolled in a chronic special needs plan for Diabetes. Reviewed and updated care plan.   Goals Addressed            This Visit's Progress   . Client understands the importance of follow-up with providers by attending scheduled visits   On track   . Client will report no fall or injuries in the next 6 months.   On track   . Client will report no worsening of symptoms related to heart disease within the next 6 months    No change   . Client will use Assistive Devices as needed and verbalize understanding of device use   On track   . Client will verbalize knowledge of chronic lung disease as evidenced by no ED visits or Inpatient stays related to chronic lung disease    On track   . Client will verbalize knowledge of self management of Hypertension as evidences by BP reading of 140/90 or less; or as defined by provider   No change   . Client/Caregiver will verbalize understanding of instructions related to self-care and safety   On track   . HEMOGLOBIN A1C < 7.0       Diabetes self management actions:  Glucose monitoring per provider recommendations  Eat Healthy  Check feet daily  Visit provider every 3-6 months as directed  Hbg A1C level every 3-6 months.  Eye Exam yearly    . Maintain timely refills of diabetic medication as prescribed within the year .   On track   . Obtain annual  Lipid Profile, LDL-C   On track   . Obtain Annual Eye (retinal)  Exam    On track   . Obtain Annual Foot Exam   On track   . Obtain annual screen for micro albuminuria (urine) , nephropathy (kidney problems)   On track   . Obtain Hemoglobin A1C at least 2 times per year   On track   . Visit Primary Care Provider or Endocrinologist at least 2 times per year    On track    Primary care visit 07/05/18      Client has not  responded to 3 outreach attempts by [facility] to update individualized care plan  The client's updated individualized care plan was developed based on available data.  Plan:  . Send unsuccessful outreach letter with a copy of updated individualized care plan to client . Send individualized care plan to provider  Chronic care management coordinator will attempt outreach in 3-4 months.    Peter Garter RN, Jackquline Denmark, CDE Chronic Care Management Coordinator [place] [facility] [phone]

## 2018-11-27 DIAGNOSIS — F329 Major depressive disorder, single episode, unspecified: Secondary | ICD-10-CM | POA: Diagnosis not present

## 2018-11-27 DIAGNOSIS — C50911 Malignant neoplasm of unspecified site of right female breast: Secondary | ICD-10-CM | POA: Diagnosis not present

## 2018-11-27 DIAGNOSIS — I10 Essential (primary) hypertension: Secondary | ICD-10-CM | POA: Diagnosis not present

## 2018-11-27 DIAGNOSIS — Z853 Personal history of malignant neoplasm of breast: Secondary | ICD-10-CM | POA: Diagnosis not present

## 2018-11-27 DIAGNOSIS — E1151 Type 2 diabetes mellitus with diabetic peripheral angiopathy without gangrene: Secondary | ICD-10-CM | POA: Diagnosis not present

## 2018-11-27 DIAGNOSIS — E782 Mixed hyperlipidemia: Secondary | ICD-10-CM | POA: Diagnosis not present

## 2018-11-27 DIAGNOSIS — J449 Chronic obstructive pulmonary disease, unspecified: Secondary | ICD-10-CM | POA: Diagnosis not present

## 2018-11-27 DIAGNOSIS — C50919 Malignant neoplasm of unspecified site of unspecified female breast: Secondary | ICD-10-CM | POA: Diagnosis not present

## 2018-12-14 DIAGNOSIS — I25119 Atherosclerotic heart disease of native coronary artery with unspecified angina pectoris: Secondary | ICD-10-CM | POA: Diagnosis not present

## 2018-12-14 DIAGNOSIS — E118 Type 2 diabetes mellitus with unspecified complications: Secondary | ICD-10-CM | POA: Diagnosis not present

## 2018-12-14 DIAGNOSIS — I779 Disorder of arteries and arterioles, unspecified: Secondary | ICD-10-CM | POA: Diagnosis not present

## 2018-12-14 DIAGNOSIS — E7800 Pure hypercholesterolemia, unspecified: Secondary | ICD-10-CM | POA: Diagnosis not present

## 2018-12-14 DIAGNOSIS — M8580 Other specified disorders of bone density and structure, unspecified site: Secondary | ICD-10-CM | POA: Diagnosis not present

## 2018-12-14 DIAGNOSIS — Z6837 Body mass index (BMI) 37.0-37.9, adult: Secondary | ICD-10-CM | POA: Diagnosis not present

## 2018-12-14 DIAGNOSIS — G4730 Sleep apnea, unspecified: Secondary | ICD-10-CM | POA: Diagnosis not present

## 2018-12-14 DIAGNOSIS — F338 Other recurrent depressive disorders: Secondary | ICD-10-CM | POA: Diagnosis not present

## 2018-12-14 DIAGNOSIS — I10 Essential (primary) hypertension: Secondary | ICD-10-CM | POA: Diagnosis not present

## 2018-12-14 DIAGNOSIS — Z853 Personal history of malignant neoplasm of breast: Secondary | ICD-10-CM | POA: Diagnosis not present

## 2018-12-14 DIAGNOSIS — J449 Chronic obstructive pulmonary disease, unspecified: Secondary | ICD-10-CM | POA: Diagnosis not present

## 2018-12-18 ENCOUNTER — Other Ambulatory Visit: Payer: Self-pay | Admitting: Family Medicine

## 2018-12-18 DIAGNOSIS — I779 Disorder of arteries and arterioles, unspecified: Secondary | ICD-10-CM

## 2018-12-18 DIAGNOSIS — Z1231 Encounter for screening mammogram for malignant neoplasm of breast: Secondary | ICD-10-CM

## 2018-12-18 DIAGNOSIS — M8580 Other specified disorders of bone density and structure, unspecified site: Secondary | ICD-10-CM

## 2018-12-24 ENCOUNTER — Other Ambulatory Visit: Payer: Self-pay | Admitting: Interventional Cardiology

## 2019-01-02 ENCOUNTER — Ambulatory Visit: Payer: HMO | Admitting: Pulmonary Disease

## 2019-01-21 ENCOUNTER — Ambulatory Visit: Payer: HMO | Admitting: Pulmonary Disease

## 2019-01-29 ENCOUNTER — Ambulatory Visit: Payer: HMO | Admitting: Pulmonary Disease

## 2019-02-06 ENCOUNTER — Other Ambulatory Visit: Payer: Self-pay | Admitting: Family Medicine

## 2019-02-22 ENCOUNTER — Other Ambulatory Visit: Payer: Self-pay

## 2019-02-22 NOTE — Patient Outreach (Signed)
  [facility]) Care Management Chronic Special Needs Program  02/22/2019  Name: Cerina Moy DOB: [birth year]  MRN: [id number]  Ms. Gessel Mike is enrolled in a chronic special needs plan for Diabetes. Client called with no answer No answer and HIPAA compliant message left. 1st attempt Plan for 2nd outreach call in one week Chronic care management coordinator will attempt outreach in one week.   Peter Garter RN, Jackquline Denmark, CDE Chronic Care Management Coordinator [place] [facility] [phone]

## 2019-02-25 ENCOUNTER — Ambulatory Visit: Payer: HMO

## 2019-02-25 ENCOUNTER — Other Ambulatory Visit: Payer: HMO

## 2019-02-28 ENCOUNTER — Ambulatory Visit: Payer: HMO | Admitting: Pulmonary Disease

## 2019-03-01 ENCOUNTER — Other Ambulatory Visit: Payer: Self-pay

## 2019-03-01 NOTE — Patient Outreach (Signed)
  [facility]) Care Management Chronic Special Needs Program  03/01/2019  Name: Jerene Cichy DOB: [birth year]  MRN: [id number]  Ms. Nakida Furuya is enrolled in a chronic special needs plan for Diabetes. Client called with no answer No answer and HIPAA compliant message left. 2nd attempt Plan for 3rd outreach call in one week Chronic care management coordinator will attempt outreach in one week.   Peter Garter RN, Jackquline Denmark, CDE Chronic Care Management Coordinator [place] [facility] [phone]

## 2019-03-11 ENCOUNTER — Other Ambulatory Visit: Payer: Self-pay

## 2019-03-11 NOTE — Patient Outreach (Signed)
  [facility]) Care Management Chronic Special Needs Program  03/11/2019  Name: [name] DOB: March 21, 1941  MRN: [id number]  [name] is enrolled in a chronic special needs plan for Diabetes.  Client called with no answer and HIPAA compliant message left.  3rd attempt  Reviewed and updated care plan.  Goals Addressed            This Visit's Progress   . Client understands the importance of follow-up with providers by attending scheduled visits   On track   . Client will report no fall or injuries in the next 6 months.   On track   . Client will report no worsening of symptoms related to heart disease within the next 6 months    On track   . Client will use Assistive Devices as needed and verbalize understanding of device use   On track   . Client will verbalize knowledge of chronic lung disease as evidenced by no ED visits or Inpatient stays related to chronic lung disease    On track   . Client will verbalize knowledge of self management of Hypertension as evidences by BP reading of 140/90 or less; or as defined by provider   On track   . Client/Caregiver will verbalize understanding of instructions related to self-care and safety   On track   . Maintain timely refills of diabetic medication as prescribed within the year .   On track   . Obtain annual  Lipid Profile, LDL-C   On track   . Obtain Annual Eye (retinal)  Exam    On track   . Obtain Annual Foot Exam   On track   . Obtain annual screen for micro albuminuria (urine) , nephropathy (kidney problems)   On track   . COMPLETED: Obtain Hemoglobin A1C at least 2 times per year       Completed 07/17/18, 12/17/18    . COMPLETED: Visit Primary Care Provider or Endocrinologist at least 2 times per year        Primary care visit 07/05/18, 12/14/18      Client has not responded to 3 outreach attempts by [facility] to update individualized care plan  The client's individualized care plan was developed based on  available data.  Plan:  . Send unsuccessful outreach letter with a copy of updated individualized care plan to client . Send individualized care plan to provider  Chronic care management coordinator will attempt outreach in 4-6 months.   Peter Garter RN, [name], CDE Chronic Care Management Coordinator [facility] [phone]

## 2019-05-07 ENCOUNTER — Emergency Department (HOSPITAL_COMMUNITY): Payer: HMO

## 2019-05-07 ENCOUNTER — Encounter (HOSPITAL_COMMUNITY): Payer: Self-pay | Admitting: Emergency Medicine

## 2019-05-07 ENCOUNTER — Emergency Department (HOSPITAL_COMMUNITY)
Admission: EM | Admit: 2019-05-07 | Discharge: 2019-05-07 | Disposition: A | Discharge status: Home / Self Care | Payer: HMO | Attending: Emergency Medicine | Admitting: Emergency Medicine

## 2019-05-07 DIAGNOSIS — J449 Chronic obstructive pulmonary disease, unspecified: Secondary | ICD-10-CM | POA: Insufficient documentation

## 2019-05-07 DIAGNOSIS — R0602 Shortness of breath: Secondary | ICD-10-CM | POA: Diagnosis not present

## 2019-05-07 DIAGNOSIS — Z5321 Procedure and treatment not carried out due to patient leaving prior to being seen by health care provider: Secondary | ICD-10-CM | POA: Diagnosis not present

## 2019-05-07 DIAGNOSIS — E119 Type 2 diabetes mellitus without complications: Secondary | ICD-10-CM | POA: Insufficient documentation

## 2019-05-07 DIAGNOSIS — R05 Cough: Secondary | ICD-10-CM | POA: Insufficient documentation

## 2019-05-07 LAB — GLUCOSE, CAPILLARY: Glucose-Capillary: 67 mg/dL — ABNORMAL LOW (ref 70–99)

## 2019-05-07 NOTE — ED Triage Notes (Signed)
Patient reports SOB with intermittent cough x3 days. States chest pain when coughing. Denies N/V/D.

## 2019-05-07 NOTE — ED Notes (Signed)
Triage RN notified of pt blood sugar. Pt given orange juice and crackers with peanut butter.

## 2019-05-07 NOTE — ED Notes (Signed)
Pt told registration that she saw her results on mychart and wants to leave.

## 2019-06-20 DIAGNOSIS — K045 Chronic apical periodontitis: Secondary | ICD-10-CM | POA: Diagnosis not present

## 2019-06-27 ENCOUNTER — Ambulatory Visit (INDEPENDENT_AMBULATORY_CARE_PROVIDER_SITE_OTHER): Payer: HMO | Admitting: Adult Health

## 2019-06-27 DIAGNOSIS — J449 Chronic obstructive pulmonary disease, unspecified: Secondary | ICD-10-CM | POA: Diagnosis not present

## 2019-06-27 DIAGNOSIS — G4733 Obstructive sleep apnea (adult) (pediatric): Secondary | ICD-10-CM

## 2019-06-27 MED ORDER — ALBUTEROL SULFATE HFA 108 (90 BASE) MCG/ACT IN AERS: 1.0000 | INHALATION_SPRAY | Freq: Four times a day (QID) | RESPIRATORY_TRACT | 3 refills | Status: AC | PRN

## 2019-06-27 NOTE — Patient Instructions (Addendum)
Continue on  BIPAP At bedtime .  Keep up the good work Do not drive if sleepy Work on Hospital doctor as needed for wheezing or shortness of breath Follow-up in 6 months and as needed with Dr. Halford Chessman or Royal Piedra NP

## 2019-06-27 NOTE — Progress Notes (Signed)
Reviewed and agree with assessment/plan.   Ashar Lewinski, MD [facility] 05/04/2016, 12:24 PM Pager:  [phone]  

## 2019-06-27 NOTE — Progress Notes (Signed)
Virtual Visit via Telephone Note  I connected with [name] on 06/27/19 at  2:30 PM EST by telephone and verified that I am speaking with the correct person using two identifiers.  Location: Patient: Home  Provider: Home    I discussed the limitations, risks, security and privacy concerns of performing an evaluation and management service by telephone and the availability of in person appointments. I also discussed with the patient that there may be a patient responsible charge related to this service. The patient expressed understanding and agreed to proceed.   History of Present Illness: 79 year old female followed for obstructive sleep apnea on nocturnal BiPAP.  She has mild COPD.   Today's televisit is a follow-up for sleep apnea and COPD.  Patient is on nocturnal BiPAP.  Says she is doing well on BiPAP.  She feels rested with no significant daytime sleepiness.  BiPAP download shows excellent compliance with 100% usage.  Daily average usage at 10.5 hours.  Patient is on IPAP 14, EPAP 10 cm H2O.  AHI of 1.8.  Positive leaks.    Patient has very mild COPD . Was previously on Se[place]ri , but no longer taking . Uses proair on occasion but rarely.  She says overall her breathing has been doing okay . Gets winded with heavy activity . Independent , drives , does light chores.  She does light activities without issue.  She denies a flare of cough or wheezing.     Patient Active Problem List   Diagnosis Date Noted  . COPD (chronic obstructive pulmonary disease) ([place]) 07/08/2016  . Acute bronchitis 04/26/2016  . Asthma with acute exacerbation 04/26/2016  . Obesity (BMI 30-39.9) 12/20/2014  . Angina pectoris ([place]) 11/11/2014  . Breast cancer, female ([place]) 11/11/2014  . Benign neoplasm of colon 11/11/2014  . CAFL (chronic airflow limitation) ([place]) 11/11/2014  . Clinical depression 11/11/2014  . Major depressive episode 11/11/2014  . Diabetes mellitus with peripheral circulatory  disorder ([place]) 11/11/2014  . Diabetes mellitus, type 2 ([place]) 11/11/2014  . Diabetes mellitus type 2, uncontrolled ([place]) 11/11/2014  . Benign essential HTN 11/11/2014  . Combined fat and carbohydrate induced hyperlipemia 11/11/2014  . Adiposity 11/11/2014  . Bone/cartilage disorder 11/11/2014  . Blood clot associated with vein wall inflammation 11/11/2014  . Hypercholesterolemia without hypertriglyceridemia 11/11/2014  . Peripheral blood vessel disorder ([place]) 11/11/2014  . Left foot pain 11/11/2014  . Dysesthesia 11/11/2014  . Carotid bruit 11/11/2014  . COPD GOLD 0 vs asthma 08/26/2014  . Venous insufficiency of right lower extremity 08/26/2014  . OSA (obstructive sleep apnea) 01/20/2014  . Type II diabetes mellitus with complication ([place]) 123456  . Abnormal echocardiogram 12/11/2013  . Dermatitis, eczematoid 12/11/2013  . Coronary artery disease involving native coronary artery of native heart with angina pectoris ([place]) 06/10/2013  . Nocturnal hypoxemia 10/24/2012  . Hyperlipidemia 05/22/2008  . Dyspnea 05/22/2008  . NEOPLASM, MALIGNANT, BREAST, HX OF 05/22/2008    Current Outpatient Medications on File Prior to Visit  Medication Sig Dispense Refill  . aspirin 81 MG tablet Take 81 mg by mouth daily.    Marland Kitchen azelastine (ASTELIN) 137 MCG/SPRAY nasal spray Place 1 spray into both nostrils 2 (two) times daily as needed.     . cholecalciferol (VITAMIN D) 1000 UNITS tablet Take 2,000 Units by mouth daily.    . insulin NPH Human (NOVOLIN N) 100 UNIT/ML injection Inject into the skin. Sliding scale with meals    . insulin regular (NOVOLIN R,HUMULIN R) 100 units/mL injection  Inject into the skin 3 (three) times daily before meals. Sliding scale    . irbesartan (AVAPRO) 75 MG tablet Take 1 tablet (75 mg total) by mouth daily. 30 tablet 0  . isosorbide mononitrate (IMDUR) 60 MG 24 hr tablet Take 1 tablet by mouth once daily 90 tablet 2  . Naproxen Sodium (ALEVE PO) Take 1-2 tablets by mouth  every 12 (twelve) hours as needed (for leg pain).    . rosuvastatin (CRESTOR) 40 MG tablet Take 40 mg by mouth daily.    Marland Kitchen SEEBRI NEOHALER 15.6 MCG CAPS INHALE 1 PUFF INTO LUNGS TWICE DAILY 60 capsule 5  . sertraline (ZOLOFT) 100 MG tablet Take 100 mg by mouth daily.     No current facility-administered medications on file prior to visit.    Observations/Objective: Speaks in full sentences with no audible distress or wheezing  Assessment and Plan: Obstructive sleep apnea with excellent control on nocturnal BiPAP  Mild COPD currently under good control on no maintenance regimen. Patient is continue on ProAir.  Advised if starts to see increased symptom burden to call back or office for sooner follow-up  Plan  Patient Instructions  Continue on  BIPAP At bedtime .    Follow up with Dr. Halford Chessman  In 4-6  months as planned and As needed   Please contact office for sooner follow up if symptoms do not improve or worsen or seek emergency care   Order for nasal pillows with chin strap .  Follow up with Dr. Halford Chessman  In 4-6  months as planned and As needed   Please contact office for sooner follow up if symptoms do not improve or worsen or seek emergency care        Follow Up Instructions: Follow-up in 6 months and as needed    I discussed the assessment and treatment plan with the patient. The patient was provided an opportunity to ask questions and all were answered. The patient agreed with the plan and demonstrated an understanding of the instructions.   The patient was advised to call back or seek an in-person evaluation if the symptoms worsen or if the condition fails to improve as anticipated.  I provided 21  minutes of non-face-to-face time during this encounter.   Rexene Edison, NP

## 2019-06-28 ENCOUNTER — Other Ambulatory Visit: Payer: Self-pay

## 2019-06-28 ENCOUNTER — Ambulatory Visit: Payer: HMO | Admitting: Interventional Cardiology

## 2019-06-28 NOTE — Patient Outreach (Signed)
  [facility]) Care Management Chronic Special Needs Program  06/28/2019  Name: Saryn Panick DOB: 02-Apr-1941  MRN: [id number]  Ms. Charina Dickes is enrolled in a chronic special needs plan for Diabetes. Client called with no answer No answer and HIPAA compliant message left. 1st attempt Plan for 2nd outreach call in one week Chronic care management coordinator will attempt outreach in one week.   Peter Garter RN, Jackquline Denmark, CDE Chronic Care Management Coordinator [place] [facility] [phone]

## 2019-07-05 ENCOUNTER — Other Ambulatory Visit: Payer: Self-pay

## 2019-07-05 NOTE — Patient Outreach (Signed)
  [facility]) Care Management Chronic Special Needs Program  07/05/2019  Name: [name] DOB: [birth year]  MRN: [id number]  Ms. [name] is enrolled in a chronic special needs plan for Diabetes. Client called with no answer No answer and HIPAA compliant message left. 2nd attempt Plan for 3rd outreach call in one week Chronic care management coordinator will attempt outreach in one week.   Peter Garter RN, Jackquline Denmark, CDE Chronic Care Management Coordinator [place] [facility] [phone]

## 2019-07-08 ENCOUNTER — Ambulatory Visit: Payer: HMO

## 2019-07-12 ENCOUNTER — Other Ambulatory Visit: Payer: Self-pay

## 2019-07-12 ENCOUNTER — Ambulatory Visit: Payer: HMO

## 2019-07-12 NOTE — Patient Outreach (Signed)
[facility]) Care Management Chronic Special Needs Program  07/12/2019  Name: Keita Nam DOB: 20-Oct-1940  MRN: [id number]  Ms. Talyssa Garbers is enrolled in a chronic special needs plan for  Diabetes.  Client called with no answer and HIPAA compliant message left.  3rd attempt A completed health risk assessment has not been received from the client and client has not responded to 3 outreach attempts by [facility] to complete 2021 Health Risk Assessment  The client's individualized care plan was developed based on available data and previous 2020 Health Risk Assessment Goals Addressed            This Visit's Progress   . Client understands the importance of follow-up with providers by attending scheduled visits   On track    Plan to keep scheduled appointments with providers    . Client will report no fall or injuries in the next 6 months.   On track    Stand up slowly Use cane or walker at all times Use grabber to pick up objects on the floor Keep home well-lit and wear your glasses  Sent  EMM: Preventing falls    . Client will report no worsening of symptoms related to heart disease within the next 6 months    On track    No ED or hospitalizations for heart disease Call 911 for severe symptoms Follow a low salt diet Sent EMMI: Heart Disease in diabetics    . Client will use Assistive Devices as needed and verbalize understanding of device use   On track    [employer] approved meters-One Touch, Freestyle or Precision     . Client will verbalize knowledge of chronic lung disease as evidenced by no ED visits or Inpatient stays related to chronic lung disease    On track    No ED visits or hospitalizations for COPD  Plan to use inhalers as directed Plan to review COPD action plan in the [employer](HTA) calendar Send EMMI: COPD: What you can do    . Client will verbalize knowledge of self management of Hypertension as evidences by BP  reading of 140/90 or less; or as defined by provider   On track    Plan to check B/P regularly Take B/P medications as ordered Plan to follow a low salt diet  Increase activity as tolerated Send EMMI-High Blood Pressure(Hypertension): What you can do     . Client/Caregiver will verbalize understanding of instructions related to self-care and safety   On track    Send Check for safety brochure     . HEMOGLOBIN A1C < 7.0       Last Hemoglobin A1C 7.7% 12/17/18 Plan to check blood sugars either fasting or 1 1/2 hours after eating with goal of 80-130 fasting and 180 or less after meals as directed by your provider Plan to follow a low carbohydrate low salt diet and watch portion sizes    . Maintain timely refills of diabetic medication as prescribed within the year .   On track    Maintaining timely refills of medications per dispense report It is important to get your medications refilled on time    . Obtain annual  Lipid Profile, LDL-C   On track    Last completed 03/11/19 LDL 101 The goal for LDL is less than 70 mg/dL as you are at high risk for complications Try to avoid saturated fats, trans-fats and eat more fiber Plan to take statin as  ordered    . Obtain Annual Eye (retinal)  Exam    On track    Plan to have a dilated eye exam every year    . Obtain Annual Foot Exam   On track    Check your skin and feet every day for cuts, bruises, redness, blisters, or sores. Schedule a foot exam with your health care provider once every year    . Obtain annual screen for micro albuminuria (urine) , nephropathy (kidney problems)   On track    It is important for your doctor to check your urine for protein at least every year    . Obtain Hemoglobin A1C at least 2 times per year   On track    Last completed 12/17/18 It is important to have your Hemoglobin A1C checked every 6 months if you are at goal and every 3 months if you are not at goal    . Visit Primary Care Provider or Endocrinologist  at least 2 times per year    On track    Primary care visit 12/14/18 Please schedule your annual wellness visit        Plan:  . Send unsuccessful outreach letter with a copy of individualized care plan to client . Send individualized care plan to provider . Educational Materials-Check for safety brochure, EMMI-High Blood Pressure(Hypertension): What you can do, COPD: What you can do, Heart Disease in diabetics, Preventing falls  Chronic care management coordinator will attempt outreach in 3 months.  Peter Garter RN, Jackquline Denmark, CDE Chronic Care Management Coordinator [place] [facility] [phone]

## 2019-07-29 ENCOUNTER — Ambulatory Visit: Payer: HMO

## 2019-08-16 ENCOUNTER — Ambulatory Visit: Payer: HMO | Admitting: Interventional Cardiology

## 2019-08-23 ENCOUNTER — Ambulatory Visit: Payer: HMO | Admitting: Interventional Cardiology

## 2019-09-05 DIAGNOSIS — E782 Mixed hyperlipidemia: Secondary | ICD-10-CM | POA: Diagnosis not present

## 2019-09-05 DIAGNOSIS — M8580 Other specified disorders of bone density and structure, unspecified site: Secondary | ICD-10-CM | POA: Diagnosis not present

## 2019-09-05 DIAGNOSIS — F329 Major depressive disorder, single episode, unspecified: Secondary | ICD-10-CM | POA: Diagnosis not present

## 2019-09-05 DIAGNOSIS — Z853 Personal history of malignant neoplasm of breast: Secondary | ICD-10-CM | POA: Diagnosis not present

## 2019-09-05 DIAGNOSIS — C50911 Malignant neoplasm of unspecified site of right female breast: Secondary | ICD-10-CM | POA: Diagnosis not present

## 2019-09-05 DIAGNOSIS — I10 Essential (primary) hypertension: Secondary | ICD-10-CM | POA: Diagnosis not present

## 2019-09-05 DIAGNOSIS — J449 Chronic obstructive pulmonary disease, unspecified: Secondary | ICD-10-CM | POA: Diagnosis not present

## 2019-09-05 DIAGNOSIS — E118 Type 2 diabetes mellitus with unspecified complications: Secondary | ICD-10-CM | POA: Diagnosis not present

## 2019-09-05 DIAGNOSIS — C50919 Malignant neoplasm of unspecified site of unspecified female breast: Secondary | ICD-10-CM | POA: Diagnosis not present

## 2019-09-05 DIAGNOSIS — I25119 Atherosclerotic heart disease of native coronary artery with unspecified angina pectoris: Secondary | ICD-10-CM | POA: Diagnosis not present

## 2019-09-05 DIAGNOSIS — E1151 Type 2 diabetes mellitus with diabetic peripheral angiopathy without gangrene: Secondary | ICD-10-CM | POA: Diagnosis not present

## 2019-09-05 DIAGNOSIS — E7800 Pure hypercholesterolemia, unspecified: Secondary | ICD-10-CM | POA: Diagnosis not present

## 2019-10-04 ENCOUNTER — Other Ambulatory Visit: Payer: Self-pay

## 2019-10-04 NOTE — Patient Outreach (Signed)
  [facility]) Care Management Chronic Special Needs Program  10/04/2019  Name: Lauris Gainous DOB: 28-Jun-1940  MRN: [id number]  Ms. Konni Lipschutz is enrolled in a chronic special needs plan for Diabetes.  Client answered.  States that she and her husband are out and can not talk at this time.  Requests RNCM call back later next week. HIPAA verified Plan for 2nd outreach call in one week Chronic care management coordinator will attempt outreach in one week.   Peter Garter RN, Jackquline Denmark, CDE Chronic Care Management Coordinator [place] [facility] [phone]

## 2019-10-08 NOTE — Progress Notes (Deleted)
Cardiology Office Note:    Date:  10/08/2019   ID:  Katherine, Sullivan 1940-05-21, MRN [id number]  PCP:  Hulan Fess, MD  Cardiologist:  [name], MD   Referring MD: Hulan Fess, MD   No chief complaint on file.   History of Present Illness:    [name] is a 79 y.o. female with a hx of coronary artery disease (high-grade distal RCA collateralized from left), hypertension, obstructive sleep apnea, venous insufficiency with stasis dermatitis, and right lower extremity claudication.  ***  Past Medical History:  Diagnosis Date  . Asthma   . Breast cancer ([place])   . Coronary atherosclerosis   . Depression   . DM (diabetes mellitus) ([place])   . Hyperlipidemia   . Hypertension   . OSA (obstructive sleep apnea) 01/20/2014  . Osteopenia   . Personal history of radiation therapy   . PVD (peripheral vascular disease) ([place])     Past Surgical History:  Procedure Laterality Date  . APPENDECTOMY    . BREAST LUMPECTOMY     1992 on left, 2007 on right  . CHOLECYSTECTOMY    . CYST REMOVAL NECK      Current Medications: No outpatient medications have been marked as taking for the 10/09/19 encounter (Appointment) with Belva Crome, MD.     Allergies:   Penicillins, Saccharin, Amoxicillin, Codeine, Diclofenac sodium, Dye fdc red  [red dye], Erythromycin, Glipizide, Metformin, Metformin and related, Methocarbamol, Niacin, Ramipril, Sulfa antibiotics, Tetanus immune globulin, Voltaren  [diclofenac], and Tetanus toxoid   Social History   Socioeconomic History  . Marital status: Married    Spouse name: Not on file  . Number of children: Not on file  . Years of education: Not on file  . Highest education level: Not on file  Occupational History  . Occupation: retired  Tobacco Use  . Smoking status: Former Smoker    Packs/day: 1.00    Years: 40.00    Pack years: 40.00    Types: Cigarettes    Quit date: 05/10/2003    Years since quitting: 16.4  . Smokeless  tobacco: Never Used  Substance and Sexual Activity  . Alcohol use: No  . Drug use: No  . Sexual activity: Not on file  Other Topics Concern  . Not on file  Social History Narrative  . Not on file   Social Determinants of Health   Financial Resource Strain:   . Difficulty of Paying Living Expenses:   Food Insecurity:   . Worried About Charity fundraiser in the Last Year:   . Arboriculturist in the Last Year:   Transportation Needs:   . Film/video editor (Medical):   Marland Kitchen Lack of Transportation (Non-Medical):   Physical Activity:   . Days of Exercise per Week:   . Minutes of Exercise per Session:   Stress:   . Feeling of Stress :   Social Connections:   . Frequency of Communication with Friends and Family:   . Frequency of Social Gatherings with Friends and Family:   . Attends Religious Services:   . Active Member of Clubs or Organizations:   . Attends Archivist Meetings:   Marland Kitchen Marital Status:      Family History: The patient's family history includes Heart disease in her father and mother.  ROS:   Please see the history of present illness.    *** All other systems reviewed and are negative.  EKGs/Labs/Other Studies Reviewed:  The following studies were reviewed today:  2 D Doppler ECHOCARDIOGRAM 2020: IMPRESSIONS    1. The left ventricle has normal systolic function with an ejection  fraction of 60-65%. The cavity size was normal. Left ventricular diastolic  Doppler parameters are consistent with pseudonormalization. No evidence of  left ventricular regional wall  motion abnormalities.  2. The right ventricle has normal systolic function. The cavity was  normal. There is no increase in right ventricular wall thickness. Right  ventricular systolic pressure could not be assessed.  3. Left atrial size was mildly dilated.  4. Mild thickening of the mitral valve leaflet. There is moderate mitral  annular calcification present.  5. The aortic  valve is tricuspid Mild thickening of the aortic valve Mild  calcification of the aortic valve. Aortic valve regurgitation was not  assessed by color flow Doppler.. Moderate aortic annular calcification  noted.  6. The aortic root and ascending aorta are normal in size and structure.   EKG:  EKG ***  Recent Labs: No results found for requested labs within last 8760 hours.  Recent Lipid Panel No results found for: CHOL, TRIG, HDL, CHOLHDL, VLDL, LDLCALC, LDLDIRECT  Physical Exam:    VS:  There were no vitals taken for this visit.    Wt Readings from Last 3 Encounters:  07/10/18 229 lb (103.9 kg)  11/08/17 224 lb 12.8 oz (102 kg)  10/25/17 225 lb (102.1 kg)     GEN: ***. No acute distress HEENT: Normal NECK: No JVD. LYMPHATICS: No lymphadenopathy CARDIAC: *** RRR without murmur, gallop, or edema. VASCULAR: *** Normal Pulses. No bruits. RESPIRATORY:  Clear to auscultation without rales, wheezing or rhonchi  ABDOMEN: Soft, non-tender, non-distended, No pulsatile mass, MUSCULOSKELETAL: No deformity  SKIN: Warm and dry NEUROLOGIC:  Alert and oriented x 3 PSYCHIATRIC:  Normal affect   ASSESSMENT:    1. Coronary artery disease involving native coronary artery of native heart with angina pectoris ([place])   2. Benign essential HTN   3. Dyspnea on exertion   4. Hyperlipidemia LDL goal <70   5. OSA treated with BiPAP   6. Diabetes mellitus with peripheral circulatory disorder (HCC)   7. Bilateral carotid artery disease, unspecified type ([place])   8. Educated about COVID-19 virus infection    PLAN:    In order of problems listed above:  1. ***   Medication Adjustments/Labs and Tests Ordered: Current medicines are reviewed at length with the patient today.  Concerns regarding medicines are outlined above.  No orders of the defined types were placed in this encounter.  No orders of the defined types were placed in this encounter.   There are no Patient Instructions on file  for this visit.   Signed, [name], MD  10/08/2019 4:46 PM    [place]

## 2019-10-09 ENCOUNTER — Ambulatory Visit: Payer: HMO | Admitting: Interventional Cardiology

## 2019-10-11 ENCOUNTER — Other Ambulatory Visit: Payer: Self-pay

## 2019-10-11 NOTE — Patient Outreach (Signed)
  [place] [facility]) Care Management Chronic Special Needs Program  10/11/2019  Name: [name] DOB: 30-Jun-1940  MRN: [id number]  [name] is enrolled in a chronic special needs plan for Diabetes. Client called with no answer No answer and HIPAA compliant message left. 2nd attempt Plan for 3rd outreach call in 1-2 weeks Chronic care management coordinator will attempt outreach in 1-2 weeks.   Peter Garter RN, Jackquline Denmark, CDE Chronic Care Management Coordinator [place] [facility] [phone]

## 2019-10-25 ENCOUNTER — Other Ambulatory Visit: Payer: Self-pay

## 2019-10-25 NOTE — Patient Outreach (Signed)
[facility]) Care Management Chronic Special Needs Program  10/25/2019  Name: [name] DOB: December 10, 1940  MRN: [id number]  [name] is enrolled in a chronic special needs plan for  Diabetes.  Client called with no answer and HIPAA compliant message left.  3rd attempt A completed health risk assessment has not been received from the client and client has not responded to 3 outreach attempts by [facility] to complete 2021 Health Risk Assessment  The client's individualized care plan was developed based on available data and previous 2020 Health Risk Assessment Goals Addressed            This Visit's Progress   . Client understands the importance of follow-up with providers by attending scheduled visits   On track    Plan to keep scheduled appointments with providers    . Client will report no fall or injuries in the next 6 months.   On track    No recent ED visits for falls Stand up slowly Use cane or walker at all times Use grabber to pick up objects on the floor Keep home well-lit and wear your glasses      . Client will report no worsening of symptoms related to heart disease within the next 6 months    On track    No ED or hospitalizations for heart disease Notify provider for symptoms of chest pain, sweating, nausea/vomiting, irregular heartbeat, palpitations, rapid heart rate, shortness of breath or dizziness or fainting Call 911 for severe symptoms of chest pain or shortness of breath Follow a low salt diet     . COMPLETED: Client will use Assistive Devices as needed and verbalize understanding of device use       Using [employer] approved meter-One Touch per records Goal completed 10/25/19    . Client will verbalize knowledge of chronic lung disease as evidenced by no ED visits or Inpatient stays related to chronic lung disease    On track    No ED visits or hospitalizations for COPD  Plan to use inhalers as directed Plan to  review COPD action plan in the [employer](HT[employer]) calendar    . Client will verbalize knowledge of self management of Hypertension as evidences by BP reading of 140/90 or less; or as defined by provider   On track    Plan to check B/P regularly Take B/P medications as ordered Plan to follow a low salt diet  Increase activity as tolerated     . Client/Caregiver will verbalize understanding of instructions related to self-care and safety   On track    Please contact RNCM [phone] if you are having any difficulty caring for yourself    . HEMOGLOBIN A1C < 7       Last Hemoglobin A1C 7.7% 12/17/18 Plan to check blood sugars either fasting or 1 1/2 hours after eating with goal of 80-130 fasting and 180 or less after meals as directed by your provider Plan to follow a low carbohydrate low salt diet and watch portion sizes Please review diabetes action plan in [place] calendar and call if you have any questions    . COMPLETED: Maintain timely refills of diabetic medication as prescribed within the year .   On track    Maintaining timely refills of medications per dispense report It is important to get your medications refilled on time Goal completed 10/25/19    . Obtain annual  Lipid Profile, LDL-C   Not  on track    Last completed 03/11/19 LDL 101 The goal for LDL is less than 70 mg/dL as you are at high risk for complications Try to avoid saturated fats, trans-fats and eat more fiber Plan to take statin as ordered Sent EMMI-Low cholesterol, saturated fat, trans fat diet    . Obtain Annual Eye (retinal)  Exam    Not on track    Your last documented eye exam was on 03/19/2018 Diabetes can affect your vision. Plan to have a dilated eye exam every year    . Obtain Annual Foot Exam   Not on track    Check your skin and feet every day for cuts, bruises, redness, blisters, or sores. Schedule a foot exam with your health care provider once every year    . Obtain annual  screen for micro albuminuria (urine) , nephropathy (kidney problems)   Not on track    It is important for your doctor to check your urine for protein at least every year    . Obtain Hemoglobin A1C at least 2 times per year   Not on track    Last completed 12/17/18 It is important to have your Hemoglobin A1C checked every 6 months if you are at goal and every 3 months if you are not at goal    . Visit Primary Care Provider or Endocrinologist at least 2 times per year    Not on track    Primary care visit 12/14/18 Please schedule your annual wellness visit        Plan:  . Send unsuccessful outreach letter with a copy of individualized care plan to client . Send individualized care plan to provider . Educational Materials-EMMI-Low cholesterol, saturated fat and trans fat diet  Chronic care management coordinator will attempt outreach in 6 months per tier level.  Peter Garter RN, Jackquline Denmark, CDE Chronic Care Management Coordinator [facility] Care Management [phone]

## 2019-12-04 DIAGNOSIS — I10 Essential (primary) hypertension: Secondary | ICD-10-CM | POA: Diagnosis not present

## 2019-12-04 DIAGNOSIS — C50919 Malignant neoplasm of unspecified site of unspecified female breast: Secondary | ICD-10-CM | POA: Diagnosis not present

## 2019-12-04 DIAGNOSIS — F329 Major depressive disorder, single episode, unspecified: Secondary | ICD-10-CM | POA: Diagnosis not present

## 2019-12-04 DIAGNOSIS — I25119 Atherosclerotic heart disease of native coronary artery with unspecified angina pectoris: Secondary | ICD-10-CM | POA: Diagnosis not present

## 2019-12-04 DIAGNOSIS — E1151 Type 2 diabetes mellitus with diabetic peripheral angiopathy without gangrene: Secondary | ICD-10-CM | POA: Diagnosis not present

## 2019-12-04 DIAGNOSIS — E7800 Pure hypercholesterolemia, unspecified: Secondary | ICD-10-CM | POA: Diagnosis not present

## 2019-12-04 DIAGNOSIS — J449 Chronic obstructive pulmonary disease, unspecified: Secondary | ICD-10-CM | POA: Diagnosis not present

## 2019-12-04 DIAGNOSIS — E118 Type 2 diabetes mellitus with unspecified complications: Secondary | ICD-10-CM | POA: Diagnosis not present

## 2019-12-04 DIAGNOSIS — M8580 Other specified disorders of bone density and structure, unspecified site: Secondary | ICD-10-CM | POA: Diagnosis not present

## 2019-12-04 DIAGNOSIS — Z853 Personal history of malignant neoplasm of breast: Secondary | ICD-10-CM | POA: Diagnosis not present

## 2019-12-04 DIAGNOSIS — E782 Mixed hyperlipidemia: Secondary | ICD-10-CM | POA: Diagnosis not present

## 2019-12-04 DIAGNOSIS — C50911 Malignant neoplasm of unspecified site of right female breast: Secondary | ICD-10-CM | POA: Diagnosis not present

## 2019-12-13 DIAGNOSIS — R350 Frequency of micturition: Secondary | ICD-10-CM | POA: Diagnosis not present

## 2019-12-13 DIAGNOSIS — J449 Chronic obstructive pulmonary disease, unspecified: Secondary | ICD-10-CM | POA: Diagnosis not present

## 2019-12-13 DIAGNOSIS — R413 Other amnesia: Secondary | ICD-10-CM | POA: Diagnosis not present

## 2019-12-13 DIAGNOSIS — I10 Essential (primary) hypertension: Secondary | ICD-10-CM | POA: Diagnosis not present

## 2019-12-13 DIAGNOSIS — E118 Type 2 diabetes mellitus with unspecified complications: Secondary | ICD-10-CM | POA: Diagnosis not present

## 2019-12-13 DIAGNOSIS — I779 Disorder of arteries and arterioles, unspecified: Secondary | ICD-10-CM | POA: Diagnosis not present

## 2019-12-13 DIAGNOSIS — G4730 Sleep apnea, unspecified: Secondary | ICD-10-CM | POA: Diagnosis not present

## 2019-12-13 DIAGNOSIS — M8580 Other specified disorders of bone density and structure, unspecified site: Secondary | ICD-10-CM | POA: Diagnosis not present

## 2019-12-13 DIAGNOSIS — F338 Other recurrent depressive disorders: Secondary | ICD-10-CM | POA: Diagnosis not present

## 2019-12-13 DIAGNOSIS — I25119 Atherosclerotic heart disease of native coronary artery with unspecified angina pectoris: Secondary | ICD-10-CM | POA: Diagnosis not present

## 2019-12-13 DIAGNOSIS — E7800 Pure hypercholesterolemia, unspecified: Secondary | ICD-10-CM | POA: Diagnosis not present

## 2019-12-13 DIAGNOSIS — Z853 Personal history of malignant neoplasm of breast: Secondary | ICD-10-CM | POA: Diagnosis not present

## 2019-12-16 ENCOUNTER — Other Ambulatory Visit (HOSPITAL_COMMUNITY): Payer: Self-pay | Admitting: Family Medicine

## 2019-12-16 DIAGNOSIS — I779 Disorder of arteries and arterioles, unspecified: Secondary | ICD-10-CM

## 2019-12-19 ENCOUNTER — Other Ambulatory Visit: Payer: Self-pay | Admitting: Family Medicine

## 2019-12-19 DIAGNOSIS — M8580 Other specified disorders of bone density and structure, unspecified site: Secondary | ICD-10-CM

## 2019-12-20 ENCOUNTER — Other Ambulatory Visit: Payer: Self-pay | Admitting: Family Medicine

## 2019-12-20 ENCOUNTER — Ambulatory Visit (HOSPITAL_COMMUNITY)
Admission: RE | Admit: 2019-12-20 | Discharge: 2019-12-20 | Disposition: A | Discharge status: Home / Self Care | Payer: HMO | Source: Ambulatory Visit | Attending: Family Medicine | Admitting: Family Medicine

## 2019-12-20 ENCOUNTER — Other Ambulatory Visit: Payer: Self-pay

## 2019-12-20 DIAGNOSIS — I779 Disorder of arteries and arterioles, unspecified: Secondary | ICD-10-CM

## 2019-12-20 DIAGNOSIS — Z1231 Encounter for screening mammogram for malignant neoplasm of breast: Secondary | ICD-10-CM

## 2019-12-20 NOTE — Progress Notes (Signed)
Carotid artery duplex completed. Refer to "CV Proc" under chart review to view preliminary results.  12/20/2019 3:46 PM Kelby Aline., MHA, RVT, RDCS, RDMS

## 2019-12-24 ENCOUNTER — Encounter (HOSPITAL_COMMUNITY): Payer: HMO

## 2020-01-07 DIAGNOSIS — I10 Essential (primary) hypertension: Secondary | ICD-10-CM | POA: Diagnosis not present

## 2020-01-07 DIAGNOSIS — F329 Major depressive disorder, single episode, unspecified: Secondary | ICD-10-CM | POA: Diagnosis not present

## 2020-01-07 DIAGNOSIS — E1151 Type 2 diabetes mellitus with diabetic peripheral angiopathy without gangrene: Secondary | ICD-10-CM | POA: Diagnosis not present

## 2020-01-07 DIAGNOSIS — J449 Chronic obstructive pulmonary disease, unspecified: Secondary | ICD-10-CM | POA: Diagnosis not present

## 2020-01-07 DIAGNOSIS — C50911 Malignant neoplasm of unspecified site of right female breast: Secondary | ICD-10-CM | POA: Diagnosis not present

## 2020-01-07 DIAGNOSIS — E118 Type 2 diabetes mellitus with unspecified complications: Secondary | ICD-10-CM | POA: Diagnosis not present

## 2020-01-07 DIAGNOSIS — Z853 Personal history of malignant neoplasm of breast: Secondary | ICD-10-CM | POA: Diagnosis not present

## 2020-01-07 DIAGNOSIS — C50919 Malignant neoplasm of unspecified site of unspecified female breast: Secondary | ICD-10-CM | POA: Diagnosis not present

## 2020-01-07 DIAGNOSIS — M8580 Other specified disorders of bone density and structure, unspecified site: Secondary | ICD-10-CM | POA: Diagnosis not present

## 2020-01-07 DIAGNOSIS — E782 Mixed hyperlipidemia: Secondary | ICD-10-CM | POA: Diagnosis not present

## 2020-01-07 DIAGNOSIS — E7800 Pure hypercholesterolemia, unspecified: Secondary | ICD-10-CM | POA: Diagnosis not present

## 2020-01-07 DIAGNOSIS — I25119 Atherosclerotic heart disease of native coronary artery with unspecified angina pectoris: Secondary | ICD-10-CM | POA: Diagnosis not present

## 2020-01-23 DIAGNOSIS — Z794 Long term (current) use of insulin: Secondary | ICD-10-CM | POA: Diagnosis not present

## 2020-01-23 DIAGNOSIS — R413 Other amnesia: Secondary | ICD-10-CM | POA: Diagnosis not present

## 2020-01-23 DIAGNOSIS — E7800 Pure hypercholesterolemia, unspecified: Secondary | ICD-10-CM | POA: Diagnosis not present

## 2020-01-23 DIAGNOSIS — E118 Type 2 diabetes mellitus with unspecified complications: Secondary | ICD-10-CM | POA: Diagnosis not present

## 2020-01-30 DIAGNOSIS — C50919 Malignant neoplasm of unspecified site of unspecified female breast: Secondary | ICD-10-CM | POA: Diagnosis not present

## 2020-01-30 DIAGNOSIS — J449 Chronic obstructive pulmonary disease, unspecified: Secondary | ICD-10-CM | POA: Diagnosis not present

## 2020-01-30 DIAGNOSIS — I10 Essential (primary) hypertension: Secondary | ICD-10-CM | POA: Diagnosis not present

## 2020-01-30 DIAGNOSIS — E782 Mixed hyperlipidemia: Secondary | ICD-10-CM | POA: Diagnosis not present

## 2020-01-30 DIAGNOSIS — F329 Major depressive disorder, single episode, unspecified: Secondary | ICD-10-CM | POA: Diagnosis not present

## 2020-01-30 DIAGNOSIS — E118 Type 2 diabetes mellitus with unspecified complications: Secondary | ICD-10-CM | POA: Diagnosis not present

## 2020-01-30 DIAGNOSIS — M8580 Other specified disorders of bone density and structure, unspecified site: Secondary | ICD-10-CM | POA: Diagnosis not present

## 2020-01-30 DIAGNOSIS — E1151 Type 2 diabetes mellitus with diabetic peripheral angiopathy without gangrene: Secondary | ICD-10-CM | POA: Diagnosis not present

## 2020-01-30 DIAGNOSIS — E7800 Pure hypercholesterolemia, unspecified: Secondary | ICD-10-CM | POA: Diagnosis not present

## 2020-01-30 DIAGNOSIS — Z853 Personal history of malignant neoplasm of breast: Secondary | ICD-10-CM | POA: Diagnosis not present

## 2020-01-30 DIAGNOSIS — C50911 Malignant neoplasm of unspecified site of right female breast: Secondary | ICD-10-CM | POA: Diagnosis not present

## 2020-01-30 DIAGNOSIS — I25119 Atherosclerotic heart disease of native coronary artery with unspecified angina pectoris: Secondary | ICD-10-CM | POA: Diagnosis not present

## 2020-02-02 ENCOUNTER — Other Ambulatory Visit: Payer: Self-pay

## 2020-02-02 ENCOUNTER — Emergency Department (HOSPITAL_COMMUNITY)
Admission: EM | Admit: 2020-02-02 | Discharge: 2020-02-02 | Disposition: A | Discharge status: Home / Self Care | Payer: HMO | Attending: Emergency Medicine | Admitting: Emergency Medicine

## 2020-02-02 ENCOUNTER — Encounter (HOSPITAL_COMMUNITY): Payer: Self-pay | Admitting: Emergency Medicine

## 2020-02-02 ENCOUNTER — Emergency Department (HOSPITAL_COMMUNITY): Payer: HMO

## 2020-02-02 DIAGNOSIS — Z7982 Long term (current) use of aspirin: Secondary | ICD-10-CM | POA: Insufficient documentation

## 2020-02-02 DIAGNOSIS — R519 Headache, unspecified: Secondary | ICD-10-CM | POA: Diagnosis not present

## 2020-02-02 DIAGNOSIS — S161XXA Strain of muscle, fascia and tendon at neck level, initial encounter: Secondary | ICD-10-CM | POA: Diagnosis not present

## 2020-02-02 DIAGNOSIS — Y9389 Activity, other specified: Secondary | ICD-10-CM | POA: Diagnosis not present

## 2020-02-02 DIAGNOSIS — Z794 Long term (current) use of insulin: Secondary | ICD-10-CM | POA: Diagnosis not present

## 2020-02-02 DIAGNOSIS — W010XXA Fall on same level from slipping, tripping and stumbling without subsequent striking against object, initial encounter: Secondary | ICD-10-CM | POA: Insufficient documentation

## 2020-02-02 DIAGNOSIS — Y9289 Other specified places as the place of occurrence of the external cause: Secondary | ICD-10-CM | POA: Diagnosis not present

## 2020-02-02 DIAGNOSIS — S0990XA Unspecified injury of head, initial encounter: Secondary | ICD-10-CM

## 2020-02-02 DIAGNOSIS — I2510 Atherosclerotic heart disease of native coronary artery without angina pectoris: Secondary | ICD-10-CM | POA: Diagnosis not present

## 2020-02-02 DIAGNOSIS — M542 Cervicalgia: Secondary | ICD-10-CM | POA: Diagnosis not present

## 2020-02-02 DIAGNOSIS — R42 Dizziness and giddiness: Secondary | ICD-10-CM | POA: Insufficient documentation

## 2020-02-02 DIAGNOSIS — E119 Type 2 diabetes mellitus without complications: Secondary | ICD-10-CM | POA: Insufficient documentation

## 2020-02-02 DIAGNOSIS — R531 Weakness: Secondary | ICD-10-CM | POA: Diagnosis not present

## 2020-02-02 DIAGNOSIS — R52 Pain, unspecified: Secondary | ICD-10-CM | POA: Diagnosis not present

## 2020-02-02 DIAGNOSIS — R112 Nausea with vomiting, unspecified: Secondary | ICD-10-CM | POA: Insufficient documentation

## 2020-02-02 DIAGNOSIS — I491 Atrial premature depolarization: Secondary | ICD-10-CM | POA: Diagnosis not present

## 2020-02-02 DIAGNOSIS — J45901 Unspecified asthma with (acute) exacerbation: Secondary | ICD-10-CM | POA: Diagnosis not present

## 2020-02-02 DIAGNOSIS — R739 Hyperglycemia, unspecified: Secondary | ICD-10-CM

## 2020-02-02 DIAGNOSIS — Z87891 Personal history of nicotine dependence: Secondary | ICD-10-CM | POA: Insufficient documentation

## 2020-02-02 DIAGNOSIS — Z853 Personal history of malignant neoplasm of breast: Secondary | ICD-10-CM | POA: Diagnosis not present

## 2020-02-02 DIAGNOSIS — E1165 Type 2 diabetes mellitus with hyperglycemia: Secondary | ICD-10-CM | POA: Diagnosis not present

## 2020-02-02 DIAGNOSIS — W19XXXA Unspecified fall, initial encounter: Secondary | ICD-10-CM

## 2020-02-02 DIAGNOSIS — I10 Essential (primary) hypertension: Secondary | ICD-10-CM | POA: Diagnosis not present

## 2020-02-02 DIAGNOSIS — R0689 Other abnormalities of breathing: Secondary | ICD-10-CM | POA: Diagnosis not present

## 2020-02-02 LAB — DIFFERENTIAL
Abs Immature Granulocytes: 0.05 10*3/uL (ref 0.00–0.07)
Basophils Absolute: 0.1 10*3/uL (ref 0.0–0.1)
Basophils Relative: 0 %
Eosinophils Absolute: 0.2 10*3/uL (ref 0.0–0.5)
Eosinophils Relative: 2 %
Immature Granulocytes: 0 %
Lymphocytes Relative: 24 %
Lymphs Abs: 3 10*3/uL (ref 0.7–4.0)
Monocytes Absolute: 0.8 10*3/uL (ref 0.1–1.0)
Monocytes Relative: 7 %
Neutro Abs: 8 10*3/uL — ABNORMAL HIGH (ref 1.7–7.7)
Neutrophils Relative %: 67 %

## 2020-02-02 LAB — CBC
HCT: 40.6 % (ref 36.0–46.0)
Hemoglobin: 13 g/dL (ref 12.0–15.0)
MCH: 28.8 pg (ref 26.0–34.0)
MCHC: 32 g/dL (ref 30.0–36.0)
MCV: 90 fL (ref 80.0–100.0)
Platelets: 352 10*3/uL (ref 150–400)
RBC: 4.51 MIL/uL (ref 3.87–5.11)
RDW: 12.9 % (ref 11.5–15.5)
WBC: 12.1 10*3/uL — ABNORMAL HIGH (ref 4.0–10.5)
nRBC: 0 % (ref 0.0–0.2)

## 2020-02-02 LAB — COMPREHENSIVE METABOLIC PANEL
ALT: 20 U/L (ref 0–44)
AST: 25 U/L (ref 15–41)
Albumin: 3.3 g/dL — ABNORMAL LOW (ref 3.5–5.0)
Alkaline Phosphatase: 56 U/L (ref 38–126)
Anion gap: 14 (ref 5–15)
BUN: 20 mg/dL (ref 8–23)
CO2: 22 mmol/L (ref 22–32)
Calcium: 10.1 mg/dL (ref 8.9–10.3)
Chloride: 98 mmol/L (ref 98–111)
Creatinine, Ser: 1.02 mg/dL — ABNORMAL HIGH (ref 0.44–1.00)
GFR calc Af Amer: 60 mL/min — ABNORMAL LOW (ref >60)
GFR calc non Af Amer: 52 mL/min — ABNORMAL LOW (ref >60)
Glucose, Bld: 372 mg/dL — ABNORMAL HIGH (ref 70–99)
Potassium: 3.5 mmol/L (ref 3.5–5.1)
Sodium: 134 mmol/L — ABNORMAL LOW (ref 135–145)
Total Bilirubin: 0.5 mg/dL (ref 0.3–1.2)
Total Protein: 6.4 g/dL — ABNORMAL LOW (ref 6.5–8.1)

## 2020-02-02 LAB — CBG MONITORING, ED
Glucose-Capillary: 411 mg/dL — ABNORMAL HIGH (ref 70–99)
Glucose-Capillary: 299 mg/dL — ABNORMAL HIGH (ref 70–99)

## 2020-02-02 MED ORDER — SODIUM CHLORIDE 0.9% FLUSH: 3.0000 mL | Freq: Once | INTRAVENOUS | Status: DC

## 2020-02-02 MED ORDER — SODIUM CHLORIDE 0.9 % IV BOLUS
500.0000 mL | Freq: Once | INTRAVENOUS | Status: AC
  Administered 2020-02-02: 500 mL via INTRAVENOUS

## 2020-02-02 MED ORDER — INSULIN ASPART 100 UNIT/ML ~~LOC~~ SOLN: 4.0000 [IU] | Freq: Once | SUBCUTANEOUS | Status: DC

## 2020-02-02 MED ORDER — INSULIN ASPART 100 UNIT/ML ~~LOC~~ SOLN
6.0000 [IU] | Freq: Once | SUBCUTANEOUS | Status: AC
  Administered 2020-02-02: 6 [IU] via INTRAVENOUS

## 2020-02-02 NOTE — ED Provider Notes (Signed)
[facility] EMERGENCY DEPARTMENT Provider Note   CSN: [id number] Arrival date & time: 02/02/20  7829     History Chief Complaint  Patient presents with  . Fall  . Dizziness    [name] is a 79 y.o. female.  Patient is an 79 year old female with past medical history of COPD, diabetes, hypertension, hyperlipidemia.  She presents today for evaluation of nausea, vomiting, weakness, dizziness, and fall.  Patient states that she woke this morning feeling dizzy and nauseated.  She went to get out of bed, then slipped and fell to the floor.  She reports vomiting several times at home and with EMS.  She was given Zofran which seemed to help.  She describes striking her head, but denies any headache.  She is uncertain as to whether or not she lost consciousness.  She was describing some neck discomfort.  Otherwise patient denies chest pain, abdominal pain, difficulty breathing, or diarrhea.  The history is provided by the patient.       Past Medical History:  Diagnosis Date  . Asthma   . Breast cancer ([place])   . Coronary atherosclerosis   . Depression   . DM (diabetes mellitus) ([place])   . Hyperlipidemia   . Hypertension   . OSA (obstructive sleep apnea) 01/20/2014  . Osteopenia   . Personal history of radiation therapy   . PVD (peripheral vascular disease) [facility])     Patient Active Problem List   Diagnosis Date Noted  . COPD (chronic obstructive pulmonary disease) ([place]) 07/08/2016  . Acute bronchitis 04/26/2016  . Asthma with acute exacerbation 04/26/2016  . Obesity (BMI 30-39.9) 12/20/2014  . Angina pectoris ([place]) 11/11/2014  . Breast cancer, female ([place]) 11/11/2014  . Benign neoplasm of colon 11/11/2014  . CAFL (chronic airflow limitation) ([place]) 11/11/2014  . Clinical depression 11/11/2014  . Major depressive episode 11/11/2014  . Diabetes mellitus with peripheral circulatory disorder ([place]) 11/11/2014  . Diabetes mellitus, type 2 ([place]) 11/11/2014    . Diabetes mellitus type 2, uncontrolled ([place]) 11/11/2014  . Benign essential HTN 11/11/2014  . Combined fat and carbohydrate induced hyperlipemia 11/11/2014  . Adiposity 11/11/2014  . Bone/cartilage disorder 11/11/2014  . Blood clot associated with vein wall inflammation 11/11/2014  . Hypercholesterolemia without hypertriglyceridemia 11/11/2014  . Peripheral blood vessel disorder ([place]) 11/11/2014  . Left foot pain 11/11/2014  . Dysesthesia 11/11/2014  . Carotid bruit 11/11/2014  . COPD GOLD 0 vs asthma 08/26/2014  . Venous insufficiency of right lower extremity 08/26/2014  . OSA (obstructive sleep apnea) 01/20/2014  . Type II diabetes mellitus with complication ([place]) 56/21/3086  . Abnormal echocardiogram 12/11/2013  . Dermatitis, eczematoid 12/11/2013  . Coronary artery disease involving native coronary artery of native heart with angina pectoris ([place]) 06/10/2013  . Nocturnal hypoxemia 10/24/2012  . Hyperlipidemia 05/22/2008  . Dyspnea 05/22/2008  . NEOPLASM, MALIGNANT, BREAST, HX OF 05/22/2008    Past Surgical History:  Procedure Laterality Date  . APPENDECTOMY    . BREAST LUMPECTOMY     1992 on left, 2007 on right  . CHOLECYSTECTOMY    . CYST REMOVAL NECK       OB History   No obstetric history on file.     Family History  Problem Relation Age of Onset  . Heart disease Mother   . Heart disease Father     Social History   Tobacco Use  . Smoking status: Former Smoker    Packs/day: 1.00    Years: 40.00  Pack years: 40.00    Types: Cigarettes    Quit date: 05/10/2003    Years since quitting: 16.7  . Smokeless tobacco: Never Used  Vaping Use  . Vaping Use: Never used  Substance Use Topics  . Alcohol use: No  . Drug use: No    Home Medications Prior to Admission medications   Medication Sig Start Date End Date Taking? Authorizing Provider  albuterol (PROAIR HFA) 108 (90 Base) MCG/ACT inhaler Inhale 1-2 puffs into the lungs every 6 (six) hours as needed  for wheezing or shortness of breath. 06/27/19   Parrett, Fonnie Mu, NP  aspirin 81 MG tablet Take 81 mg by mouth daily.    [provider]  azelastine (ASTELIN) 137 MCG/SPRAY nasal spray Place 1 spray into both nostrils 2 (two) times daily as needed.  04/09/13   [provider]  cholecalciferol (VITAMIN D) 1000 UNITS tablet Take 2,000 Units by mouth daily.    [provider]  insulin NPH Human (NOVOLIN N) 100 UNIT/ML injection Inject into the skin. Sliding scale with meals    [provider]  insulin regular (NOVOLIN R,HUMULIN R) 100 units/mL injection Inject into the skin 3 (three) times daily before meals. Sliding scale    [provider]  irbesartan (AVAPRO) 75 MG tablet Take 1 tablet (75 mg total) by mouth daily. 03/28/16   Tanda Rockers, MD  isosorbide mononitrate (IMDUR) 60 MG 24 hr tablet Take 1 tablet by mouth once daily 12/24/18   Belva Crome, MD  Naproxen Sodium (ALEVE PO) Take 1-2 tablets by mouth every 12 (twelve) hours as needed (for leg pain).    [provider]  rosuvastatin (CRESTOR) 40 MG tablet Take 40 mg by mouth daily.    [provider]  SEEBRI NEOHALER 15.6 MCG CAPS INHALE 1 PUFF INTO LUNGS TWICE DAILY 05/23/18   Chesley Mires, MD  sertraline (ZOLOFT) 100 MG tablet Take 100 mg by mouth daily.    [provider]    Allergies    Penicillins, Saccharin, Amoxicillin, Codeine, Diclofenac sodium, Dye fdc red  [red dye], Erythromycin, Glipizide, Metformin, Metformin and related, Methocarbamol, Niacin, Ramipril, Sulfa antibiotics, Tetanus immune globulin, Voltaren  [diclofenac], and Tetanus toxoid  Review of Systems   Review of Systems  All other systems reviewed and are negative.   Physical Exam Updated Vital Signs BP (!) 120/40   Pulse 67   Temp 97.8 F (36.6 C) (Oral)   Resp 16   SpO2 95%   Physical Exam Vitals and nursing note reviewed.  Constitutional:      General: She is not in acute  distress.    Appearance: She is well-developed. She is not diaphoretic.  HENT:     Head: Normocephalic and atraumatic.  Eyes:     Extraocular Movements: Extraocular movements intact.     Pupils: Pupils are equal, round, and reactive to light.  Cardiovascular:     Rate and Rhythm: Normal rate and regular rhythm.     Heart sounds: No murmur heard.  No friction rub. No gallop.   Pulmonary:     Effort: Pulmonary effort is normal. No respiratory distress.     Breath sounds: Normal breath sounds. No wheezing.  Abdominal:     General: Bowel sounds are normal. There is no distension.     Palpations: Abdomen is soft.     Tenderness: There is no abdominal tenderness.  Musculoskeletal:        General: No swelling. Normal range of  motion.     Cervical back: Normal range of motion and neck supple.     Right lower leg: No edema.     Left lower leg: No edema.  Skin:    General: Skin is warm and dry.  Neurological:     General: No focal deficit present.     Mental Status: She is alert and oriented to person, place, and time.     Cranial Nerves: No cranial nerve deficit.     Sensory: No sensory deficit.     Motor: No weakness.     Coordination: Coordination normal.     ED Results / Procedures / Treatments   Labs (all labs ordered are listed, but only abnormal results are displayed) Labs Reviewed  CBC - Abnormal; Notable for the following components:      Result Value   WBC 12.1 (*)    All other components within normal limits  DIFFERENTIAL - Abnormal; Notable for the following components:   Neutro Abs 8.0 (*)    All other components within normal limits  COMPREHENSIVE METABOLIC PANEL - Abnormal; Notable for the following components:   Sodium 134 (*)    Glucose, Bld 372 (*)    Creatinine, Ser 1.02 (*)    Total Protein 6.4 (*)    Albumin 3.3 (*)    GFR calc non Af Amer 52 (*)    GFR calc Af Amer 60 (*)    All other components within normal limits  PROTIME-INR  APTT    EKG EKG  Interpretation  Date/Time:  Sunday February 02 2020 09:37:09 EDT Ventricular Rate:  74 PR Interval:  208 QRS Duration: 84 QT Interval:  426 QTC Calculation: 472 R Axis:   19 Text Interpretation: Sinus rhythm with occasional Premature ventricular complexes and Premature atrial complexes Nonspecific T wave abnormality Prolonged QT Abnormal ECG Confirmed by Veryl Speak [id number]) on 02/02/2020 11:23:39 AM   Radiology CT HEAD WO CONTRAST  Result Date: 02/02/2020 CLINICAL DATA:  79 year old female with headache and neck pain following fall. Initial encounter. EXAM: CT HEAD WITHOUT CONTRAST CT CERVICAL SPINE WITHOUT CONTRAST TECHNIQUE: Multidetector CT imaging of the head and cervical spine was performed following the standard protocol without intravenous contrast. Multiplanar CT image reconstructions of the cervical spine were also generated. COMPARISON:  12/10/2007 head CT FINDINGS: CT HEAD FINDINGS Brain: No evidence of acute infarction, hemorrhage, hydrocephalus, extra-axial collection or mass lesion/mass effect. Atrophy and mild chronic small-vessel white matter ischemic changes are again noted. Vascular: Carotid atherosclerotic calcifications are noted. Skull: Normal. Negative for fracture or focal lesion. Sinuses/Orbits: No acute finding. Other: None. CT CERVICAL SPINE FINDINGS Alignment: Normal. Skull base and vertebrae: No acute fracture. No primary bone lesion or focal pathologic process. Soft tissues and spinal canal: No prevertebral fluid or swelling. No visible canal hematoma. Disc levels: Moderate to severe degenerative disc disease/spondylosis noted at C5-6 and C6-7 with central spinal and bony foraminal narrowing noted. Mild facet arthropathy in the UPPER cervical spine noted. Upper chest: A 1.2 cm LEFT thyroid nodule is present, not clinically significant. Other: None IMPRESSION: 1. No evidence of acute intracranial abnormality. Atrophy and chronic small-vessel white matter ischemic changes.  2. No static evidence of acute injury to the cervical spine. Degenerative changes as described. Electronically Signed   By: Margarette Canada M.D.   On: 02/02/2020 10:48   CT Cervical Spine Wo Contrast  Result Date: 02/02/2020 CLINICAL DATA:  79 year old female with headache and neck pain following fall. Initial encounter. EXAM: CT HEAD  WITHOUT CONTRAST CT CERVICAL SPINE WITHOUT CONTRAST TECHNIQUE: Multidetector CT imaging of the head and cervical spine was performed following the standard protocol without intravenous contrast. Multiplanar CT image reconstructions of the cervical spine were also generated. COMPARISON:  12/10/2007 head CT FINDINGS: CT HEAD FINDINGS Brain: No evidence of acute infarction, hemorrhage, hydrocephalus, extra-axial collection or mass lesion/mass effect. Atrophy and mild chronic small-vessel white matter ischemic changes are again noted. Vascular: Carotid atherosclerotic calcifications are noted. Skull: Normal. Negative for fracture or focal lesion. Sinuses/Orbits: No acute finding. Other: None. CT CERVICAL SPINE FINDINGS Alignment: Normal. Skull base and vertebrae: No acute fracture. No primary bone lesion or focal pathologic process. Soft tissues and spinal canal: No prevertebral fluid or swelling. No visible canal hematoma. Disc levels: Moderate to severe degenerative disc disease/spondylosis noted at C5-6 and C6-7 with central spinal and bony foraminal narrowing noted. Mild facet arthropathy in the UPPER cervical spine noted. Upper chest: A 1.2 cm LEFT thyroid nodule is present, not clinically significant. Other: None IMPRESSION: 1. No evidence of acute intracranial abnormality. Atrophy and chronic small-vessel white matter ischemic changes. 2. No static evidence of acute injury to the cervical spine. Degenerative changes as described. Electronically Signed   By: Margarette Canada M.D.   On: 02/02/2020 10:48    Procedures Procedures (including critical care time)  Medications Ordered in  ED Medications  sodium chloride flush (NS) 0.9 % injection 3 mL (has no administration in time range)    ED Course  I have reviewed the triage vital signs and the nursing notes.  Pertinent labs & imaging results that were available during my care of the patient were reviewed by me and considered in my medical decision making (see chart for details).    MDM Rules/Calculators/A&P  Patient is an 79 year old female presenting after a dizzy episode that occurred this morning.  Patient attempting to get out of bed when she became dizzy and fell.  She had vomiting which has since improved after receiving Zofran.  Patient arrives here with stable vital signs and nonfocal neurologic exam.  Work-up initiated including CT scan of the head and cervical spine as well as laboratory studies.  All studies are essentially unremarkable with the exception of her blood sugar of 411.  This was addressed with IV fluids and IV insulin.  Her most recent fingerstick is now up to 99.  Patient having no further dizziness here in the ER and I believe is appropriate for discharge to home.  Final Clinical Impression(s) / ED Diagnoses Final diagnoses:  None    Rx / DC Orders ED Discharge Orders    None       Veryl Speak, MD 02/02/20 1504

## 2020-02-02 NOTE — ED Notes (Signed)
Pt given warm blankets.

## 2020-02-02 NOTE — ED Notes (Signed)
Reviewed discharge instructions with patient. Follow-up care reviewed. Patient verbalized understanding. Patient A&Ox4, VSS upon discharge.  

## 2020-02-02 NOTE — ED Triage Notes (Addendum)
Pt rolled out of bed this morning.  Called EMS and they had to break into her house.  Initial BP 94/50.  Reports neck pain, vomiting, and dizziness since fall.  C-collar changed on arrival due to vomiting.  18 g L upper arm, NS 300cc, Zofran 4mg  IV- PTA by EMS.  PVC/PAC. No blood thinners.

## 2020-02-02 NOTE — ED Notes (Signed)
Pt's CBG result was 299. Informed Raquel Sarna - RN.

## 2020-02-02 NOTE — ED Notes (Signed)
Dr. Sabra Heck notified of pt.  CT C-spine ordered.

## 2020-02-02 NOTE — Discharge Instructions (Addendum)
Continue medications as previously prescribed.  Follow-up with primary doctor in the next week, and return to the ER if symptoms significantly worsen or change. 

## 2020-02-12 ENCOUNTER — Ambulatory Visit: Payer: HMO | Admitting: Interventional Cardiology

## 2020-03-13 DIAGNOSIS — E1165 Type 2 diabetes mellitus with hyperglycemia: Secondary | ICD-10-CM | POA: Diagnosis not present

## 2020-03-25 ENCOUNTER — Other Ambulatory Visit: Payer: HMO

## 2020-03-25 ENCOUNTER — Ambulatory Visit: Payer: HMO

## 2020-04-09 ENCOUNTER — Other Ambulatory Visit: Payer: Self-pay

## 2020-04-09 NOTE — Patient Outreach (Signed)
  [facility]) Care Management Chronic Special Needs Program  04/09/2020  Name: [name] DOB: 1940-08-02  MRN: [id number]  [name] is enrolled in a chronic special needs plan for Diabetes. Client called with no answer No answer and HIPAA compliant message left. 1st attempt Plan for 2nd outreach call in one week Chronic care management coordinator will attempt outreach in one week.   Peter Garter RN, Jackquline Denmark, CDE Chronic Care Management Coordinator [facility] [phone]

## 2020-04-14 ENCOUNTER — Other Ambulatory Visit: Payer: Self-pay

## 2020-04-14 NOTE — Patient Outreach (Signed)
  [facility]) Care Management Chronic Special Needs Program  04/14/2020  Name: [name] DOB: 1940-12-28  MRN: [id number]  [name] is enrolled in a chronic special needs plan for Diabetes. Client called with no answer No answer and HIPAA compliant message left. 2nd attempt Plan for 3rd outreach call in one week Chronic care management coordinator will attempt outreach in one week.   Peter Garter RN, Jackquline Denmark, CDE Chronic Care Management Coordinator [facility] [phone]

## 2020-04-15 ENCOUNTER — Ambulatory Visit: Payer: Self-pay

## 2020-04-20 ENCOUNTER — Other Ambulatory Visit: Payer: Self-pay

## 2020-04-20 NOTE — Patient Outreach (Signed)
[facility]) Care Management Chronic Special Needs Program  04/20/2020  Name: [name] DOB: 06-01-1940  MRN: [id number]  [name] is enrolled in a chronic special needs plan for  Diabetes.  Client called with no answer and HIPAA compliant message left.  3rd attempt A completed 2021 health risk assessment has not been received from the client and client has not responded to 3 outreach attempts by [facility] to complete 2021 Health Risk Assessment  The client's individualized care plan was developed based on available data and previous 2020 Health Risk Assessment Goals Addressed            This Visit's Progress   . Client understands the importance of follow-up with providers by attending scheduled visits   On track    Plan to keep scheduled appointments with providers    . Client will report no fall or injuries in the next 6 months.   Not on track     ED visits for fall 02/02/20 Stand up slowly Use cane or walker at all times Use grabber to pick up objects on the floor Keep home well-lit and wear your glasses      . Client will report no worsening of symptoms related to heart disease within the next 6 months    On track    No ED or hospitalizations for heart disease Notify provider for symptoms of chest pain, sweating, nausea/vomiting, irregular heartbeat, palpitations, rapid heart rate, shortness of breath or dizziness or fainting Call 911 for severe symptoms of chest pain or shortness of breath Follow a low salt diet     . Client will verbalize knowledge of chronic lung disease as evidenced by no ED visits or Inpatient stays related to chronic lung disease    On track    No ED visits or hospitalizations for COPD  Plan to use inhalers as directed Plan to review COPD action plan in the [employer]([facility]) calendar    . Client will verbalize knowledge of self management of Hypertension as evidences by BP reading of 140/90 or less; or as  defined by provider   On track    B/P 120/90 at provider visit Plan to check B/P regularly Take B/P medications as ordered Plan to follow a low salt diet  Increase activity as tolerated     . Client/Caregiver will verbalize understanding of instructions related to self-care and safety   On track    Please contact RNCM [phone] if you are having any difficulty caring for yourself    . HEMOGLOBIN A1C < 7       Last Hemoglobin A1C 15.5% 12/13/19 Plan to check blood sugars either fasting or 1 1/2 hours after eating with goal of 80-130 fasting and 180 or less after meals as directed by your provider Plan to follow a low carbohydrate low salt diet and watch portion sizes Please review diabetes action plan in [place] calendar and call if you have any questions Follow Plate Method: (create a balanced plate): 1/2 plate vegetables (broccoli, cauliflower, carrots, brussels sprouts, turnip greens), Aim for 1 cup cooked or 2 cups raw 1/4 starch (ex- pasta, rice, roll, bread), Aim for 1/2-1 cup, Choose whole grains whenever possible 1/4 protein (ex- chicken, beef, pork & all beans), Aim for 1 palm size or 1/2 cup, choose lean protein whenever possible and limit processed meats +1 healthy fat (ex- olive oil, avocado, nuts, seeds, dressing, mayonnaise), Aim for 2-3 teaspoons    .  COMPLETED: Obtain annual  Lipid Profile, LDL-C   On track    Last completed 12/13/19 LDL 153 The goal for LDL is less than 70 mg/dL as you are at high risk for complications Try to avoid saturated fats, trans-fats and eat more fiber Plan to take statin as ordered Completed 04/20/20    . Obtain Annual Eye (retinal)  Exam    Not on track    Your last documented eye exam was on 03/19/2018 Diabetes can affect your vision. Plan to have a dilated eye exam every year    . Obtain Annual Foot Exam   On track    Check your skin and feet every day for cuts, bruises, redness, blisters, or sores. Schedule a foot exam  with your health care provider once every year    . Obtain annual screen for micro albuminuria (urine) , nephropathy (kidney problems)   Not on track    It is important for your doctor to check your urine for protein at least every year    . Obtain Hemoglobin A1C at least 2 times per year   On track    Last completed 12/13/19 It is important to have your Hemoglobin A1C checked every 6 months if you are at goal and every 3 months if you are not at goal    . Visit Primary Care Provider or Endocrinologist at least 2 times per year    On track    Primary care visit 01/23/20 Endocrinologist 03/13/20 Please schedule your annual wellness visit        Plan:  . Send unsuccessful outreach letter with a copy of individualized care plan to client . Send individualized care plan to provider   [facility] will continue to provide services for this client through 05/08/2020. The [employer] care management team will assume care 05/09/2020.  Client will be outreached by a [employer] [facility]A) RNCM in 6 months per tier level  [place], [facility], [place] Management [phone]

## 2020-04-27 ENCOUNTER — Other Ambulatory Visit: Payer: Self-pay

## 2020-04-27 NOTE — Patient Outreach (Signed)
  [facility]) Care Management Chronic Special Needs Program    04/27/2020  Name: [name], DOB: 03-19-1941  MRN: [id number]   [name] is enrolled in a chronic special needs plan for Diabetes.  [place] Management will continue to provide services for this client through 05/08/2020. The [employer] care management team will assume care 05/09/2020.  Peter Garter RN, Jackquline Denmark, CDE Chronic Care Management Coordinator [facility] Management [phone]

## 2020-05-11 DIAGNOSIS — Z794 Long term (current) use of insulin: Secondary | ICD-10-CM | POA: Diagnosis not present

## 2020-05-11 DIAGNOSIS — E119 Type 2 diabetes mellitus without complications: Secondary | ICD-10-CM | POA: Diagnosis not present

## 2020-05-12 ENCOUNTER — Other Ambulatory Visit: Payer: Self-pay

## 2020-05-29 DIAGNOSIS — R1030 Lower abdominal pain, unspecified: Secondary | ICD-10-CM | POA: Diagnosis not present

## 2020-06-09 DIAGNOSIS — E119 Type 2 diabetes mellitus without complications: Secondary | ICD-10-CM | POA: Diagnosis not present

## 2020-06-09 DIAGNOSIS — Z794 Long term (current) use of insulin: Secondary | ICD-10-CM | POA: Diagnosis not present

## 2020-06-10 DIAGNOSIS — E1165 Type 2 diabetes mellitus with hyperglycemia: Secondary | ICD-10-CM | POA: Diagnosis not present

## 2020-07-07 ENCOUNTER — Other Ambulatory Visit: Payer: HMO

## 2020-07-07 ENCOUNTER — Other Ambulatory Visit: Payer: Self-pay

## 2020-07-07 ENCOUNTER — Ambulatory Visit: Payer: HMO

## 2020-07-07 ENCOUNTER — Ambulatory Visit
Admission: RE | Admit: 2020-07-07 | Discharge: 2020-07-07 | Disposition: A | Discharge status: Home / Self Care | Payer: HMO | Source: Ambulatory Visit | Attending: Family Medicine | Admitting: Family Medicine

## 2020-07-07 DIAGNOSIS — Z794 Long term (current) use of insulin: Secondary | ICD-10-CM | POA: Diagnosis not present

## 2020-07-07 DIAGNOSIS — E119 Type 2 diabetes mellitus without complications: Secondary | ICD-10-CM | POA: Diagnosis not present

## 2020-07-07 DIAGNOSIS — Z1231 Encounter for screening mammogram for malignant neoplasm of breast: Secondary | ICD-10-CM

## 2020-07-08 ENCOUNTER — Other Ambulatory Visit: Payer: Self-pay | Admitting: Family Medicine

## 2020-07-08 DIAGNOSIS — Z853 Personal history of malignant neoplasm of breast: Secondary | ICD-10-CM | POA: Diagnosis not present

## 2020-07-08 DIAGNOSIS — N6320 Unspecified lump in the left breast, unspecified quadrant: Secondary | ICD-10-CM | POA: Diagnosis not present

## 2020-07-08 DIAGNOSIS — R5381 Other malaise: Secondary | ICD-10-CM

## 2020-07-11 ENCOUNTER — Other Ambulatory Visit: Payer: HMO

## 2020-08-07 DIAGNOSIS — E119 Type 2 diabetes mellitus without complications: Secondary | ICD-10-CM | POA: Diagnosis not present

## 2020-08-07 DIAGNOSIS — Z794 Long term (current) use of insulin: Secondary | ICD-10-CM | POA: Diagnosis not present

## 2020-08-14 ENCOUNTER — Encounter (HOSPITAL_COMMUNITY): Payer: Self-pay | Admitting: Emergency Medicine

## 2020-08-14 ENCOUNTER — Other Ambulatory Visit: Payer: Self-pay

## 2020-08-14 ENCOUNTER — Emergency Department (HOSPITAL_COMMUNITY): Payer: HMO

## 2020-08-14 ENCOUNTER — Emergency Department (HOSPITAL_COMMUNITY)
Admission: EM | Admit: 2020-08-14 | Discharge: 2020-08-14 | Disposition: A | Discharge status: Home / Self Care | Payer: HMO | Attending: Emergency Medicine | Admitting: Emergency Medicine

## 2020-08-14 DIAGNOSIS — S0993XA Unspecified injury of face, initial encounter: Secondary | ICD-10-CM | POA: Diagnosis present

## 2020-08-14 DIAGNOSIS — E1165 Type 2 diabetes mellitus with hyperglycemia: Secondary | ICD-10-CM | POA: Insufficient documentation

## 2020-08-14 DIAGNOSIS — E860 Dehydration: Secondary | ICD-10-CM | POA: Diagnosis not present

## 2020-08-14 DIAGNOSIS — R42 Dizziness and giddiness: Secondary | ICD-10-CM | POA: Diagnosis not present

## 2020-08-14 DIAGNOSIS — J45901 Unspecified asthma with (acute) exacerbation: Secondary | ICD-10-CM | POA: Insufficient documentation

## 2020-08-14 DIAGNOSIS — Z794 Long term (current) use of insulin: Secondary | ICD-10-CM | POA: Diagnosis not present

## 2020-08-14 DIAGNOSIS — Z7982 Long term (current) use of aspirin: Secondary | ICD-10-CM | POA: Insufficient documentation

## 2020-08-14 DIAGNOSIS — W06XXXA Fall from bed, initial encounter: Secondary | ICD-10-CM | POA: Diagnosis not present

## 2020-08-14 DIAGNOSIS — J449 Chronic obstructive pulmonary disease, unspecified: Secondary | ICD-10-CM | POA: Insufficient documentation

## 2020-08-14 DIAGNOSIS — I25118 Atherosclerotic heart disease of native coronary artery with other forms of angina pectoris: Secondary | ICD-10-CM | POA: Diagnosis not present

## 2020-08-14 DIAGNOSIS — S00212A Abrasion of left eyelid and periocular area, initial encounter: Secondary | ICD-10-CM | POA: Diagnosis not present

## 2020-08-14 DIAGNOSIS — Z79899 Other long term (current) drug therapy: Secondary | ICD-10-CM | POA: Insufficient documentation

## 2020-08-14 DIAGNOSIS — R457 State of emotional shock and stress, unspecified: Secondary | ICD-10-CM | POA: Diagnosis not present

## 2020-08-14 DIAGNOSIS — N390 Urinary tract infection, site not specified: Secondary | ICD-10-CM

## 2020-08-14 DIAGNOSIS — Z853 Personal history of malignant neoplasm of breast: Secondary | ICD-10-CM | POA: Insufficient documentation

## 2020-08-14 DIAGNOSIS — W19XXXA Unspecified fall, initial encounter: Secondary | ICD-10-CM

## 2020-08-14 DIAGNOSIS — M1712 Unilateral primary osteoarthritis, left knee: Secondary | ICD-10-CM | POA: Diagnosis not present

## 2020-08-14 DIAGNOSIS — Z87891 Personal history of nicotine dependence: Secondary | ICD-10-CM | POA: Diagnosis not present

## 2020-08-14 DIAGNOSIS — S0990XA Unspecified injury of head, initial encounter: Secondary | ICD-10-CM | POA: Diagnosis not present

## 2020-08-14 DIAGNOSIS — I10 Essential (primary) hypertension: Secondary | ICD-10-CM | POA: Insufficient documentation

## 2020-08-14 DIAGNOSIS — M11261 Other chondrocalcinosis, right knee: Secondary | ICD-10-CM | POA: Diagnosis not present

## 2020-08-14 DIAGNOSIS — R739 Hyperglycemia, unspecified: Secondary | ICD-10-CM

## 2020-08-14 LAB — URINALYSIS, ROUTINE W REFLEX MICROSCOPIC
Bilirubin Urine: NEGATIVE
Glucose, UA: 50 mg/dL — AB
Hgb urine dipstick: NEGATIVE
Ketones, ur: 5 mg/dL — AB
Nitrite: NEGATIVE
Protein, ur: 100 mg/dL — AB
Specific Gravity, Urine: 1.026 (ref 1.005–1.030)
WBC, UA: >50 WBC/hpf — ABNORMAL HIGH (ref 0–5)
pH: 5 (ref 5.0–8.0)

## 2020-08-14 LAB — BASIC METABOLIC PANEL
Anion gap: 13 (ref 5–15)
BUN: 20 mg/dL (ref 8–23)
CO2: 22 mmol/L (ref 22–32)
Calcium: 10.3 mg/dL (ref 8.9–10.3)
Chloride: 99 mmol/L (ref 98–111)
Creatinine, Ser: 1.16 mg/dL — ABNORMAL HIGH (ref 0.44–1.00)
GFR, Estimated: 47 mL/min — ABNORMAL LOW (ref >60)
Glucose, Bld: 356 mg/dL — ABNORMAL HIGH (ref 70–99)
Potassium: 3.7 mmol/L (ref 3.5–5.1)
Sodium: 134 mmol/L — ABNORMAL LOW (ref 135–145)

## 2020-08-14 LAB — CBC
HCT: 42.4 % (ref 36.0–46.0)
Hemoglobin: 14.1 g/dL (ref 12.0–15.0)
MCH: 29.1 pg (ref 26.0–34.0)
MCHC: 33.3 g/dL (ref 30.0–36.0)
MCV: 87.6 fL (ref 80.0–100.0)
Platelets: 311 10*3/uL (ref 150–400)
RBC: 4.84 MIL/uL (ref 3.87–5.11)
RDW: 13.6 % (ref 11.5–15.5)
WBC: 11.9 10*3/uL — ABNORMAL HIGH (ref 4.0–10.5)
nRBC: 0 % (ref 0.0–0.2)

## 2020-08-14 LAB — CBG MONITORING, ED
Glucose-Capillary: 320 mg/dL — ABNORMAL HIGH (ref 70–99)
Glucose-Capillary: 314 mg/dL — ABNORMAL HIGH (ref 70–99)
Glucose-Capillary: 228 mg/dL — ABNORMAL HIGH (ref 70–99)

## 2020-08-14 MED ORDER — SODIUM CHLORIDE 0.9 % IV BOLUS
500.0000 mL | Freq: Once | INTRAVENOUS | Status: AC
  Administered 2020-08-14: 500 mL via INTRAVENOUS

## 2020-08-14 MED ORDER — INSULIN ASPART 100 UNIT/ML ~~LOC~~ SOLN
6.0000 [IU] | Freq: Once | SUBCUTANEOUS | Status: AC
  Administered 2020-08-14: 6 [IU] via INTRAVENOUS

## 2020-08-14 MED ORDER — SODIUM CHLORIDE 0.9 % IV BOLUS
1000.0000 mL | Freq: Once | INTRAVENOUS | Status: AC
  Administered 2020-08-14: 1000 mL via INTRAVENOUS

## 2020-08-14 MED ORDER — CIPROFLOXACIN HCL 500 MG PO TABS
500.0000 mg | ORAL_TABLET | Freq: Once | ORAL | Status: AC
  Administered 2020-08-14: 500 mg via ORAL
  Filled 2020-08-14: qty 1

## 2020-08-14 MED ORDER — CIPROFLOXACIN HCL 500 MG PO TABS: 500.0000 mg | ORAL_TABLET | Freq: Two times a day (BID) | ORAL | 0 refills | Status: AC

## 2020-08-14 NOTE — ED Provider Notes (Signed)
  Physical Exam  BP (!) 101/47   Pulse 66   Temp 98.3 F (36.8 C)   Resp 18   Ht 5\' 6"  (1.676 m)   Wt 97.5 kg   SpO2 96%   BMI 34.70 kg/m   Physical Exam  ED Course/Procedures     Procedures  MDM  [name] presented with falls out of bed and some dizziness.  She was found to be hyperglycemic.  It appears that she has a urinary tract infection as a source.  I have prescribed her ciprofloxacin as it is most compatible with her allergy profile and the antibiogram.  Culture is pending.  I have counseled her to follow closely with her primary care doctor.  She was prescribed sliding scale insulin to cover her in the meantime.  Return precautions were discussed.       Arnaldo Natal, MD 08/14/20 [phone]

## 2020-08-14 NOTE — ED Provider Notes (Signed)
[facility] EMERGENCY DEPARTMENT Provider Note   CSN: [id number] Arrival date & time: 08/14/20  1118     History Chief Complaint  Patient presents with  . Dizziness  . Fall    [name] is a 80 y.o. female.  HPI Patient presents with falling twice.  States last night and then again this morning felt dizzy getting out of bed and passed out.  States she felt her abdomen rumbling and did have some diarrhea.  Feeling somewhat better sitting in bed.  States she hit her head.  Also hit both her knees with pain in both of her knees.  No headache.  Sugar is elevated.  Patient is diabetic.  No chest pain.  No trouble breathing.  Sometimes gets lightheaded when she stands up but usually not like this.  No fevers or chills.    Past Medical History:  Diagnosis Date  . Asthma   . Breast cancer ([place])   . Coronary atherosclerosis   . Depression   . DM (diabetes mellitus) ([place])   . Hyperlipidemia   . Hypertension   . OSA (obstructive sleep apnea) 01/20/2014  . Osteopenia   . Personal history of radiation therapy   . PVD (peripheral vascular disease) [facility])     Patient Active Problem List   Diagnosis Date Noted  . COPD (chronic obstructive pulmonary disease) ([place]) 07/08/2016  . Acute bronchitis 04/26/2016  . Asthma with acute exacerbation 04/26/2016  . Obesity (BMI 30-39.9) 12/20/2014  . Angina pectoris ([place]) 11/11/2014  . Breast cancer, female ([place]) 11/11/2014  . Benign neoplasm of colon 11/11/2014  . CAFL (chronic airflow limitation) ([place]) 11/11/2014  . Clinical depression 11/11/2014  . Major depressive episode 11/11/2014  . Diabetes mellitus with peripheral circulatory disorder ([place]) 11/11/2014  . Diabetes mellitus, type 2 ([place]) 11/11/2014  . Diabetes mellitus type 2, uncontrolled ([place]) 11/11/2014  . Benign essential HTN 11/11/2014  . Combined fat and carbohydrate induced hyperlipemia 11/11/2014  . Adiposity 11/11/2014  . Bone/cartilage disorder  11/11/2014  . Blood clot associated with vein wall inflammation 11/11/2014  . Hypercholesterolemia without hypertriglyceridemia 11/11/2014  . Peripheral blood vessel disorder ([place]) 11/11/2014  . Left foot pain 11/11/2014  . Dysesthesia 11/11/2014  . Carotid bruit 11/11/2014  . COPD GOLD 0 vs asthma 08/26/2014  . Venous insufficiency of right lower extremity 08/26/2014  . OSA (obstructive sleep apnea) 01/20/2014  . Type II diabetes mellitus with complication ([place]) 27/07/5007  . Abnormal echocardiogram 12/11/2013  . Dermatitis, eczematoid 12/11/2013  . Coronary artery disease involving native coronary artery of native heart with angina pectoris ([place]) 06/10/2013  . Nocturnal hypoxemia 10/24/2012  . Hyperlipidemia 05/22/2008  . Dyspnea 05/22/2008  . NEOPLASM, MALIGNANT, BREAST, HX OF 05/22/2008    Past Surgical History:  Procedure Laterality Date  . APPENDECTOMY    . BREAST LUMPECTOMY     1992 on left, 2007 on right  . CHOLECYSTECTOMY    . CYST REMOVAL NECK       OB History   No obstetric history on file.     Family History  Problem Relation Age of Onset  . Heart disease Mother   . Heart disease Father     Social History   Tobacco Use  . Smoking status: Former Smoker    Packs/day: 1.00    Years: 40.00    Pack years: 40.00    Types: Cigarettes    Quit date: 05/10/2003    Years since quitting: 17.2  . Smokeless tobacco: Never  Used  Vaping Use  . Vaping Use: Never used  Substance Use Topics  . Alcohol use: No  . Drug use: No    Home Medications Prior to Admission medications   Medication Sig Start Date End Date Taking? Authorizing Provider  acetaminophen (TYLENOL) 325 MG tablet Take 650 mg by mouth every 6 (six) hours as needed for mild pain or headache.    [provider]  albuterol (PROAIR HFA) 108 (90 Base) MCG/ACT inhaler Inhale 1-2 puffs into the lungs every 6 (six) hours as needed for wheezing or shortness of breath. 06/27/19   Parrett, Fonnie Mu, NP   aspirin 81 MG tablet Take 81 mg by mouth daily.    [provider]  azelastine (ASTELIN) 137 MCG/SPRAY nasal spray Place 1 spray into both nostrils 2 (two) times daily as needed for rhinitis.  04/09/13   [provider]  cholecalciferol (VITAMIN D) 1000 UNITS tablet Take 2,000 Units by mouth daily.    [provider]  insulin NPH Human (NOVOLIN N) 100 UNIT/ML injection Inject 20 Units into the skin 2 (two) times daily before a meal.     [provider]  insulin regular (NOVOLIN R,HUMULIN R) 100 units/mL injection Inject 30-40 Units into the skin 3 (three) times daily before meals. Sliding scale     [provider]  irbesartan (AVAPRO) 75 MG tablet Take 1 tablet (75 mg total) by mouth daily. 03/28/16   Tanda Rockers, MD  isosorbide mononitrate (IMDUR) 60 MG 24 hr tablet Take 1 tablet by mouth once daily 12/24/18   Belva Crome, MD  rosuvastatin (CRESTOR) 40 MG tablet Take 40 mg by mouth daily.    [provider]  SEEBRI NEOHALER 15.6 MCG CAPS INHALE 1 PUFF INTO LUNGS TWICE DAILY Patient not taking: Reported on 02/02/2020 05/23/18   Chesley Mires, MD  sertraline (ZOLOFT) 100 MG tablet Take 100 mg by mouth daily.    [provider]    Allergies    Niacin, Penicillins, Saccharin, Amoxicillin, Codeine, Diclofenac sodium, Erythromycin, Glipizide, Metformin and related, Methocarbamol, Ramipril, Sulfa antibiotics, and Tetanus toxoid  Review of Systems   Review of Systems  Constitutional: Negative for appetite change.  HENT: Negative for congestion.   Respiratory: Negative for shortness of breath.   Cardiovascular: Negative for chest pain.  Gastrointestinal: Positive for diarrhea.  Genitourinary: Negative for dysuria.  Musculoskeletal:       Bilateral knee pain.  Skin: Negative for rash.  Neurological: Positive for light-headedness.  Psychiatric/Behavioral: Negative for confusion.    Physical Exam Updated Vital Signs BP (!) 97/44    Pulse 68   Temp 98.3 F (36.8 C)   Resp 19   Ht 5\' 6"  (1.676 m)   Wt 97.5 kg   SpO2 96%   BMI 34.70 kg/m   Physical Exam Vitals and nursing note reviewed.  HENT:     Head:     Comments: Mild abrasion to left periorbital area.    Mouth/Throat:     Mouth: Mucous membranes are moist.  Eyes:     Extraocular Movements: Extraocular movements intact.  Cardiovascular:     Rate and Rhythm: Normal rate and regular rhythm.  Pulmonary:     Breath sounds: No wheezing or rhonchi.  Abdominal:     Tenderness: There is no abdominal tenderness.  Musculoskeletal:        General: Tenderness present.     Cervical back: Neck supple.  Neurological:     Mental Status: She is  alert.     ED Results / Procedures / Treatments   Labs (all labs ordered are listed, but only abnormal results are displayed) Labs Reviewed  BASIC METABOLIC PANEL - Abnormal; Notable for the following components:      Result Value   Sodium 134 (*)    Glucose, Bld 356 (*)    Creatinine, Ser 1.16 (*)    GFR, Estimated 47 (*)    All other components within normal limits  CBC - Abnormal; Notable for the following components:   WBC 11.9 (*)    All other components within normal limits  CBG MONITORING, ED - Abnormal; Notable for the following components:   Glucose-Capillary 320 (*)    All other components within normal limits  CBG MONITORING, ED - Abnormal; Notable for the following components:   Glucose-Capillary 314 (*)    All other components within normal limits  URINALYSIS, ROUTINE W REFLEX MICROSCOPIC    EKG EKG Interpretation  Date/Time:  Friday August 14 2020 11:29:00 EDT Ventricular Rate:  66 PR Interval:  220 QRS Duration: 84 QT Interval:  413 QTC Calculation: 433 R Axis:   15 Text Interpretation: Sinus rhythm Ventricular premature complex Borderline prolonged PR interval Confirmed by Davonna Belling [phone]) on 08/14/2020 3:44:53 PM   Radiology CT Head Wo Contrast  Result Date:  08/14/2020 CLINICAL DATA:  Multiple falls, dizziness EXAM: CT HEAD WITHOUT CONTRAST TECHNIQUE: Contiguous axial images were obtained from the base of the skull through the vertex without intravenous contrast. COMPARISON:  02/02/2020 FINDINGS: Brain: No evidence of acute infarction, hemorrhage, hydrocephalus, extra-axial collection or mass lesion/mass effect. Mild periventricular deep white matter hypodensity Vascular: No hyperdense vessel or unexpected calcification. Skull: Normal. Negative for fracture or focal lesion. Sinuses/Orbits: No acute finding. Other: None. IMPRESSION: No acute intracranial pathology. Small-vessel white matter disease. Electronically Signed   By: Eddie Candle M.D.   On: 08/14/2020 13:42   DG Knee Complete 4 Views Left  Result Date: 08/14/2020 CLINICAL DATA:  Fall with knee pain EXAM: LEFT KNEE - COMPLETE 4+ VIEW COMPARISON:  None. FINDINGS: No evidence for an acute fracture no subluxation or dislocation. Loss of joint space noted medial compartment with mild hypertrophic spurring visible in all 3 compartments. No evidence for joint effusion. IMPRESSION: Tricompartmental degenerative changes without acute bony abnormality. Electronically Signed   By: Misty Stanley M.D.   On: 08/14/2020 13:39   DG Knee Complete 4 Views Right  Result Date: 08/14/2020 CLINICAL DATA:  Fall.  Knee pain. EXAM: RIGHT KNEE - COMPLETE 4+ VIEW COMPARISON:  10/03/2013 FINDINGS: There is no joint effusion identified. Chondrocalcinosis noted. Mild tricompartment marginal spur formation and sharpening of the tibial spines. Lateral compartment narrowing identified. IMPRESSION: 1. No acute abnormality. 2. Osteoarthritis and chondrocalcinosis Electronically Signed   By: Kerby Moors M.D.   On: 08/14/2020 13:18    Procedures Procedures   Medications Ordered in ED Medications  insulin aspart (novoLOG) injection 6 Units (has no administration in time range)  sodium chloride 0.9 % bolus 500 mL (has no  administration in time range)  sodium chloride 0.9 % bolus 1,000 mL (0 mLs Intravenous Stopped 08/14/20 1528)    ED Course  I have reviewed the triage vital signs and the nursing notes.  Pertinent labs & imaging results that were available during my care of the patient were reviewed by me and considered in my medical decision making (see chart for details).    MDM Rules/Calculators/A&P  Patient presents after fall.  Got dizzy with standing.  Hyperglycemia.  Likely component of dehydration with this.  Traumatic work-up showed chronic problems with the knees but no acute injury.  Head CT also reassuring.  Feels better has ambulated.  However does have a sugar of 300.  Is not appear to be in DKA.  Has had 1 L fluid bolus with mild improvement in the sugar.  We will give some insulin and more fluid.  Likely be able to discharge home.  Care turned over to Dr. Joya Gaskins. Final Clinical Impression(s) / ED Diagnoses Final diagnoses:  Dehydration  Fall, initial encounter  Minor head injury, initial encounter  Hyperglycemia    Rx / DC Orders ED Discharge Orders    None       Davonna Belling, MD 08/14/20 1614

## 2020-08-14 NOTE — ED Notes (Signed)
Patient ambulated in hallway with RN at this time. Per patient she felt at baseline, x1 assist, typically ambulates with walker. Denied dizziness or feeling unsteady with this RN. Patient also was able to provide UA at this time.

## 2020-08-14 NOTE — ED Notes (Signed)
Patient and daughter given discharge paperwork and prescription. Verbalized understanding of teaching. IV d/c with cath tip intact. Wheeled to exit in NAD.

## 2020-08-14 NOTE — ED Notes (Signed)
While doing orthostatics, pt stated to this tech while sitting on the side of the bed that "something just don't feel right". This tech ask pt if she felt dizzy or light headed, pt told this tech, "no but I just have this feeling and I just don't feel right. This tech help pt lay back down into bed. Nurse notified.

## 2020-08-14 NOTE — ED Triage Notes (Signed)
Arrived via EMS from home. Patient fell out of bed called EMS who helped patient back in bed. [place] out of the bed second time called EMS and brought patient to the ED for evaluation. Patient has abrasion left side of face and left jaw along with left knee. Patient states fell out of bed both times trying to get out of bed and was dizzy. Denies dizziness currently and alert answering and following commands appropriate.

## 2020-08-14 NOTE — Discharge Instructions (Addendum)
Follow-up with your doctors for the elevated sugar and the dehydration

## 2020-08-18 LAB — URINE CULTURE: Culture: >=100000 — AB

## 2020-08-19 ENCOUNTER — Telehealth: Payer: Self-pay | Admitting: *Deleted

## 2020-08-19 NOTE — Telephone Encounter (Signed)
Post ED Visit - Positive Culture Follow-up  Culture report reviewed by antimicrobial stewardship pharmacist: [place] Team []  Elenor Quinones, Pharm.D. []  Heide Guile, Pharm.D., BCPS AQ-ID []  Parks Neptune, Pharm.D., BCPS []  Alycia Rossetti, Pharm.D., BCPS []  [place], Pharm.D., BCPS, AAHIVP []  Legrand Como, Pharm.D., BCPS, AAHIVP []  Salome Arnt, PharmD, BCPS []  Johnnette Gourd, PharmD, BCPS []  Hughes Better, PharmD, BCPS []  Leeroy Cha, PharmD []  Laqueta Linden, PharmD, BCPS []  Albertina Parr, PharmD  [place] Team []  Leodis Sias, PharmD []  Lindell Spar, PharmD []  Royetta Asal, PharmD []  Graylin Shiver, Rph []  Rema Fendt) Glennon Mac, PharmD []  Arlyn Dunning, PharmD []  Netta Cedars, PharmD []  Dia Sitter, PharmD []  Leone Haven, PharmD []  Gretta Arab, PharmD []  Theodis Shove, PharmD []  Peggyann Juba, PharmD []  Reuel Boom, PharmD   Positive urine culture Treated with Ciprofloxacin, organism sensitive to the same and no further patient follow-up is required at this time.  Harlon Flor [facility] 08/19/2020, 10:29 AM

## 2020-08-20 ENCOUNTER — Ambulatory Visit
Admission: RE | Admit: 2020-08-20 | Discharge: 2020-08-20 | Disposition: A | Discharge status: Home / Self Care | Payer: HMO | Source: Ambulatory Visit | Attending: Family Medicine | Admitting: Family Medicine

## 2020-08-20 ENCOUNTER — Other Ambulatory Visit: Payer: Self-pay

## 2020-08-20 ENCOUNTER — Other Ambulatory Visit: Payer: Self-pay | Admitting: Family Medicine

## 2020-08-20 DIAGNOSIS — N6323 Unspecified lump in the left breast, lower outer quadrant: Secondary | ICD-10-CM | POA: Diagnosis not present

## 2020-08-20 DIAGNOSIS — N6320 Unspecified lump in the left breast, unspecified quadrant: Secondary | ICD-10-CM

## 2020-08-20 DIAGNOSIS — N6321 Unspecified lump in the left breast, upper outer quadrant: Secondary | ICD-10-CM | POA: Diagnosis not present

## 2020-08-20 DIAGNOSIS — C50919 Malignant neoplasm of unspecified site of unspecified female breast: Secondary | ICD-10-CM | POA: Insufficient documentation

## 2020-08-20 DIAGNOSIS — R922 Inconclusive mammogram: Secondary | ICD-10-CM | POA: Diagnosis not present

## 2020-08-20 DIAGNOSIS — C50911 Malignant neoplasm of unspecified site of right female breast: Secondary | ICD-10-CM

## 2020-08-20 DIAGNOSIS — C50912 Malignant neoplasm of unspecified site of left female breast: Secondary | ICD-10-CM

## 2020-08-20 DIAGNOSIS — Z853 Personal history of malignant neoplasm of breast: Secondary | ICD-10-CM | POA: Diagnosis not present

## 2020-08-25 ENCOUNTER — Other Ambulatory Visit: Payer: HMO

## 2020-09-15 ENCOUNTER — Other Ambulatory Visit: Payer: HMO

## 2020-09-16 DIAGNOSIS — Z794 Long term (current) use of insulin: Secondary | ICD-10-CM | POA: Diagnosis not present

## 2020-09-16 DIAGNOSIS — R413 Other amnesia: Secondary | ICD-10-CM | POA: Diagnosis not present

## 2020-09-16 DIAGNOSIS — E7800 Pure hypercholesterolemia, unspecified: Secondary | ICD-10-CM | POA: Diagnosis not present

## 2020-09-16 DIAGNOSIS — I10 Essential (primary) hypertension: Secondary | ICD-10-CM | POA: Diagnosis not present

## 2020-09-16 DIAGNOSIS — E1169 Type 2 diabetes mellitus with other specified complication: Secondary | ICD-10-CM | POA: Diagnosis not present

## 2020-09-22 DIAGNOSIS — I10 Essential (primary) hypertension: Secondary | ICD-10-CM | POA: Diagnosis not present

## 2020-09-22 DIAGNOSIS — E1165 Type 2 diabetes mellitus with hyperglycemia: Secondary | ICD-10-CM | POA: Diagnosis not present

## 2020-09-22 DIAGNOSIS — E7800 Pure hypercholesterolemia, unspecified: Secondary | ICD-10-CM | POA: Diagnosis not present

## 2020-09-25 ENCOUNTER — Other Ambulatory Visit: Payer: Self-pay | Admitting: Interventional Cardiology

## 2020-09-25 NOTE — Telephone Encounter (Signed)
Anderson Malta, this pt has not been seen since Aug, 2020. Does Dr. [name] want to prescribe this medication for pt? Please advise. Thanks

## 2020-09-28 NOTE — Telephone Encounter (Signed)
Ok to fill.  Just use the protocol for overdue patients. Thanks.

## 2020-10-19 DIAGNOSIS — E1165 Type 2 diabetes mellitus with hyperglycemia: Secondary | ICD-10-CM | POA: Diagnosis not present

## 2020-10-19 DIAGNOSIS — E7800 Pure hypercholesterolemia, unspecified: Secondary | ICD-10-CM | POA: Diagnosis not present

## 2020-10-19 DIAGNOSIS — I10 Essential (primary) hypertension: Secondary | ICD-10-CM | POA: Diagnosis not present

## 2020-10-26 ENCOUNTER — Other Ambulatory Visit: Payer: Self-pay

## 2020-10-26 ENCOUNTER — Emergency Department (HOSPITAL_COMMUNITY)
Admission: EM | Admit: 2020-10-26 | Discharge: 2020-10-27 | Disposition: A | Discharge status: Home / Self Care | Payer: HMO | Attending: Emergency Medicine | Admitting: Emergency Medicine

## 2020-10-26 ENCOUNTER — Emergency Department (HOSPITAL_COMMUNITY): Payer: HMO

## 2020-10-26 DIAGNOSIS — R1013 Epigastric pain: Secondary | ICD-10-CM

## 2020-10-26 DIAGNOSIS — R1010 Upper abdominal pain, unspecified: Secondary | ICD-10-CM | POA: Diagnosis not present

## 2020-10-26 DIAGNOSIS — Z87891 Personal history of nicotine dependence: Secondary | ICD-10-CM | POA: Insufficient documentation

## 2020-10-26 DIAGNOSIS — Z79899 Other long term (current) drug therapy: Secondary | ICD-10-CM | POA: Insufficient documentation

## 2020-10-26 DIAGNOSIS — J449 Chronic obstructive pulmonary disease, unspecified: Secondary | ICD-10-CM | POA: Diagnosis not present

## 2020-10-26 DIAGNOSIS — J45901 Unspecified asthma with (acute) exacerbation: Secondary | ICD-10-CM | POA: Insufficient documentation

## 2020-10-26 DIAGNOSIS — Z853 Personal history of malignant neoplasm of breast: Secondary | ICD-10-CM | POA: Insufficient documentation

## 2020-10-26 DIAGNOSIS — E1165 Type 2 diabetes mellitus with hyperglycemia: Secondary | ICD-10-CM | POA: Diagnosis not present

## 2020-10-26 DIAGNOSIS — R0902 Hypoxemia: Secondary | ICD-10-CM | POA: Diagnosis not present

## 2020-10-26 DIAGNOSIS — K439 Ventral hernia without obstruction or gangrene: Secondary | ICD-10-CM | POA: Diagnosis not present

## 2020-10-26 DIAGNOSIS — Z9012 Acquired absence of left breast and nipple: Secondary | ICD-10-CM | POA: Diagnosis not present

## 2020-10-26 DIAGNOSIS — E119 Type 2 diabetes mellitus without complications: Secondary | ICD-10-CM | POA: Diagnosis not present

## 2020-10-26 DIAGNOSIS — I959 Hypotension, unspecified: Secondary | ICD-10-CM | POA: Diagnosis not present

## 2020-10-26 DIAGNOSIS — Z7982 Long term (current) use of aspirin: Secondary | ICD-10-CM | POA: Insufficient documentation

## 2020-10-26 DIAGNOSIS — N281 Cyst of kidney, acquired: Secondary | ICD-10-CM | POA: Diagnosis not present

## 2020-10-26 DIAGNOSIS — Z794 Long term (current) use of insulin: Secondary | ICD-10-CM | POA: Diagnosis not present

## 2020-10-26 DIAGNOSIS — D259 Leiomyoma of uterus, unspecified: Secondary | ICD-10-CM | POA: Diagnosis not present

## 2020-10-26 DIAGNOSIS — R1084 Generalized abdominal pain: Secondary | ICD-10-CM | POA: Diagnosis not present

## 2020-10-26 DIAGNOSIS — I119 Hypertensive heart disease without heart failure: Secondary | ICD-10-CM | POA: Insufficient documentation

## 2020-10-26 DIAGNOSIS — I2510 Atherosclerotic heart disease of native coronary artery without angina pectoris: Secondary | ICD-10-CM | POA: Diagnosis not present

## 2020-10-26 DIAGNOSIS — K429 Umbilical hernia without obstruction or gangrene: Secondary | ICD-10-CM | POA: Diagnosis not present

## 2020-10-26 LAB — CBC WITH DIFFERENTIAL/PLATELET
Abs Immature Granulocytes: 0.05 10*3/uL (ref 0.00–0.07)
Basophils Absolute: 0 10*3/uL (ref 0.0–0.1)
Basophils Relative: 0 %
Eosinophils Absolute: 0 10*3/uL (ref 0.0–0.5)
Eosinophils Relative: 0 %
HCT: 40.6 % (ref 36.0–46.0)
Hemoglobin: 13.3 g/dL (ref 12.0–15.0)
Immature Granulocytes: 0 %
Lymphocytes Relative: 12 %
Lymphs Abs: 1.7 10*3/uL (ref 0.7–4.0)
MCH: 28.7 pg (ref 26.0–34.0)
MCHC: 32.8 g/dL (ref 30.0–36.0)
MCV: 87.7 fL (ref 80.0–100.0)
Monocytes Absolute: 0.8 10*3/uL (ref 0.1–1.0)
Monocytes Relative: 6 %
Neutro Abs: 12.1 10*3/uL — ABNORMAL HIGH (ref 1.7–7.7)
Neutrophils Relative %: 82 %
Platelets: 340 10*3/uL (ref 150–400)
RBC: 4.63 MIL/uL (ref 3.87–5.11)
RDW: 13.1 % (ref 11.5–15.5)
WBC: 14.8 10*3/uL — ABNORMAL HIGH (ref 4.0–10.5)
nRBC: 0 % (ref 0.0–0.2)

## 2020-10-26 LAB — COMPREHENSIVE METABOLIC PANEL
ALT: 14 U/L (ref 0–44)
AST: 19 U/L (ref 15–41)
Albumin: 3.3 g/dL — ABNORMAL LOW (ref 3.5–5.0)
Alkaline Phosphatase: 59 U/L (ref 38–126)
Anion gap: 10 (ref 5–15)
BUN: 17 mg/dL (ref 8–23)
CO2: 24 mmol/L (ref 22–32)
Calcium: 10.1 mg/dL (ref 8.9–10.3)
Chloride: 99 mmol/L (ref 98–111)
Creatinine, Ser: 0.86 mg/dL (ref 0.44–1.00)
GFR, Estimated: >60 mL/min (ref >60)
Glucose, Bld: 292 mg/dL — ABNORMAL HIGH (ref 70–99)
Potassium: 4.1 mmol/L (ref 3.5–5.1)
Sodium: 133 mmol/L — ABNORMAL LOW (ref 135–145)
Total Bilirubin: 0.6 mg/dL (ref 0.3–1.2)
Total Protein: 6.7 g/dL (ref 6.5–8.1)

## 2020-10-26 MED ORDER — ONDANSETRON HCL 4 MG/2ML IJ SOLN
4.0000 mg | Freq: Once | INTRAMUSCULAR | Status: AC
  Administered 2020-10-26: 4 mg via INTRAVENOUS
  Filled 2020-10-26: qty 2

## 2020-10-26 MED ORDER — FENTANYL CITRATE (PF) 100 MCG/2ML IJ SOLN
50.0000 ug | Freq: Once | INTRAMUSCULAR | Status: AC
  Administered 2020-10-26: 50 ug via INTRAVENOUS
  Filled 2020-10-26: qty 2

## 2020-10-26 MED ORDER — IOHEXOL 300 MG/ML  SOLN
100.0000 mL | Freq: Once | INTRAMUSCULAR | Status: AC | PRN
  Administered 2020-10-26: 100 mL via INTRAVENOUS

## 2020-10-26 NOTE — ED Notes (Signed)
Patient transported to CT 

## 2020-10-26 NOTE — ED Triage Notes (Signed)
Pt BIB [facility] for epigastric pain radiating to the left flank x 1-2 hrs.  No NVD.  PT has hx of hernia. Pt also has left shoulder pain with hx of same.   EMS endorsed 02 sats around 92-94% on RA. Placed on 2L, sats increased to 96%. Pt wears 02 when sleeping.   12 lead unremarkable.  PT is left side restricted d/t hx of breast cancer.

## 2020-10-26 NOTE — ED Provider Notes (Signed)
[facility] EMERGENCY DEPARTMENT Provider Note   CSN: [id number] Arrival date & time: 10/26/20  1707     History Chief Complaint  Patient presents with   Abdominal Pain    [name] is a 80 y.o. female.  HPI  She presents for evaluation of upper abdominal pain, in the midline, that started 2 PM today.  She did not eat breakfast or lunch today.  She typically skips breakfast, and did not eat the midday meal because of lack of appetite.  She has not had any vomiting, diarrhea, change in bowel or urinary habits.  She denies fever, chills, cough or shortness of breath.  She has a known upper abdominal hernia, that never goes away, but she does not feel that it is tender at this time.  No other recent illnesses.  She has not had a COVID booster but did get the primary vaccines.  No known sick contacts.  No recent illnesses.      Past Medical History:  Diagnosis Date   Asthma    Breast cancer ([place])    LT 1992, RT 2007   Coronary atherosclerosis    Depression    DM (diabetes mellitus) ([place])    Hyperlipidemia    Hypertension    OSA (obstructive sleep apnea) 01/20/2014   Osteopenia    Personal history of radiation therapy    PVD (peripheral vascular disease) ([place])     Patient Active Problem List   Diagnosis Date Noted   Breast cancer ([place])    COPD (chronic obstructive pulmonary disease) ([place]) 07/08/2016   Acute bronchitis 04/26/2016   Asthma with acute exacerbation 04/26/2016   Obesity (BMI 30-39.9) 12/20/2014   Angina pectoris ([place]) 11/11/2014   Breast cancer, female ([place]) 11/11/2014   Benign neoplasm of colon 11/11/2014   CAFL (chronic airflow limitation) ([place]) 11/11/2014   Clinical depression 11/11/2014   Major depressive episode 11/11/2014   Diabetes mellitus with peripheral circulatory disorder ([place]) 11/11/2014   Diabetes mellitus, type 2 ([place]) 11/11/2014   Diabetes mellitus type 2, uncontrolled ([place]) 11/11/2014   Benign essential HTN  11/11/2014   Combined fat and carbohydrate induced hyperlipemia 11/11/2014   Adiposity 11/11/2014   Bone/cartilage disorder 11/11/2014   Blood clot associated with vein wall inflammation 11/11/2014   Hypercholesterolemia without hypertriglyceridemia 11/11/2014   Peripheral blood vessel disorder ([place]) 11/11/2014   Left foot pain 11/11/2014   Dysesthesia 11/11/2014   Carotid bruit 11/11/2014   COPD GOLD 0 vs asthma 08/26/2014   Venous insufficiency of right lower extremity 08/26/2014   OSA (obstructive sleep apnea) 01/20/2014   Type II diabetes mellitus with complication ([place]) 07/62/2633   Abnormal echocardiogram 12/11/2013   Dermatitis, eczematoid 12/11/2013   Coronary artery disease involving native coronary artery of native heart with angina pectoris ([place]) 06/10/2013   Nocturnal hypoxemia 10/24/2012   Hyperlipidemia 05/22/2008   Dyspnea 05/22/2008   NEOPLASM, MALIGNANT, BREAST, HX OF 05/22/2008    Past Surgical History:  Procedure Laterality Date   APPENDECTOMY     BREAST LUMPECTOMY     1992 on left, 2007 on right   CHOLECYSTECTOMY     CYST REMOVAL NECK       OB History   No obstetric history on file.     Family History  Problem Relation Age of Onset   Heart disease Mother    Heart disease Father     Social History   Tobacco Use   Smoking status: Former    Packs/day: 1.00  Years: 40.00    Pack years: 40.00    Types: Cigarettes    Quit date: 05/10/2003    Years since quitting: 17.4   Smokeless tobacco: Never  Vaping Use   Vaping Use: Never used  Substance Use Topics   Alcohol use: No   Drug use: No    Home Medications Prior to Admission medications   Medication Sig Start Date End Date Taking? Authorizing Provider  acetaminophen (TYLENOL) 500 MG tablet Take 500-1,000 mg by mouth every 6 (six) hours as needed (pain).   Yes [provider]  albuterol (PROAIR HFA) 108 (90 Base) MCG/ACT inhaler Inhale 1-2 puffs into the lungs every 6 (six) hours as  needed for wheezing or shortness of breath. 06/27/19  Yes Parrett, Tammy S, NP  aspirin EC 81 MG tablet Take 81 mg by mouth every morning. Swallow whole.   Yes [provider]  Cholecalciferol (VITAMIN D) 50 MCG (2000 UT) tablet Take 2,000 Units by mouth every morning.   Yes [provider]  insulin isophane & regular human (NOVOLIN 70/30 FLEXPEN) (70-30) 100 UNIT/ML KwikPen Inject 5-25 Units into the skin See admin instructions. Inject 25 units subcutaneously before breakfast, 5 units before lunch and 25 units before supper   Yes [provider]  irbesartan (AVAPRO) 75 MG tablet Take 1 tablet (75 mg total) by mouth daily. Patient taking differently: Take 75 mg by mouth every morning. 03/28/16  Yes Tanda Rockers, MD  isosorbide mononitrate (IMDUR) 60 MG 24 hr tablet Take 1 tablet (60 mg total) by mouth daily. Please make overdue appt with Dr. [name] first or refill with PCP before anymore refills. Thank you Final Attempt Patient taking differently: Take 60 mg by mouth every morning. Please make overdue appt with Dr. [name] first or refill with PCP before anymore refills. Thank you Final Attempt 09/28/20  Yes Belva Crome, MD  OXYGEN Inhale 2 L into the lungs See admin instructions. Use whenever sleeping - naps and overnight   Yes [provider]  rosuvastatin (CRESTOR) 40 MG tablet Take 40 mg by mouth at bedtime.   Yes [provider]  sertraline (ZOLOFT) 100 MG tablet Take 100 mg by mouth every morning.   Yes [provider]    Allergies    Niacin, Penicillins, Saccharin, Amoxicillin, Codeine, Diclofenac sodium, Erythromycin, Glipizide, Metformin and related, Methocarbamol, Ramipril, Sulfa antibiotics, and Tetanus toxoid  Review of Systems   Review of Systems  All other systems reviewed and are negative.  Physical Exam Updated Vital Signs BP (!) 105/44   Pulse 69   Temp (!) 100.7 F (38.2 C) (Oral)   Resp 16   Wt 91.2 kg   SpO2 93%    BMI 32.44 kg/m   Physical Exam Vitals and nursing note reviewed.  Constitutional:      Appearance: She is well-developed. She is obese. She is not ill-appearing, toxic-appearing or diaphoretic.  HENT:     Head: Normocephalic and atraumatic.     Right Ear: External ear normal.     Left Ear: External ear normal.  Eyes:     Conjunctiva/sclera: Conjunctivae normal.     Pupils: Pupils are equal, round, and reactive to light.  Neck:     Trachea: Phonation normal.  Cardiovascular:     Rate and Rhythm: Normal rate and regular rhythm.     Heart sounds: Normal heart sounds.  Pulmonary:     Effort: Pulmonary effort is normal.     Breath sounds: Normal  breath sounds.  Abdominal:     General: There is no distension.     Palpations: Abdomen is soft.     Tenderness: There is no abdominal tenderness (Epigastric, mild).     Comments: Reducible hernia present in the midline upper abdomen.  Hernia recurs after initial reduction.  Musculoskeletal:        General: Normal range of motion.     Cervical back: Normal range of motion and neck supple.  Skin:    General: Skin is warm and dry.  Neurological:     Mental Status: She is alert and oriented to person, place, and time.     Cranial Nerves: No cranial nerve deficit.     Sensory: No sensory deficit.     Motor: No abnormal muscle tone.     Coordination: Coordination normal.  Psychiatric:        Behavior: Behavior normal.        Thought Content: Thought content normal.        Judgment: Judgment normal.    ED Results / Procedures / Treatments   Labs (all labs ordered are listed, but only abnormal results are displayed) Labs Reviewed  COMPREHENSIVE METABOLIC PANEL - Abnormal; Notable for the following components:      Result Value   Sodium 133 (*)    Glucose, Bld 292 (*)    Albumin 3.3 (*)    All other components within normal limits  CBC WITH DIFFERENTIAL/PLATELET - Abnormal; Notable for the following components:   WBC 14.8 (*)     Neutro Abs 12.1 (*)    All other components within normal limits  URINALYSIS, ROUTINE W REFLEX MICROSCOPIC    EKG None  Radiology CT Abdomen Pelvis W Contrast  Result Date: 10/26/2020 CLINICAL DATA:  Evaluate for bowel obstruction. EXAM: CT ABDOMEN AND PELVIS WITH CONTRAST TECHNIQUE: Multidetector CT imaging of the abdomen and pelvis was performed using the standard protocol following bolus administration of intravenous contrast. CONTRAST:  117mL OMNIPAQUE IOHEXOL 300 MG/ML  SOLN COMPARISON:  None. FINDINGS: Lower chest: There is abnormal asymmetric skin thickening and subcutaneous soft tissue infiltration involving the visualized portions of the right breast, image 1/3. Scar is identified within the lingula and posterior medial left lower lobe. Hepatobiliary: Normal appearance of the liver. Status post cholecystectomy. Nose significant bile duct dilatation. Pancreas: Increased caliber of the main duct at the level of the tail of pancreas is identified. No discrete mass identified within the pancreas. No pancreatic inflammation noted. Spleen: Normal in size without focal abnormality. Adrenals/Urinary Tract: Normal adrenal glands. Left upper pole kidney cyst measures 3.6 cm. Inferior pole right kidney cyst measures 1.4 cm. No hydronephrosis identified bilaterally. Bladder is unremarkable. Stomach/Bowel: Stomach appears normal. Appendectomy. No bowel wall thickening, inflammation, or distension. Within the supraumbilical ventral abdominal wall there is a midline hernia which contains a nonobstructed loop of transverse colon measuring 8.8 cm in diameter, image 32/3. Multiple colonic diverticula identified without signs of acute diverticulitis. Vascular/Lymphatic: Aortic atherosclerosis. No enlarged abdominal or pelvic lymph nodes. Reproductive: Calcified fibroid within the anterior fundus of the uterus measures 2.3 cm. No adnexal mass identified. Other: No free fluid or fluid collections. Musculoskeletal: No  acute or significant osseous findings. First degree anterolisthesis of L4 on L5 noted. Here, there is a posterior disc bulge. No acute or suspicious osseous findings. IMPRESSION: 1. No acute findings identified within the abdomen or pelvis. No evidence for bowel obstruction. 2. Midline supraumbilical ventral abdominal wall hernia contains a nonobstructed loop of transverse  colon. 3. Increased caliber of the main duct at the level of the tail of pancreas. No discrete mass identified. This does not need immediate attention. However, when the patient is clinically stable and able to follow directions and hold their breath (preferably as an outpatient) further evaluation with dedicated abdominal MRI/MRCP without and with contrast material should be considered. 4. Asymmetric skin thickening and subcutaneous soft tissue infiltration involving the visualized portions of the right breast. This is consistent with previously described post treatment changes. 5. Aortic atherosclerosis. Aortic Atherosclerosis (ICD10-I70.0). Electronically Signed   By: Kerby Moors M.D.   On: 10/26/2020 23:25    Procedures Procedures   Medications Ordered in ED Medications  fentaNYL (SUBLIMAZE) injection 50 mcg (50 mcg Intravenous Given 10/26/20 1953)  ondansetron (ZOFRAN) injection 4 mg (4 mg Intravenous Given 10/26/20 1953)  iohexol (OMNIPAQUE) 300 MG/ML solution 100 mL (100 mLs Intravenous Contrast Given 10/26/20 2312)    ED Course  I have reviewed the triage vital signs and the nursing notes.  Pertinent labs & imaging results that were available during my care of the patient were reviewed by me and considered in my medical decision making (see chart for details).    MDM Rules/Calculators/A&P                           Patient Vitals for the past 24 hrs:  BP Temp Temp src Pulse Resp SpO2 Weight  10/26/20 2224 -- (!) 100.7 F (38.2 C) Oral -- -- -- --  10/26/20 2219 (!) 105/44 -- -- 69 16 93 % --  10/26/20 2030 (!)  108/26 -- -- 72 16 91 % --  10/26/20 2000 (!) 110/45 -- -- 65 19 93 % --  10/26/20 1936 -- -- -- -- -- 94 % --  10/26/20 1900 (!) 99/39 -- -- 69 18 94 % --  10/26/20 1724 -- -- -- -- -- -- 91.2 kg  10/26/20 1717 (!) 121/48 -- -- 69 -- 95 % --  10/26/20 1715 -- -- -- -- -- 90 % --    12:09 AM Reevaluation with update and discussion. After initial assessment and treatment, an updated evaluation reveals she is currently comfortable and states she has no pain at this time.  She denies any urinary symptoms including bleeding, hesitancy, urgency.  Findings discussed with the patient and all questions were answered. Daleen Bo   Medical Decision Making:  This patient is presenting for evaluation of upper abdominal pain, which does require a range of treatment options, and is a complaint that involves a moderate risk of morbidity and mortality. The differential diagnoses include bowel obstruction, enteritis, nonspecific abdominal pain. I decided to review old records, and in summary elderly female with known abdominal hernia, which is reducible presenting for evaluation of abdominal pain.  She has a history of diabetes, obstructive sleep apnea, COPD, cancer and depression..  I did not require additional historical information from anyone.  Clinical Laboratory Tests Ordered, included CBC and Metabolic panel. Review indicates normal except white count high, glucose. Radiologic Tests Ordered, included CT abdomen pelvis.  I independently Visualized: CT images, which show no acute abnormalities.  Possible pancreatic abnormality is recommended for follow-up with MRI/MRCP.  This can be done electively within the next few weeks.    Critical Interventions-clinical evaluation, laboratory testing, CT imaging, IV fluid, fentanyl, Zofran, observation assessment  After These Interventions, the Patient was reevaluated and was found stable for discharge.  No source of  pain found.  No bowel obstruction or acute  intra-abdominal process.  Abnormal appearing pancreas with recommendation for outpatient MRI imaging.  CRITICAL CARE-no Performed by: Daleen Bo  Nursing Notes Reviewed/ Care Coordinated Applicable Imaging Reviewed Interpretation of Laboratory Data incorporated into ED treatment  The patient appears reasonably screened and/or stabilized for discharge and I doubt any other medical condition or other [facility] requiring further screening, evaluation, or treatment in the ED at this time prior to discharge.  Plan: Home Medications-continue usual medicines and use Tylenol for pain; Home Treatments-regular diet and activity; return here if the recommended treatment, does not improve the symptoms; Recommended follow up-PCP follow-up 1 to 2 weeks.     Final Clinical Impression(s) / ED Diagnoses Final diagnoses:  Epigastric pain    Rx / DC Orders ED Discharge Orders     None        Daleen Bo, MD 10/27/20 [phone]

## 2020-10-27 LAB — URINALYSIS, ROUTINE W REFLEX MICROSCOPIC
Bacteria, UA: NONE SEEN
Bilirubin Urine: NEGATIVE
Glucose, UA: 50 mg/dL — AB
Hgb urine dipstick: NEGATIVE
Ketones, ur: NEGATIVE mg/dL
Nitrite: NEGATIVE
Protein, ur: 30 mg/dL — AB
Specific Gravity, Urine: >1.046 — ABNORMAL HIGH (ref 1.005–1.030)
pH: 5 (ref 5.0–8.0)

## 2020-10-27 NOTE — Discharge Instructions (Addendum)
Your testing today did not show any problems that would require hospitalization or surgery at this time.  The CT scan did show a possible abnormality of the pancreas that can be evaluated further with a test called MRI/MRCP.  It is recommended that you have this done sometime within the next few weeks for further evaluation to make sure you do not have a problem with the pancreas that requires intervention.  In the meantime it is safe to go home, and continue your regular medications and diet.  You can use Tylenol if needed for pain.  Call your doctor for follow-up appointment to help arrange for further evaluation and treatment.

## 2020-10-27 NOTE — ED Notes (Signed)
Daughter called and she is on the way to come pick up the patient.

## 2020-11-06 ENCOUNTER — Telehealth: Payer: Self-pay | Admitting: Interventional Cardiology

## 2020-11-06 ENCOUNTER — Other Ambulatory Visit: Payer: Self-pay

## 2020-11-06 MED ORDER — ISOSORBIDE MONONITRATE ER 60 MG PO TB24: 60.0000 mg | ORAL_TABLET | Freq: Every day | ORAL | 0 refills | Status: DC

## 2020-11-06 NOTE — Telephone Encounter (Signed)
*  STAT* If patient is at the pharmacy, call can be transferred to refill team.   1. Which medications need to be refilled? (please list name of each medication and dose if known) isosorbide mononitrate (IMDUR) 60 MG 24 hr tablet  2. Which pharmacy/location (including street and city if local pharmacy) is medication to be sent to? [place], [address]  3. Do they need a 30 day or 90 day supply? 90 day   Patient has 1 tablet left

## 2020-11-10 NOTE — Progress Notes (Deleted)
Cardiology Office Note:    Date:  11/10/2020   ID:  [name], DOB 1941-01-04, MRN [id number]  PCP:  Aretta Nip, MD   [facility] Providers Cardiologist:  [name], MD { Click to update primary MD,subspecialty MD or APP then REFRESH:1}  ***  Referring MD: Aretta Nip, MD   Chief Complaint:  No chief complaint on file.    Patient Profile:    [name] is a 80 y.o. female with:  Coronary artery disease  Cath 2013: borderline prox-mid LAD and dist RCA disease >> Med Rx.  Hypertension  Hyperlipidemia  Diabetes mellitus Carotid artery disease [place] 8/21: R 1-39; L 40-59  OSA Venous insufficiency with stasis dermatitis  Peripheral arterial disease  Breast CA Aortic atherosclerosis    Prior CV studies: Carotid US 12/20/19 Summary: Right Carotid: Velocities in the right ICA are consistent with a 1-39% stenosis. Left Carotid: Velocities in the left ICA are consistent with a 40-59% stenosis.  Echocardiogram 07/20/2018  1. The left ventricle has normal systolic function with an ejection  fraction of 60-65%. The cavity size was normal. Left ventricular diastolic  Doppler parameters are consistent with pseudonormalization. No evidence of  left ventricular regional wall  motion abnormalities.   2. The right ventricle has normal systolic function. The cavity was  normal. There is no increase in right ventricular wall thickness. Right  ventricular systolic pressure could not be assessed.   3. Left atrial size was mildly dilated.   4. Mild thickening of the mitral valve leaflet. There is moderate mitral  annular calcification present.   5. The aortic valve is tricuspid Mild thickening of the aortic valve Mild  calcification of the aortic valve. Aortic valve regurgitation was not  assessed by color flow Doppler.. Moderate aortic annular calcification  noted.   6. The aortic root and ascending aorta are normal in size and structure.    Cardiac catheterization 07/07/11 LM patent  LAD prox to mid 50-70; Dx irregs LCx patent; OM1 ostial 50, OM2/OM3 irregs RCA dist 80 (collats from LCx) EF 60%  IMPRESSIONS:   1. Severe distal left coronary disease with borderline proximal to mid LAD disease. The distal right coronary is collateralized from the circumflex. 2. Normal left ventricular function 3. This was an extremely difficult procedure performed from the right radial approach due to vessel tortuosity and inability to control catheter torque. Should intervention becomes necessary in the future, however recommend either the left radial approach, or if at that time her femoral arteries have been revascularized, approaching from the legs   History of Present Illness: Ms. Axon was last seen by Dr. [name] in 07/2018.  She returns for f/u.  ***        Past Medical History:  Diagnosis Date   Asthma    Breast cancer ([place])    LT 1992, RT 2007   Coronary atherosclerosis    Depression    DM (diabetes mellitus) ([place])    Hyperlipidemia    Hypertension    OSA (obstructive sleep apnea) 01/20/2014   Osteopenia    Personal history of radiation therapy    PVD (peripheral vascular disease) ([place])     Current Medications: No outpatient medications have been marked as taking for the 11/11/20 encounter (Appointment) with Richardson Dopp T, PA-C.     Allergies:   Niacin, Penicillins, Saccharin, Amoxicillin, Codeine, Diclofenac sodium, Erythromycin, Glipizide, Metformin and related, Methocarbamol, Ramipril, Sulfa antibiotics, and Tetanus toxoid   Social History  Tobacco Use   Smoking status: Former    Packs/day: 1.00    Years: 40.00    Pack years: 40.00    Types: Cigarettes    Quit date: 05/10/2003    Years since quitting: 17.5   Smokeless tobacco: Never  Vaping Use   Vaping Use: Never used  Substance Use Topics   Alcohol use: No   Drug use: No     Family Hx: The patient's family history includes Heart disease in her father  and mother.  ROS   EKGs/Labs/Other Test Reviewed:    EKG:  EKG is *** ordered today.  The ekg ordered today demonstrates ***  Recent Labs: 10/26/2020: ALT 14; BUN 17; Creatinine, Ser 0.86; Hemoglobin 13.3; Platelets 340; Potassium 4.1; Sodium 133   Recent Lipid Panel No results found for: CHOL, TRIG, HDL, LDLCALC, LDLDIRECT    Risk Assessment/Calculations:   {Does this patient have ATRIAL FIBRILLATION?:[id number]}  Physical Exam:    VS:  There were no vitals taken for this visit.    Wt Readings from Last 3 Encounters:  10/26/20 201 lb (91.2 kg)  08/14/20 215 lb (97.5 kg)  07/10/18 229 lb (103.9 kg)     Physical Exam ***     ASSESSMENT & PLAN:    ***    {Are you ordering a CV Procedure (e.g. stress test, cath, DCCV, TEE, etc)?   Press F2        :[id number]}    Dispo:  No follow-ups on file.   Medication Adjustments/Labs and Tests Ordered: Current medicines are reviewed at length with the patient today.  Concerns regarding medicines are outlined above.  Tests Ordered: No orders of the defined types were placed in this encounter.  Medication Changes: No orders of the defined types were placed in this encounter.   Signed, Richardson Dopp, PA-C  11/10/2020 11:35 PM    [facility] [address], [place], [place]  [address] Phone: [phone]; Fax: [phone]

## 2020-11-11 ENCOUNTER — Ambulatory Visit: Payer: HMO | Admitting: Physician Assistant

## 2020-11-26 ENCOUNTER — Other Ambulatory Visit: Payer: HMO

## 2021-01-19 DIAGNOSIS — E1165 Type 2 diabetes mellitus with hyperglycemia: Secondary | ICD-10-CM | POA: Diagnosis not present

## 2021-02-08 ENCOUNTER — Other Ambulatory Visit: Payer: Self-pay | Admitting: Interventional Cardiology

## 2021-02-23 DIAGNOSIS — J449 Chronic obstructive pulmonary disease, unspecified: Secondary | ICD-10-CM | POA: Diagnosis not present

## 2021-02-23 DIAGNOSIS — I10 Essential (primary) hypertension: Secondary | ICD-10-CM | POA: Diagnosis not present

## 2021-02-23 DIAGNOSIS — Z111 Encounter for screening for respiratory tuberculosis: Secondary | ICD-10-CM | POA: Diagnosis not present

## 2021-02-23 DIAGNOSIS — F3341 Major depressive disorder, recurrent, in partial remission: Secondary | ICD-10-CM | POA: Diagnosis not present

## 2021-02-23 DIAGNOSIS — E782 Mixed hyperlipidemia: Secondary | ICD-10-CM | POA: Diagnosis not present

## 2021-02-23 DIAGNOSIS — E1169 Type 2 diabetes mellitus with other specified complication: Secondary | ICD-10-CM | POA: Diagnosis not present

## 2021-02-23 DIAGNOSIS — I25119 Atherosclerotic heart disease of native coronary artery with unspecified angina pectoris: Secondary | ICD-10-CM | POA: Diagnosis not present

## 2021-02-23 DIAGNOSIS — G4733 Obstructive sleep apnea (adult) (pediatric): Secondary | ICD-10-CM | POA: Diagnosis not present

## 2021-03-16 DIAGNOSIS — E782 Mixed hyperlipidemia: Secondary | ICD-10-CM | POA: Diagnosis not present

## 2021-03-16 DIAGNOSIS — E119 Type 2 diabetes mellitus without complications: Secondary | ICD-10-CM | POA: Diagnosis not present

## 2021-03-16 DIAGNOSIS — M810 Age-related osteoporosis without current pathological fracture: Secondary | ICD-10-CM | POA: Diagnosis not present

## 2021-03-16 DIAGNOSIS — J449 Chronic obstructive pulmonary disease, unspecified: Secondary | ICD-10-CM | POA: Diagnosis not present

## 2021-03-16 DIAGNOSIS — Z794 Long term (current) use of insulin: Secondary | ICD-10-CM | POA: Diagnosis not present

## 2021-03-30 DIAGNOSIS — F331 Major depressive disorder, recurrent, moderate: Secondary | ICD-10-CM | POA: Diagnosis not present

## 2021-03-30 DIAGNOSIS — F411 Generalized anxiety disorder: Secondary | ICD-10-CM | POA: Diagnosis not present

## 2021-04-13 DIAGNOSIS — E782 Mixed hyperlipidemia: Secondary | ICD-10-CM | POA: Diagnosis not present

## 2021-04-13 DIAGNOSIS — J449 Chronic obstructive pulmonary disease, unspecified: Secondary | ICD-10-CM | POA: Diagnosis not present

## 2021-04-13 DIAGNOSIS — I129 Hypertensive chronic kidney disease with stage 1 through stage 4 chronic kidney disease, or unspecified chronic kidney disease: Secondary | ICD-10-CM | POA: Diagnosis not present

## 2021-04-13 DIAGNOSIS — E1122 Type 2 diabetes mellitus with diabetic chronic kidney disease: Secondary | ICD-10-CM | POA: Diagnosis not present

## 2021-04-13 DIAGNOSIS — N182 Chronic kidney disease, stage 2 (mild): Secondary | ICD-10-CM | POA: Diagnosis not present

## 2021-04-13 DIAGNOSIS — M810 Age-related osteoporosis without current pathological fracture: Secondary | ICD-10-CM | POA: Diagnosis not present

## 2021-04-27 DIAGNOSIS — F411 Generalized anxiety disorder: Secondary | ICD-10-CM | POA: Diagnosis not present

## 2021-04-27 DIAGNOSIS — F331 Major depressive disorder, recurrent, moderate: Secondary | ICD-10-CM | POA: Diagnosis not present

## 2021-05-06 DIAGNOSIS — E1165 Type 2 diabetes mellitus with hyperglycemia: Secondary | ICD-10-CM | POA: Diagnosis not present

## 2021-05-08 ENCOUNTER — Other Ambulatory Visit: Payer: Self-pay | Admitting: Interventional Cardiology

## 2021-05-11 DIAGNOSIS — I129 Hypertensive chronic kidney disease with stage 1 through stage 4 chronic kidney disease, or unspecified chronic kidney disease: Secondary | ICD-10-CM | POA: Diagnosis not present

## 2021-05-11 DIAGNOSIS — J449 Chronic obstructive pulmonary disease, unspecified: Secondary | ICD-10-CM | POA: Diagnosis not present

## 2021-05-11 DIAGNOSIS — E782 Mixed hyperlipidemia: Secondary | ICD-10-CM | POA: Diagnosis not present

## 2021-05-11 DIAGNOSIS — E1122 Type 2 diabetes mellitus with diabetic chronic kidney disease: Secondary | ICD-10-CM | POA: Diagnosis not present

## 2021-05-11 DIAGNOSIS — N182 Chronic kidney disease, stage 2 (mild): Secondary | ICD-10-CM | POA: Diagnosis not present

## 2021-05-11 DIAGNOSIS — M810 Age-related osteoporosis without current pathological fracture: Secondary | ICD-10-CM | POA: Diagnosis not present

## 2021-06-07 DIAGNOSIS — I10 Essential (primary) hypertension: Secondary | ICD-10-CM | POA: Diagnosis not present

## 2021-06-07 DIAGNOSIS — M810 Age-related osteoporosis without current pathological fracture: Secondary | ICD-10-CM | POA: Diagnosis not present

## 2021-06-08 DIAGNOSIS — J449 Chronic obstructive pulmonary disease, unspecified: Secondary | ICD-10-CM | POA: Diagnosis not present

## 2021-06-08 DIAGNOSIS — E782 Mixed hyperlipidemia: Secondary | ICD-10-CM | POA: Diagnosis not present

## 2021-06-08 DIAGNOSIS — M810 Age-related osteoporosis without current pathological fracture: Secondary | ICD-10-CM | POA: Diagnosis not present

## 2021-06-08 DIAGNOSIS — F411 Generalized anxiety disorder: Secondary | ICD-10-CM | POA: Diagnosis not present

## 2021-06-08 DIAGNOSIS — I129 Hypertensive chronic kidney disease with stage 1 through stage 4 chronic kidney disease, or unspecified chronic kidney disease: Secondary | ICD-10-CM | POA: Diagnosis not present

## 2021-06-08 DIAGNOSIS — E1122 Type 2 diabetes mellitus with diabetic chronic kidney disease: Secondary | ICD-10-CM | POA: Diagnosis not present

## 2021-06-08 DIAGNOSIS — F331 Major depressive disorder, recurrent, moderate: Secondary | ICD-10-CM | POA: Diagnosis not present

## 2021-06-08 DIAGNOSIS — N182 Chronic kidney disease, stage 2 (mild): Secondary | ICD-10-CM | POA: Diagnosis not present

## 2021-06-17 DIAGNOSIS — E119 Type 2 diabetes mellitus without complications: Secondary | ICD-10-CM | POA: Diagnosis not present

## 2021-06-17 DIAGNOSIS — E782 Mixed hyperlipidemia: Secondary | ICD-10-CM | POA: Diagnosis not present

## 2021-06-17 DIAGNOSIS — I2510 Atherosclerotic heart disease of native coronary artery without angina pectoris: Secondary | ICD-10-CM | POA: Diagnosis not present

## 2021-06-17 DIAGNOSIS — F331 Major depressive disorder, recurrent, moderate: Secondary | ICD-10-CM | POA: Diagnosis not present

## 2021-06-17 DIAGNOSIS — J302 Other seasonal allergic rhinitis: Secondary | ICD-10-CM | POA: Diagnosis not present

## 2021-06-17 DIAGNOSIS — I10 Essential (primary) hypertension: Secondary | ICD-10-CM | POA: Diagnosis not present

## 2021-07-06 DIAGNOSIS — J302 Other seasonal allergic rhinitis: Secondary | ICD-10-CM | POA: Diagnosis not present

## 2021-07-06 DIAGNOSIS — E782 Mixed hyperlipidemia: Secondary | ICD-10-CM | POA: Diagnosis not present

## 2021-07-06 DIAGNOSIS — I129 Hypertensive chronic kidney disease with stage 1 through stage 4 chronic kidney disease, or unspecified chronic kidney disease: Secondary | ICD-10-CM | POA: Diagnosis not present

## 2021-07-06 DIAGNOSIS — E1122 Type 2 diabetes mellitus with diabetic chronic kidney disease: Secondary | ICD-10-CM | POA: Diagnosis not present

## 2021-07-06 DIAGNOSIS — N182 Chronic kidney disease, stage 2 (mild): Secondary | ICD-10-CM | POA: Diagnosis not present

## 2021-07-13 DIAGNOSIS — F331 Major depressive disorder, recurrent, moderate: Secondary | ICD-10-CM | POA: Diagnosis not present

## 2021-07-13 DIAGNOSIS — F411 Generalized anxiety disorder: Secondary | ICD-10-CM | POA: Diagnosis not present

## 2021-08-24 DIAGNOSIS — E1122 Type 2 diabetes mellitus with diabetic chronic kidney disease: Secondary | ICD-10-CM | POA: Diagnosis not present

## 2021-08-24 DIAGNOSIS — E782 Mixed hyperlipidemia: Secondary | ICD-10-CM | POA: Diagnosis not present

## 2021-08-24 DIAGNOSIS — F331 Major depressive disorder, recurrent, moderate: Secondary | ICD-10-CM | POA: Diagnosis not present

## 2021-08-24 DIAGNOSIS — I129 Hypertensive chronic kidney disease with stage 1 through stage 4 chronic kidney disease, or unspecified chronic kidney disease: Secondary | ICD-10-CM | POA: Diagnosis not present

## 2021-08-24 DIAGNOSIS — F411 Generalized anxiety disorder: Secondary | ICD-10-CM | POA: Diagnosis not present

## 2021-08-24 DIAGNOSIS — N182 Chronic kidney disease, stage 2 (mild): Secondary | ICD-10-CM | POA: Diagnosis not present

## 2021-08-24 DIAGNOSIS — J302 Other seasonal allergic rhinitis: Secondary | ICD-10-CM | POA: Diagnosis not present

## 2021-08-31 DIAGNOSIS — F329 Major depressive disorder, single episode, unspecified: Secondary | ICD-10-CM | POA: Diagnosis not present

## 2021-08-31 DIAGNOSIS — F419 Anxiety disorder, unspecified: Secondary | ICD-10-CM | POA: Diagnosis not present

## 2021-09-21 DIAGNOSIS — F331 Major depressive disorder, recurrent, moderate: Secondary | ICD-10-CM | POA: Diagnosis not present

## 2021-09-21 DIAGNOSIS — F411 Generalized anxiety disorder: Secondary | ICD-10-CM | POA: Diagnosis not present

## 2021-09-28 DIAGNOSIS — I129 Hypertensive chronic kidney disease with stage 1 through stage 4 chronic kidney disease, or unspecified chronic kidney disease: Secondary | ICD-10-CM | POA: Diagnosis not present

## 2021-09-28 DIAGNOSIS — E1122 Type 2 diabetes mellitus with diabetic chronic kidney disease: Secondary | ICD-10-CM | POA: Diagnosis not present

## 2021-09-28 DIAGNOSIS — N182 Chronic kidney disease, stage 2 (mild): Secondary | ICD-10-CM | POA: Diagnosis not present

## 2021-09-28 DIAGNOSIS — J449 Chronic obstructive pulmonary disease, unspecified: Secondary | ICD-10-CM | POA: Diagnosis not present

## 2021-09-28 DIAGNOSIS — J302 Other seasonal allergic rhinitis: Secondary | ICD-10-CM | POA: Diagnosis not present

## 2021-09-28 DIAGNOSIS — E782 Mixed hyperlipidemia: Secondary | ICD-10-CM | POA: Diagnosis not present

## 2021-09-28 IMAGING — MG DIGITAL DIAGNOSTIC BILAT W/ TOMO W/ CAD
8 of 16 series · 8 of 40 positions shown · non-contrast
Comparison: Previous exam(s).

CLINICAL DATA: 79-year-old female presenting with a new lump in the
left breast. Patient has a history of remote bilateral breast cancer
status post lumpectomy. Additionally she reports and interval weight
loss of 30 pound since the prior mammogram.

EXAM:
DIGITAL DIAGNOSTIC BILATERAL MAMMOGRAM WITH TOMOSYNTHESIS AND CAD;
ULTRASOUND LEFT BREAST LIMITED
TECHNIQUE: Bilateral digital diagnostic mammography and breast tomosynthesis
was performed. The images were evaluated with computer-aided
detection.; Targeted ultrasound examination of the left breast was
performed

[L XCCL synth-2D]
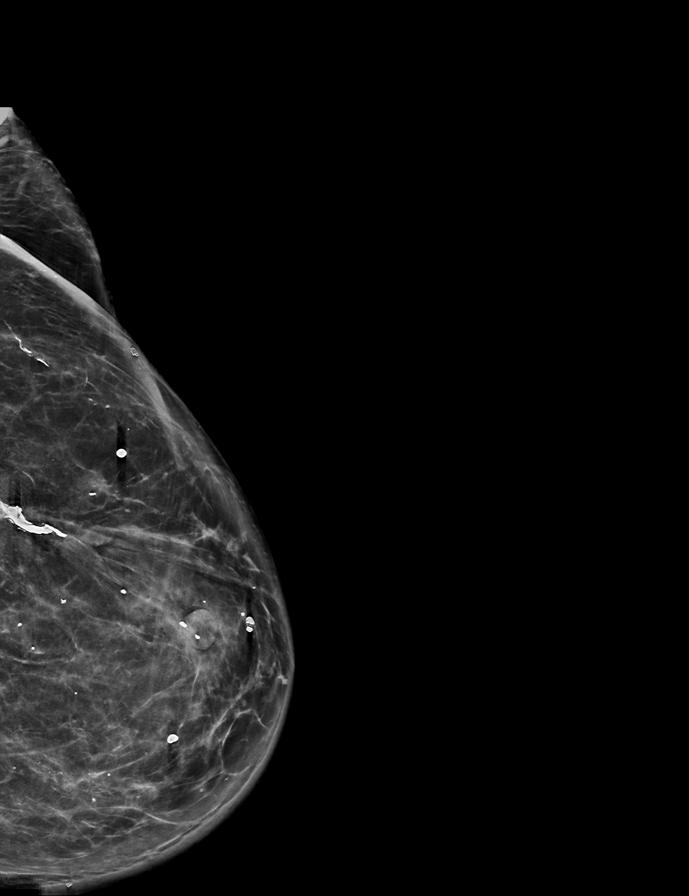

[R MLO synth-2D]
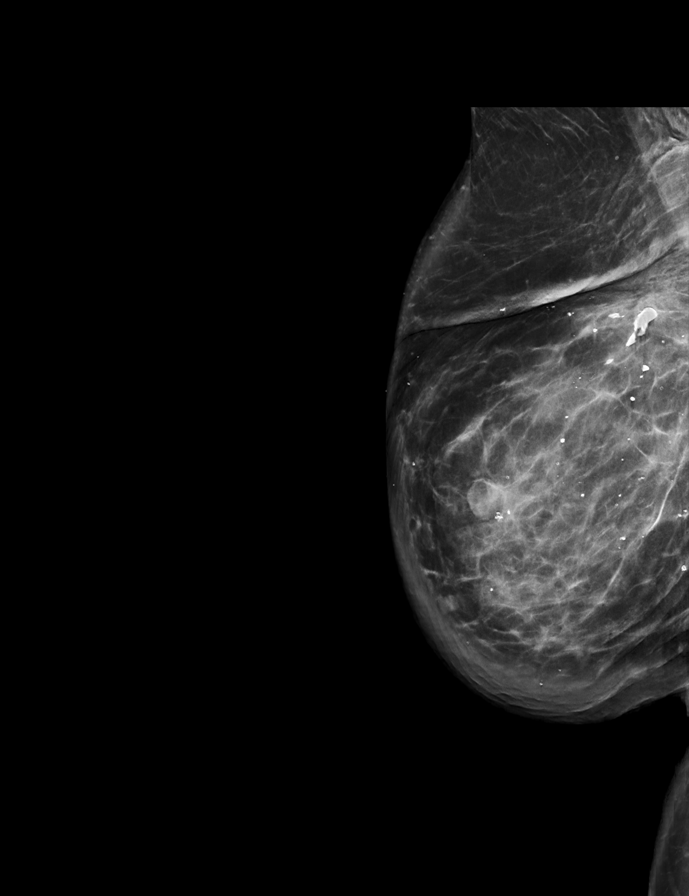

[L ML synth-2D]
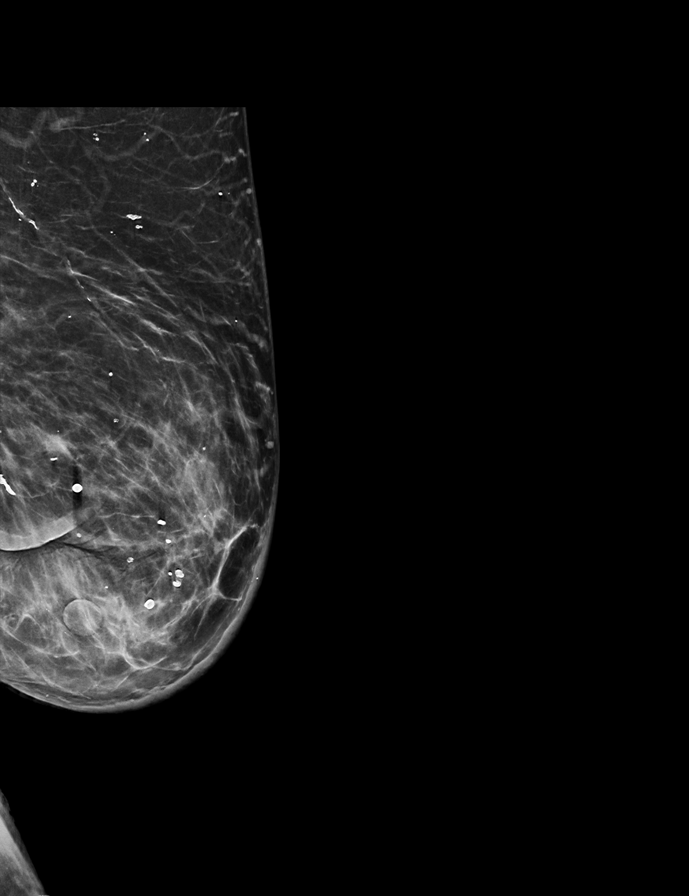

[L CC synth-2D (1 of 3)]
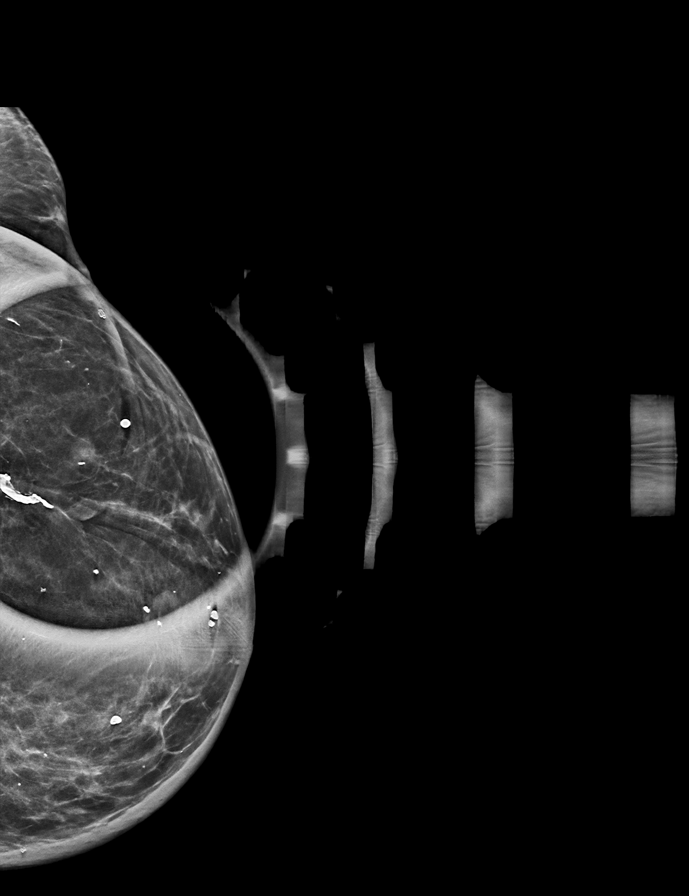

[L CC synth-2D (2 of 3)]
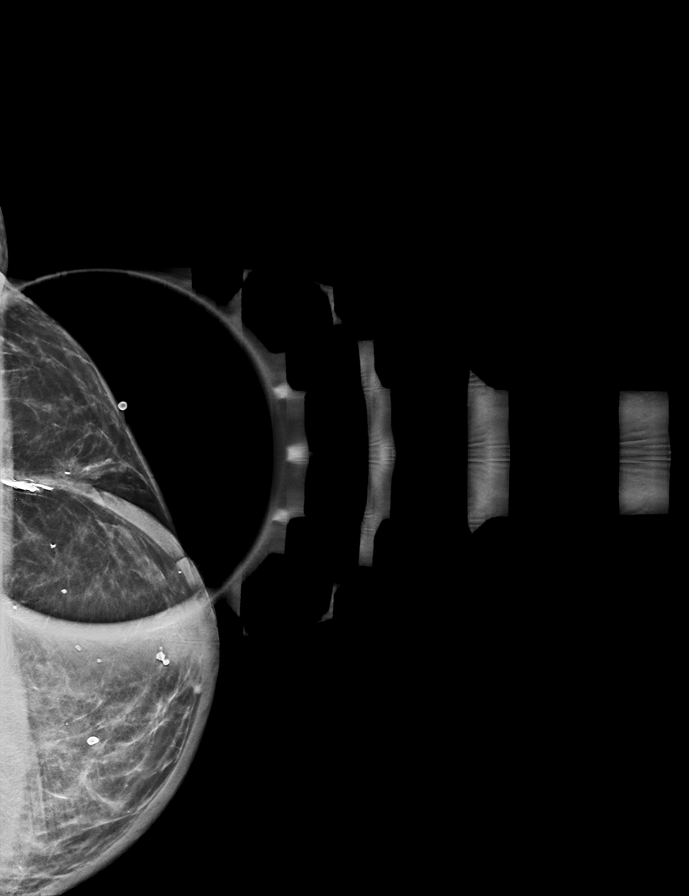

[R XCCL synth-2D]
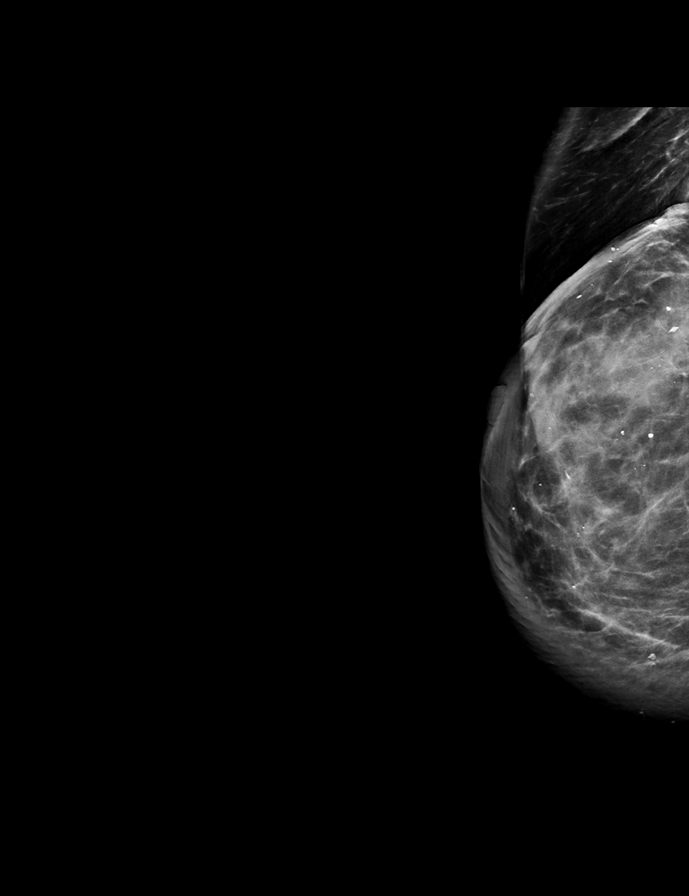

[L CC synth-2D (3 of 3)]
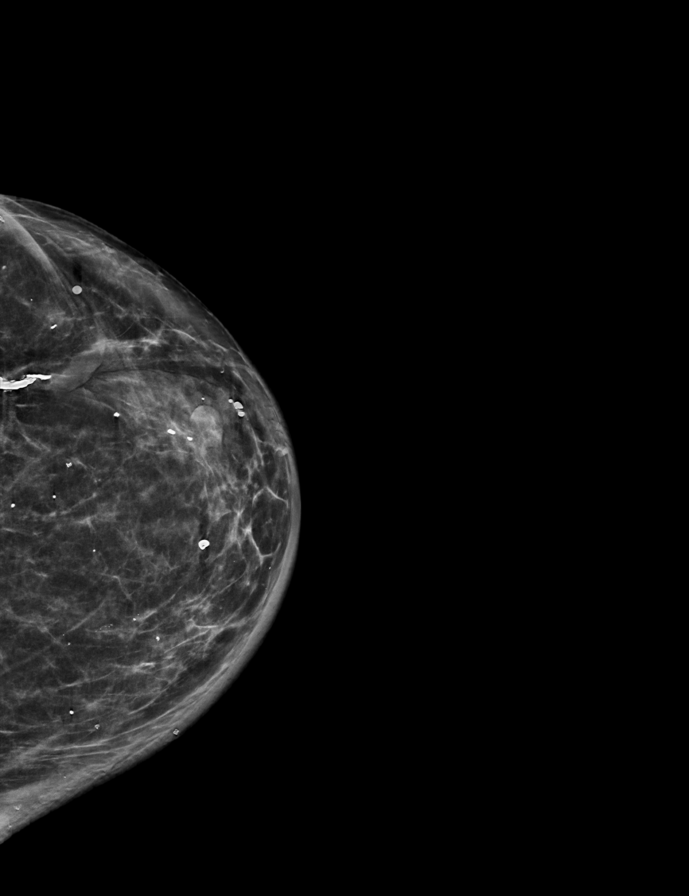

[R MLO tomo · tomo slice 53/106.0]
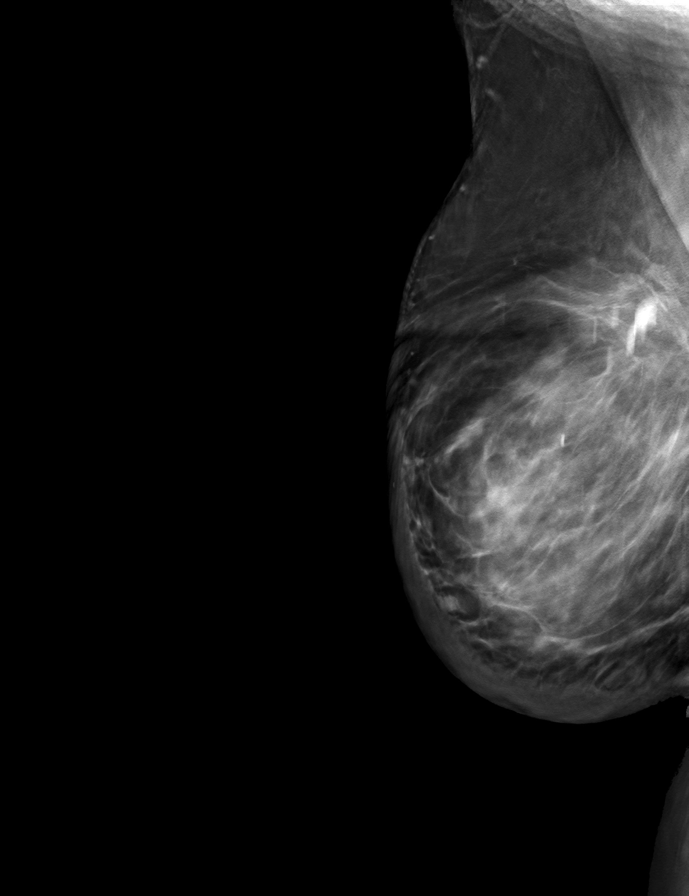

[8 of 40 positions shown; findings below may reference images not displayed]

ACR Breast Density Category c: The breast tissue is heterogeneously
dense, which may obscure small masses.
FINDINGS: Mammogram:

Right breast: There is diffuse trabeculation and skin thickening,
similar to the prior study, compatible with post treatment changes.
No new abnormality to suggest the presence of malignancy.

Left breast: A skin BB marks the site of concern reported by the
patient in the outer left breast. Spot compression tomosynthesis
views of this area were performed in addition to standard views.
There is a subtle mass at the palpable site best seen on the spot cc
view measuring approximately 0.6 cm.

Ultrasound:

Targeted ultrasound performed at the site of concern reported by the
patient in the left breast at 3 o'clock 4 cm from the nipple
demonstrating an irregular hypoechoic mass with echogenic halo
measuring 0.5 x 0.5 x 0.6 cm. No internal vascularity. Targeted
ultrasound the left axilla demonstrates normal lymph nodes.
IMPRESSION: Indeterminate 0.6 cm mass at 3 o'clock in the left breast at the
site of concern reported by the patient.

RECOMMENDATION:
Ultrasound-guided core needle biopsy of the left breast mass at 3
o'clock.

I have discussed the findings and recommendations with the patient.
If applicable, a reminder letter will be sent to the patient
regarding the next appointment.

BI-RADS CATEGORY  4: Suspicious.

## 2021-09-29 ENCOUNTER — Emergency Department (HOSPITAL_COMMUNITY): Payer: HMO

## 2021-09-29 ENCOUNTER — Inpatient Hospital Stay (HOSPITAL_COMMUNITY)
Admission: EM | Admit: 2021-09-29 | Discharge: 2021-10-03 | DRG: 291 | Disposition: A | Discharge status: Skilled Nursing Facility | Payer: HMO | Attending: Internal Medicine | Admitting: Internal Medicine

## 2021-09-29 ENCOUNTER — Other Ambulatory Visit: Payer: Self-pay

## 2021-09-29 ENCOUNTER — Encounter (HOSPITAL_COMMUNITY): Payer: Self-pay

## 2021-09-29 DIAGNOSIS — Z515 Encounter for palliative care: Secondary | ICD-10-CM

## 2021-09-29 DIAGNOSIS — R0902 Hypoxemia: Secondary | ICD-10-CM | POA: Diagnosis not present

## 2021-09-29 DIAGNOSIS — N179 Acute kidney failure, unspecified: Secondary | ICD-10-CM | POA: Diagnosis present

## 2021-09-29 DIAGNOSIS — I10 Essential (primary) hypertension: Secondary | ICD-10-CM | POA: Diagnosis not present

## 2021-09-29 DIAGNOSIS — Z20822 Contact with and (suspected) exposure to covid-19: Secondary | ICD-10-CM | POA: Diagnosis present

## 2021-09-29 DIAGNOSIS — R739 Hyperglycemia, unspecified: Secondary | ICD-10-CM | POA: Diagnosis not present

## 2021-09-29 DIAGNOSIS — R54 Age-related physical debility: Secondary | ICD-10-CM | POA: Diagnosis present

## 2021-09-29 DIAGNOSIS — E1151 Type 2 diabetes mellitus with diabetic peripheral angiopathy without gangrene: Secondary | ICD-10-CM | POA: Diagnosis present

## 2021-09-29 DIAGNOSIS — Z9981 Dependence on supplemental oxygen: Secondary | ICD-10-CM | POA: Diagnosis not present

## 2021-09-29 DIAGNOSIS — J449 Chronic obstructive pulmonary disease, unspecified: Secondary | ICD-10-CM | POA: Diagnosis present

## 2021-09-29 DIAGNOSIS — I5033 Acute on chronic diastolic (congestive) heart failure: Secondary | ICD-10-CM | POA: Diagnosis present

## 2021-09-29 DIAGNOSIS — Z79899 Other long term (current) drug therapy: Secondary | ICD-10-CM

## 2021-09-29 DIAGNOSIS — I2510 Atherosclerotic heart disease of native coronary artery without angina pectoris: Secondary | ICD-10-CM | POA: Diagnosis present

## 2021-09-29 DIAGNOSIS — Z888 Allergy status to other drugs, medicaments and biological substances status: Secondary | ICD-10-CM

## 2021-09-29 DIAGNOSIS — Z7982 Long term (current) use of aspirin: Secondary | ICD-10-CM

## 2021-09-29 DIAGNOSIS — E785 Hyperlipidemia, unspecified: Secondary | ICD-10-CM | POA: Diagnosis present

## 2021-09-29 DIAGNOSIS — I739 Peripheral vascular disease, unspecified: Secondary | ICD-10-CM | POA: Diagnosis present

## 2021-09-29 DIAGNOSIS — F32A Depression, unspecified: Secondary | ICD-10-CM | POA: Diagnosis present

## 2021-09-29 DIAGNOSIS — R079 Chest pain, unspecified: Secondary | ICD-10-CM

## 2021-09-29 DIAGNOSIS — Z882 Allergy status to sulfonamides status: Secondary | ICD-10-CM

## 2021-09-29 DIAGNOSIS — K439 Ventral hernia without obstruction or gangrene: Secondary | ICD-10-CM | POA: Diagnosis present

## 2021-09-29 DIAGNOSIS — R0602 Shortness of breath: Secondary | ICD-10-CM | POA: Diagnosis present

## 2021-09-29 DIAGNOSIS — Z794 Long term (current) use of insulin: Secondary | ICD-10-CM | POA: Diagnosis not present

## 2021-09-29 DIAGNOSIS — Z87891 Personal history of nicotine dependence: Secondary | ICD-10-CM

## 2021-09-29 DIAGNOSIS — Z880 Allergy status to penicillin: Secondary | ICD-10-CM | POA: Diagnosis not present

## 2021-09-29 DIAGNOSIS — Z853 Personal history of malignant neoplasm of breast: Secondary | ICD-10-CM | POA: Diagnosis not present

## 2021-09-29 DIAGNOSIS — I110 Hypertensive heart disease with heart failure: Secondary | ICD-10-CM | POA: Diagnosis not present

## 2021-09-29 DIAGNOSIS — E669 Obesity, unspecified: Secondary | ICD-10-CM | POA: Diagnosis present

## 2021-09-29 DIAGNOSIS — I509 Heart failure, unspecified: Secondary | ICD-10-CM | POA: Diagnosis not present

## 2021-09-29 DIAGNOSIS — E1165 Type 2 diabetes mellitus with hyperglycemia: Secondary | ICD-10-CM | POA: Diagnosis present

## 2021-09-29 DIAGNOSIS — Z923 Personal history of irradiation: Secondary | ICD-10-CM

## 2021-09-29 DIAGNOSIS — I5031 Acute diastolic (congestive) heart failure: Secondary | ICD-10-CM | POA: Diagnosis not present

## 2021-09-29 DIAGNOSIS — Z91040 Latex allergy status: Secondary | ICD-10-CM

## 2021-09-29 DIAGNOSIS — G4733 Obstructive sleep apnea (adult) (pediatric): Secondary | ICD-10-CM | POA: Diagnosis present

## 2021-09-29 DIAGNOSIS — E119 Type 2 diabetes mellitus without complications: Secondary | ICD-10-CM

## 2021-09-29 DIAGNOSIS — K469 Unspecified abdominal hernia without obstruction or gangrene: Secondary | ICD-10-CM

## 2021-09-29 DIAGNOSIS — N61 Mastitis without abscess: Secondary | ICD-10-CM | POA: Diagnosis present

## 2021-09-29 DIAGNOSIS — I499 Cardiac arrhythmia, unspecified: Secondary | ICD-10-CM | POA: Diagnosis not present

## 2021-09-29 DIAGNOSIS — J9601 Acute respiratory failure with hypoxia: Secondary | ICD-10-CM | POA: Diagnosis present

## 2021-09-29 DIAGNOSIS — I700 Atherosclerosis of aorta: Secondary | ICD-10-CM | POA: Diagnosis present

## 2021-09-29 DIAGNOSIS — N6310 Unspecified lump in the right breast, unspecified quadrant: Secondary | ICD-10-CM

## 2021-09-29 DIAGNOSIS — R0609 Other forms of dyspnea: Secondary | ICD-10-CM | POA: Diagnosis not present

## 2021-09-29 DIAGNOSIS — Z7189 Other specified counseling: Secondary | ICD-10-CM | POA: Diagnosis not present

## 2021-09-29 DIAGNOSIS — R928 Other abnormal and inconclusive findings on diagnostic imaging of breast: Secondary | ICD-10-CM

## 2021-09-29 DIAGNOSIS — R000 Tachycardia, unspecified: Secondary | ICD-10-CM | POA: Diagnosis not present

## 2021-09-29 DIAGNOSIS — Z8249 Family history of ischemic heart disease and other diseases of the circulatory system: Secondary | ICD-10-CM

## 2021-09-29 DIAGNOSIS — Z91041 Radiographic dye allergy status: Secondary | ICD-10-CM

## 2021-09-29 LAB — CBC WITH DIFFERENTIAL/PLATELET
Abs Immature Granulocytes: 0.04 10*3/uL (ref 0.00–0.07)
Basophils Absolute: 0.1 10*3/uL (ref 0.0–0.1)
Basophils Relative: 1 %
Eosinophils Absolute: 0.6 10*3/uL — ABNORMAL HIGH (ref 0.0–0.5)
Eosinophils Relative: 4 %
HCT: 43.3 % (ref 36.0–46.0)
Hemoglobin: 14 g/dL (ref 12.0–15.0)
Immature Granulocytes: 0 %
Lymphocytes Relative: 28 %
Lymphs Abs: 3.5 10*3/uL (ref 0.7–4.0)
MCH: 29 pg (ref 26.0–34.0)
MCHC: 32.3 g/dL (ref 30.0–36.0)
MCV: 89.6 fL (ref 80.0–100.0)
Monocytes Absolute: 0.8 10*3/uL (ref 0.1–1.0)
Monocytes Relative: 7 %
Neutro Abs: 7.4 10*3/uL (ref 1.7–7.7)
Neutrophils Relative %: 60 %
Platelets: 328 10*3/uL (ref 150–400)
RBC: 4.83 MIL/uL (ref 3.87–5.11)
RDW: 13.4 % (ref 11.5–15.5)
WBC: 12.4 10*3/uL — ABNORMAL HIGH (ref 4.0–10.5)
nRBC: 0 % (ref 0.0–0.2)

## 2021-09-29 LAB — BASIC METABOLIC PANEL
Anion gap: 4 — ABNORMAL LOW (ref 5–15)
BUN: 19 mg/dL (ref 8–23)
CO2: 24 mmol/L (ref 22–32)
Calcium: 9.2 mg/dL (ref 8.9–10.3)
Chloride: 107 mmol/L (ref 98–111)
Creatinine, Ser: 0.93 mg/dL (ref 0.44–1.00)
GFR, Estimated: >60 mL/min (ref >60)
Glucose, Bld: 262 mg/dL — ABNORMAL HIGH (ref 70–99)
Potassium: 5.8 mmol/L — ABNORMAL HIGH (ref 3.5–5.1)
Sodium: 135 mmol/L (ref 135–145)

## 2021-09-29 LAB — TROPONIN I (HIGH SENSITIVITY): Troponin I (High Sensitivity): 8 ng/L (ref <18)

## 2021-09-29 LAB — BRAIN NATRIURETIC PEPTIDE: B Natriuretic Peptide: 212.6 pg/mL — ABNORMAL HIGH (ref 0.0–100.0)

## 2021-09-29 LAB — RESP PANEL BY RT-PCR (FLU A&B, COVID) ARPGX2
Influenza A by PCR: NEGATIVE
Influenza B by PCR: NEGATIVE
SARS Coronavirus 2 by RT PCR: NEGATIVE

## 2021-09-29 MED ORDER — SODIUM CHLORIDE 0.9 % IV SOLN: INTRAVENOUS | Status: DC

## 2021-09-29 MED ORDER — FUROSEMIDE 10 MG/ML IJ SOLN
40.0000 mg | Freq: Once | INTRAMUSCULAR | Status: AC
  Administered 2021-09-30: 40 mg via INTRAVENOUS
  Filled 2021-09-29: qty 4

## 2021-09-29 MED ORDER — IOHEXOL 300 MG/ML  SOLN
50.0000 mL | Freq: Once | INTRAMUSCULAR | Status: AC | PRN
  Administered 2021-09-29: 50 mL via INTRAVENOUS

## 2021-09-29 NOTE — ED Notes (Signed)
Patient transported to CT 

## 2021-09-29 NOTE — ED Triage Notes (Signed)
Pt arrives via [facility] from [place] for shob with exertion that has been progressively getting worse x1 week. Pt reports today she was going to get a package and bring it inside when she had a chest pressure, relief w/ resting. Pt denies pain now, only wears O2 at night. EMS reported pt was shob w/ taking 5 steps. 94% RA, HR 120, 174/90, CBG 311

## 2021-09-29 NOTE — ED Provider Notes (Signed)
[facility] EMERGENCY DEPARTMENT Provider Note   CSN: [id number] Arrival date & time: 09/29/21  1938     History  Chief Complaint  Patient presents with   Shortness of Breath    [name] is a 81 y.o. female.  81 year old female who presents with shortness of breath which began today.  Patient noted increased dyspnea on exertion.  Has some chest pressure associated with this.  States that symptoms might be going on for about a week.  Denies any sensation of palpations.  No prior history of CHF.  Denies any fever or chills.  Slight cough but no nasal congestion.  She is currently pain-free.  Called EMS and was transported here.      Home Medications Prior to Admission medications   Medication Sig Start Date End Date Taking? Authorizing Provider  acetaminophen (TYLENOL) 500 MG tablet Take 500-1,000 mg by mouth every 6 (six) hours as needed (pain).    [provider]  albuterol (PROAIR HFA) 108 (90 Base) MCG/ACT inhaler Inhale 1-2 puffs into the lungs every 6 (six) hours as needed for wheezing or shortness of breath. 06/27/19   Parrett, Fonnie Mu, NP  aspirin EC 81 MG tablet Take 81 mg by mouth every morning. Swallow whole.    [provider]  Cholecalciferol (VITAMIN D) 50 MCG (2000 UT) tablet Take 2,000 Units by mouth every morning.    [provider]  insulin isophane & regular human (NOVOLIN 70/30 FLEXPEN) (70-30) 100 UNIT/ML KwikPen Inject 5-25 Units into the skin See admin instructions. Inject 25 units subcutaneously before breakfast, 5 units before lunch and 25 units before supper    [provider]  irbesartan (AVAPRO) 75 MG tablet Take 1 tablet (75 mg total) by mouth daily. Patient taking differently: Take 75 mg by mouth every morning. 03/28/16   Tanda Rockers, MD  isosorbide mononitrate (IMDUR) 60 MG 24 hr tablet TAKE 1 TABLET BY MOUTH ONCE DAILY --PLEASE  KEEP  [facility]  APPT  FOR  FURTHER  REFILLS 05/11/21   Belva Crome, MD  OXYGEN Inhale 2 L into the lungs See admin instructions. Use whenever sleeping - naps and overnight    [provider]  rosuvastatin (CRESTOR) 40 MG tablet Take 40 mg by mouth at bedtime.    [provider]  sertraline (ZOLOFT) 100 MG tablet Take 100 mg by mouth every morning.    [provider]      Allergies    Niacin, Penicillins, Saccharin, Amoxicillin, Clindamycin/lincomycin, Codeine, Diclofenac sodium, Erythromycin, Glipizide, Iodine, Latex, Macrolides and ketolides, Metformin and related, Methocarbamol, Ramipril, Sulfa antibiotics, and Tetanus toxoid    Review of Systems   Review of Systems  All other systems reviewed and are negative.  Physical Exam Updated Vital Signs BP 138/79   Pulse (!) 126   Temp (!) 97.4 F (36.3 C) (Oral)   Resp 19   SpO2 93%  Physical Exam Vitals and nursing note reviewed.  Constitutional:      General: She is not in acute distress.    Appearance: Normal appearance. She is well-developed. She is not toxic-appearing.  HENT:     Head: Normocephalic and atraumatic.  Eyes:     General: Lids are normal.     Conjunctiva/sclera: Conjunctivae normal.     Pupils: Pupils are equal, round, and reactive to light.  Neck:     Thyroid: No thyroid mass.     Trachea: No tracheal deviation.  Cardiovascular:  Rate and Rhythm: Normal rate and regular rhythm.     Heart sounds: Normal heart sounds. No murmur heard.   No gallop.  Pulmonary:     Effort: Pulmonary effort is normal. No respiratory distress.     Breath sounds: No stridor. Examination of the right-lower field reveals decreased breath sounds. Examination of the left-lower field reveals decreased breath sounds. Decreased breath sounds present. No wheezing, rhonchi or rales.  Abdominal:     General: There is no distension.     Palpations: Abdomen is soft.     Tenderness: There is no abdominal tenderness. There is no rebound.  Musculoskeletal:        General:  No tenderness. Normal range of motion.     Cervical back: Normal range of motion and neck supple.  Lymphadenopathy:     Comments: Bilateral 2+ pitting edema  Skin:    General: Skin is warm and dry.     Findings: No abrasion or rash.  Neurological:     Mental Status: She is alert and oriented to person, place, and time. Mental status is at baseline.     GCS: GCS eye subscore is 4. GCS verbal subscore is 5. GCS motor subscore is 6.     Cranial Nerves: No cranial nerve deficit.     Sensory: No sensory deficit.     Motor: Motor function is intact.  Psychiatric:        Attention and Perception: Attention normal.        Speech: Speech normal.        Behavior: Behavior normal.    ED Results / Procedures / Treatments   Labs (all labs ordered are listed, but only abnormal results are displayed) Labs Reviewed  RESP PANEL BY RT-PCR (FLU A&B, COVID) ARPGX2  BRAIN NATRIURETIC PEPTIDE  CBC WITH DIFFERENTIAL/PLATELET  BASIC METABOLIC PANEL  TROPONIN I (HIGH SENSITIVITY)    EKG EKG Interpretation  Date/Time:  Wednesday Sep 29 2021 19:42:50 EDT Ventricular Rate:  125 PR Interval:    QRS Duration: 88 QT Interval:  309 QTC Calculation: 446 R Axis:   36 Text Interpretation: Atrial fibrillation Repol abnrm suggests ischemia, diffuse leads Confirmed by Lacretia Leigh ([id number]) on 09/29/2021 8:01:58 PM  Radiology No results found.  Procedures Procedures    Medications Ordered in ED Medications  0.9 %  sodium chloride infusion (has no administration in time range)    ED Course/ Medical Decision Making/ A&P                           Medical Decision Making Amount and/or Complexity of Data Reviewed Labs: ordered. Radiology: ordered.  Risk Prescription drug management. Decision regarding hospitalization.   Patient's EKG per my interpretation shows A-fib.  No signs of acute coronary ischemia.  Chest x-ray concerning for possible CHF.  Patient given Lasix here.  Concern for  possible PE and chest CT from interpretation shows no evidence of pulmonary embolus.  Patient is requiring oxygen.  Plan will be for patient to the hospitalist service        Final Clinical Impression(s) / ED Diagnoses Final diagnoses:  None    Rx / DC Orders ED Discharge Orders     None         Lacretia Leigh, MD 10/11/21 [phone]

## 2021-09-30 ENCOUNTER — Encounter (HOSPITAL_COMMUNITY): Payer: Self-pay | Admitting: Internal Medicine

## 2021-09-30 ENCOUNTER — Observation Stay (HOSPITAL_BASED_OUTPATIENT_CLINIC_OR_DEPARTMENT_OTHER): Payer: HMO

## 2021-09-30 DIAGNOSIS — Z515 Encounter for palliative care: Secondary | ICD-10-CM

## 2021-09-30 DIAGNOSIS — I2510 Atherosclerotic heart disease of native coronary artery without angina pectoris: Secondary | ICD-10-CM | POA: Diagnosis present

## 2021-09-30 DIAGNOSIS — E1151 Type 2 diabetes mellitus with diabetic peripheral angiopathy without gangrene: Secondary | ICD-10-CM | POA: Diagnosis present

## 2021-09-30 DIAGNOSIS — Z888 Allergy status to other drugs, medicaments and biological substances status: Secondary | ICD-10-CM | POA: Diagnosis not present

## 2021-09-30 DIAGNOSIS — R0609 Other forms of dyspnea: Secondary | ICD-10-CM | POA: Diagnosis not present

## 2021-09-30 DIAGNOSIS — E785 Hyperlipidemia, unspecified: Secondary | ICD-10-CM | POA: Diagnosis present

## 2021-09-30 DIAGNOSIS — Z20822 Contact with and (suspected) exposure to covid-19: Secondary | ICD-10-CM | POA: Diagnosis not present

## 2021-09-30 DIAGNOSIS — Z7189 Other specified counseling: Secondary | ICD-10-CM

## 2021-09-30 DIAGNOSIS — I110 Hypertensive heart disease with heart failure: Secondary | ICD-10-CM | POA: Diagnosis not present

## 2021-09-30 DIAGNOSIS — Z794 Long term (current) use of insulin: Secondary | ICD-10-CM | POA: Diagnosis not present

## 2021-09-30 DIAGNOSIS — N61 Mastitis without abscess: Secondary | ICD-10-CM | POA: Diagnosis present

## 2021-09-30 DIAGNOSIS — R928 Other abnormal and inconclusive findings on diagnostic imaging of breast: Secondary | ICD-10-CM

## 2021-09-30 DIAGNOSIS — Z882 Allergy status to sulfonamides status: Secondary | ICD-10-CM | POA: Diagnosis not present

## 2021-09-30 DIAGNOSIS — Z853 Personal history of malignant neoplasm of breast: Secondary | ICD-10-CM | POA: Diagnosis not present

## 2021-09-30 DIAGNOSIS — E1165 Type 2 diabetes mellitus with hyperglycemia: Secondary | ICD-10-CM | POA: Diagnosis not present

## 2021-09-30 DIAGNOSIS — J9601 Acute respiratory failure with hypoxia: Secondary | ICD-10-CM | POA: Diagnosis not present

## 2021-09-30 DIAGNOSIS — K439 Ventral hernia without obstruction or gangrene: Secondary | ICD-10-CM | POA: Diagnosis present

## 2021-09-30 DIAGNOSIS — E669 Obesity, unspecified: Secondary | ICD-10-CM | POA: Diagnosis not present

## 2021-09-30 DIAGNOSIS — R0602 Shortness of breath: Secondary | ICD-10-CM | POA: Diagnosis present

## 2021-09-30 DIAGNOSIS — Z91040 Latex allergy status: Secondary | ICD-10-CM | POA: Diagnosis not present

## 2021-09-30 DIAGNOSIS — F32A Depression, unspecified: Secondary | ICD-10-CM | POA: Diagnosis not present

## 2021-09-30 DIAGNOSIS — Z9981 Dependence on supplemental oxygen: Secondary | ICD-10-CM | POA: Diagnosis not present

## 2021-09-30 DIAGNOSIS — I700 Atherosclerosis of aorta: Secondary | ICD-10-CM | POA: Diagnosis not present

## 2021-09-30 DIAGNOSIS — I5031 Acute diastolic (congestive) heart failure: Secondary | ICD-10-CM | POA: Diagnosis not present

## 2021-09-30 DIAGNOSIS — J449 Chronic obstructive pulmonary disease, unspecified: Secondary | ICD-10-CM | POA: Diagnosis not present

## 2021-09-30 DIAGNOSIS — Z880 Allergy status to penicillin: Secondary | ICD-10-CM | POA: Diagnosis not present

## 2021-09-30 DIAGNOSIS — N179 Acute kidney failure, unspecified: Secondary | ICD-10-CM | POA: Diagnosis not present

## 2021-09-30 DIAGNOSIS — I5033 Acute on chronic diastolic (congestive) heart failure: Secondary | ICD-10-CM | POA: Diagnosis not present

## 2021-09-30 DIAGNOSIS — G4733 Obstructive sleep apnea (adult) (pediatric): Secondary | ICD-10-CM | POA: Diagnosis present

## 2021-09-30 DIAGNOSIS — K469 Unspecified abdominal hernia without obstruction or gangrene: Secondary | ICD-10-CM

## 2021-09-30 LAB — ECHOCARDIOGRAM COMPLETE
AR max vel: 1.62 cm2
AV Peak grad: 15.7 mmHg
Ao pk vel: 1.98 m/s
Area-P 1/2: 2.15 cm2
S' Lateral: 2.6 cm

## 2021-09-30 LAB — GLUCOSE, CAPILLARY
Glucose-Capillary: 119 mg/dL — ABNORMAL HIGH (ref 70–99)
Glucose-Capillary: 248 mg/dL — ABNORMAL HIGH (ref 70–99)
Glucose-Capillary: 203 mg/dL — ABNORMAL HIGH (ref 70–99)
Glucose-Capillary: 281 mg/dL — ABNORMAL HIGH (ref 70–99)

## 2021-09-30 LAB — HEMOGLOBIN A1C
Hgb A1c MFr Bld: 9.8 % — ABNORMAL HIGH (ref 4.8–5.6)
Mean Plasma Glucose: 234.56 mg/dL

## 2021-09-30 LAB — BASIC METABOLIC PANEL
Anion gap: 10 (ref 5–15)
BUN: 17 mg/dL (ref 8–23)
CO2: 22 mmol/L (ref 22–32)
Calcium: 9.6 mg/dL (ref 8.9–10.3)
Chloride: 107 mmol/L (ref 98–111)
Creatinine, Ser: 0.94 mg/dL (ref 0.44–1.00)
GFR, Estimated: >60 mL/min (ref >60)
Glucose, Bld: 129 mg/dL — ABNORMAL HIGH (ref 70–99)
Potassium: 3.8 mmol/L (ref 3.5–5.1)
Sodium: 139 mmol/L (ref 135–145)

## 2021-09-30 LAB — TROPONIN I (HIGH SENSITIVITY): Troponin I (High Sensitivity): 17 ng/L (ref <18)

## 2021-09-30 LAB — POTASSIUM: Potassium: 4.1 mmol/L (ref 3.5–5.1)

## 2021-09-30 MED ORDER — INSULIN ASPART 100 UNIT/ML IJ SOLN
0.0000 [IU] | Freq: Three times a day (TID) | INTRAMUSCULAR | Status: DC
  Administered 2021-09-30 – 2021-10-01 (×3): 5 [IU] via SUBCUTANEOUS
  Administered 2021-10-01: 8 [IU] via SUBCUTANEOUS
  Administered 2021-10-01 – 2021-10-02 (×2): 11 [IU] via SUBCUTANEOUS
  Administered 2021-10-02: 8 [IU] via SUBCUTANEOUS
  Administered 2021-10-02: 11 [IU] via SUBCUTANEOUS
  Administered 2021-10-03: 8 [IU] via SUBCUTANEOUS
  Administered 2021-10-03: 5 [IU] via SUBCUTANEOUS

## 2021-09-30 MED ORDER — LIVING WELL WITH DIABETES BOOK
Freq: Once | Status: AC
  Filled 2021-09-30: qty 1

## 2021-09-30 MED ORDER — ACETAMINOPHEN 650 MG RE SUPP: 650.0000 mg | Freq: Four times a day (QID) | RECTAL | Status: DC | PRN

## 2021-09-30 MED ORDER — ACETAMINOPHEN 325 MG PO TABS: 650.0000 mg | ORAL_TABLET | Freq: Four times a day (QID) | ORAL | Status: DC | PRN

## 2021-09-30 MED ORDER — ENOXAPARIN SODIUM 40 MG/0.4ML IJ SOSY
40.0000 mg | PREFILLED_SYRINGE | INTRAMUSCULAR | Status: DC
  Administered 2021-09-30 – 2021-10-03 (×4): 40 mg via SUBCUTANEOUS
  Filled 2021-09-30 (×4): qty 0.4

## 2021-09-30 MED ORDER — PERFLUTREN LIPID MICROSPHERE
1.0000 mL | INTRAVENOUS | Status: AC | PRN
  Administered 2021-09-30: 4 mL via INTRAVENOUS

## 2021-09-30 MED ORDER — FUROSEMIDE 10 MG/ML IJ SOLN
40.0000 mg | Freq: Every day | INTRAMUSCULAR | Status: DC
  Administered 2021-09-30 – 2021-10-01 (×2): 40 mg via INTRAVENOUS
  Filled 2021-09-30 (×2): qty 4

## 2021-09-30 MED ORDER — INSULIN ASPART 100 UNIT/ML IJ SOLN
0.0000 [IU] | Freq: Every day | INTRAMUSCULAR | Status: DC
  Administered 2021-09-30: 3 [IU] via SUBCUTANEOUS
  Administered 2021-10-01 – 2021-10-02 (×2): 4 [IU] via SUBCUTANEOUS

## 2021-09-30 MED ORDER — SODIUM CHLORIDE 0.9 % IV SOLN
1.0000 g | INTRAVENOUS | Status: DC
  Administered 2021-09-30 – 2021-10-03 (×4): 1 g via INTRAVENOUS
  Filled 2021-09-30 (×5): qty 10

## 2021-09-30 NOTE — Consult Note (Signed)
Consultation Note Date: 09/30/2021   Patient Name: [name]  DOB: Feb 04, 1941  MRN: [id number]  Age / Sex: 81 y.o., female  PCP: Pcp, No Referring Physician: Geradine Girt, DO  Reason for Consultation: Establishing goals of care  HPI/Patient Profile: 81 y.o. female  with past medical history of HTN/HLD, IDDM, asthma, OSA/obesity, PVD, history of breast cancer left 1992 right 2007 admitted on 09/29/2021 with dyspnea on exertion, chest pain/acute hypoxia, abnormal breast finding.   Clinical Assessment and Goals of Care: I have reviewed medical records including EPIC notes, labs and imaging, received report from RN, assessed the patient.  [name], [name], is lying quietly in bed reading the dinner menu.  She greets me, making and mostly keeping eye contact.  She appears chronically ill and somewhat frail, obese.  She is alert and oriented x3, able to make her basic needs known.  Her son, [name], is present at bedside.  We met at the bedside along with son/healthcare agent, [name], to discuss diagnosis prognosis, GOC, EOL wishes, disposition and options.  I introduced Palliative Medicine as specialized medical care for people living with serious illness. It focuses on providing relief from the symptoms and stress of a serious illness. The goal is to improve quality of life for both the patient and the family.  We discussed a brief life review of the patient.  [name] has been living in [place] since October 2022.  She has 3 children [name], and [name].  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  We then focused on their current illness.  We talk in detail about how the heart works including but not limited to electrical, mechanical, and vascular.  We talk in detail about heart failure and its progressive  nature.  We talked about echo testing for EF, previous results and normal ranges.  We also talk about her hernia.  At this point [name] states that she has been told that she could have surgery if she so desires but she would not do so unless medically necessary.  We also talk about her abnormal breast findings and her right to choose whether she seeks treatment or not.  It is noted in her chart that she had previous appointments that she did not attend.  I shared that she chooses what care she receives, what this time looks like and feels like for her.  At this point she is noncommittal about whether she would seek further investigation for breast mass.  The natural disease trajectory and expectations at EOL were discussed.  Advanced directives, concepts specific to code status, artifical feeding and hydration, and rehospitalization were considered and discussed.  We talked about the concept of "treat the treatable, but allowing natural passing".  At this point, [name] would like full scope/full code.  She does endorse that as her body changes she may make a different choice.  We talked about length of time for life support.  At this point she suggest that she would not want tracheostomy.  Hospice and Palliative Care services outpatient were explained and offered.  We talked about the benefits of outpatient palliative services for continued goals of care discussions.  [name] shares that his father is active with hospice of the [place] presently.  [name] and [name] would like hospice of the [facility] for outpatient palliative services.  Discussed the importance of continued conversation with family and the medical providers regarding overall plan of care and treatment options, ensuring decisions are within the context of the patient's values and GOCs.  Questions and concerns were addressed.  The family was encouraged to call with questions or concerns.  PMT will continue to support holistically.  Conference with  attending, bedside nursing staff, transition of care team related to patient condition, needs, goals of care, disposition, outpatient palliative request.   HCPOA HCPOA -son, [name], is first healthcare power of attorney, daughter [name] is second.  Mrs. Crill also has a son [name] who lives in [place].    SUMMARY OF RECOMMENDATIONS   At this point continue full scope/full code Time for outcomes. Return to [place] with short-term rehab if qualified. Requesting outpatient palliative services, provider of choice is hospice of the [place].   Code Status/Advance Care Planning: Full code -we talk about the concept of "treat the treatable, but allowing natural passing.  We talked about setting limits on life support.  Symptom Management:  Per hospitalist, no additional needs at this time.  Palliative Prophylaxis:  No special needs at this time.  Additional Recommendations (Limitations, Scope, Preferences): Full Scope Treatment  Psycho-social/Spiritual:  Desire for further Chaplaincy support:no Additional Recommendations: Caregiving  Support/Resources  Prognosis:  Unable to determine, based on outcomes.  6 months or more would not be surprising based on chronic illness burden, functional status.  Discharge Planning:  Return to [place] where she has been since October 2022 with the benefits of hosp[facility] the [facility] outpatient palliative care.       Primary Diagnoses: Present on Admission:  Benign essential HTN  Hyperlipidemia  Peripheral blood vessel disorder (HCC)  SOB (shortness of breath)   I have reviewed the medical record, interviewed the patient and family, and examined the patient. The following aspects are pertinent.  Past Medical History:  Diagnosis Date   Asthma    Breast cancer ([place])    LT 1992, RT 2007   Coronary atherosclerosis    Depression    DM (diabetes mellitus) ([place])    Hyperlipidemia    Hypertension    OSA (obstructive  sleep apnea) 01/20/2014   Osteopenia    Personal history of radiation therapy    PVD (peripheral vascular disease) ([place])    Social History   Socioeconomic History   Marital status: Married    Spouse name: Not on file   Number of children: Not on file   Years of education: Not on file   Highest education level: Not on file  Occupational History   Occupation: retired  Tobacco Use   Smoking status: Former    Packs/day: 1.00    Years: 40.00    Pack years: 40.00    Types: Cigarettes    Quit date: 05/10/2003    Years since quitting: 18.4   Smokeless tobacco: Never  Vaping Use   Vaping Use: Never used  Substance and Sexual Activity   Alcohol use: No   Drug use: No   Sexual activity: Not on file  Other Topics Concern  Not on file  Social History Narrative   Not on file   Social Determinants of Health   Financial Resource Strain: Not on file  Food Insecurity: Not on file  Transportation Needs: Not on file  Physical Activity: Not on file  Stress: Not on file  Social Connections: Not on file   Family History  Problem Relation Age of Onset   Heart disease Mother    Heart disease Father    Scheduled Meds:  enoxaparin (LOVENOX) injection  40 mg Subcutaneous Q24H   furosemide  40 mg Intravenous Daily   insulin aspart  0-15 Units Subcutaneous TID WC   insulin aspart  0-5 Units Subcutaneous QHS   Continuous Infusions:  cefTRIAXone (ROCEPHIN)  IV 1 g (09/30/21 1116)   PRN Meds:.acetaminophen **OR** acetaminophen Medications Prior to Admission:  Prior to Admission medications   Medication Sig Start Date End Date Taking? Authorizing Provider  acetaminophen (TYLENOL) 500 MG tablet Take 500 mg by mouth every 12 (twelve) hours as needed (general pain).   Yes [provider]  albuterol (PROAIR HFA) 108 (90 Base) MCG/ACT inhaler Inhale 1-2 puffs into the lungs every 6 (six) hours as needed for wheezing or shortness of breath. Patient taking differently: Inhale 2 puffs  into the lungs every 4 (four) hours as needed for wheezing or shortness of breath. 06/27/19  Yes Parrett, Tammy S, NP  aspirin EC 81 MG tablet Take 81 mg by mouth every morning. Swallow whole.   Yes [provider]  azelastine (ASTELIN) 0.1 % nasal spray Place 2 sprays into both nostrils 2 (two) times daily as needed for rhinitis.   Yes [provider]  Cholecalciferol (VITAMIN D) 50 MCG (2000 UT) tablet Take 2,000 Units by mouth every morning.   Yes [provider]  ezetimibe (ZETIA) 10 MG tablet Take 10 mg by mouth every morning.   Yes [provider]  fluticasone (FLONASE) 50 MCG/ACT nasal spray Place 1 spray into both nostrils daily.   Yes [provider]  insulin aspart (NOVOLOG) 100 UNIT/ML injection Inject 3-14 Units into the skin See admin instructions. Inject 3-14 units subcutaneously per sliding scale: CBG 70-100 3 units, 101-150 5 units, 151-200 8 units, 201-250 10 units, 251-300 12 units, >300 14 units   Yes [provider]  insulin degludec (TRESIBA FLEXTOUCH) 100 UNIT/ML FlexTouch Pen Inject 25-50 Units into the skin See admin instructions. Inject 50 units subcutaneously twice daily, inject 25 units every 12 hours as needed for BS <100   Yes [provider]  irbesartan (AVAPRO) 75 MG tablet Take 1 tablet (75 mg total) by mouth daily. 03/28/16  Yes Tanda Rockers, MD  isosorbide mononitrate (IMDUR) 60 MG 24 hr tablet TAKE 1 TABLET BY MOUTH ONCE DAILY --PLEASE  KEEP  [facility]  APPT  FOR  FURTHER  REFILLS Patient taking differently: Take 60 mg by mouth every morning. 05/11/21  Yes Belva Crome, MD  loratadine (CLARITIN) 10 MG tablet Take 10 mg by mouth every morning.   Yes [provider]  rosuvastatin (CRESTOR) 40 MG tablet Take 40 mg by mouth every morning.   Yes [provider]  sertraline (ZOLOFT) 100 MG tablet Take 100 mg by mouth every morning.   Yes [provider]   Allergies  Allergen  Reactions   Niacin Hives and Shortness Of Breath   Penicillins Other (See Comments)    Did it involve swelling of the face/tongue/throat, SOB, or low BP? Y Did it involve sudden  or severe rash/hives, skin peeling, or any reaction on the inside of your mouth or nose? UNK Did you need to seek medical attention at a hospital or doctor's office? N When did it last happen? Can't remember   If all above answers are "NO", may proceed with cephalosporin use.    Saccharin Palpitations    Allergen: IOX:BDZHGD Dye+methocarbamol+saccharin (Saccharin)   Clindamycin/Lincomycin Other (See Comments)    Unknown reaction - listed on MAR   Codeine Other (See Comments)    Night-mare   Diclofenac Sodium Hives   Erythromycin Diarrhea    GI upset   Glipizide Other (See Comments)    GI upset   Iodinated Contrast Media Other (See Comments)    Unknown reaction - listed on MAR   Latex Other (See Comments)    Unknown reaction - listed on MAR   Macrolides And Ketolides Other (See Comments)    Unknown reaction - listed on MAR   Metformin And Related Diarrhea   Methocarbamol Other (See Comments)    Tarchycardia   Ramipril Other (See Comments)    Unknown reaction   Sulfa Antibiotics Diarrhea   Trimox [Amoxicillin] Other (See Comments)    GI upset   Tetanus Toxoid Itching, Swelling and Rash   Review of Systems  Unable to perform ROS: Age   Physical Exam Vitals and nursing note reviewed.  Constitutional:      General: She is not in acute distress.    Appearance: She is obese. She is ill-appearing.  Cardiovascular:     Rate and Rhythm: Normal rate.  Pulmonary:     Effort: Pulmonary effort is normal. No tachypnea.  Abdominal:     Palpations: Abdomen is soft. There is mass.  Musculoskeletal:     Right lower leg: No edema.     Left lower leg: No edema.  Skin:    General: Skin is warm and dry.  Neurological:     Mental Status: She is alert and oriented to person, place, and time.  Psychiatric:         Mood and Affect: Mood normal.        Behavior: Behavior normal.    Vital Signs: BP (!) 145/48 (BP Location: Right Arm)   Pulse 64   Temp 98.2 F (36.8 C) (Oral)   Resp 19   SpO2 92%  Pain Scale: 0-10   Pain Score: 0-No pain   SpO2: SpO2: 92 % O2 Device:SpO2: 92 % O2 Flow Rate: .O2 Flow Rate (L/min): 3 L/min  IO: Intake/output summary:  Intake/Output Summary (Last 24 hours) at 09/30/2021 1612 Last data filed at 09/30/2021 1450 Gross per 24 hour  Intake 120 ml  Output 1300 ml  Net -1180 ml    LBM: Last BM Date : 09/29/21 Baseline Weight:   Most recent weight:       Palliative Assessment/Data:   Flowsheet Rows    Flowsheet Row Most Recent Value  Intake Tab   Referral Department Hospitalist  Unit at Time of Referral Cardiac/Telemetry Unit  Palliative Care Primary Diagnosis Cardiac  Date Notified 09/30/21  Palliative Care Type New Palliative care  Reason for referral Clarify Goals of Care  Date of Admission 09/29/21  Date first seen by Palliative Care 09/30/21  # of days Palliative referral response time 0 Day(s)  # of days IP prior to Palliative referral 1  Clinical Assessment   Palliative Performance Scale Score 40%  Pain Max last 24 hours Not able to report  Pain Min Last 24  hours Not able to report  Dyspnea Max Last 24 Hours Not able to report  Dyspnea Min Last 24 hours Not able to report  Psychosocial & Spiritual Assessment   Palliative Care Outcomes        Time In: 1430 Time Out: 1545 Time Total: 75 minutes  Greater than 50%  of this time was spent counseling and coordinating care related to the above assessment and plan.  Signed by: Drue Novel, NP   Please contact Palliative Medicine Team phone at [phone] for questions and concerns.  For individual provider: See Shea Evans

## 2021-09-30 NOTE — TOC Initial Note (Signed)
Transition of Care [facility]) - Initial/Assessment Note    Patient Details  Name: [name] MRN: [id number] Date of Birth: 1941-02-28  [facility] CM/SW Contact:    Coralee Pesa, [place] Phone Number: 09/30/2021, 11:52 AM  Clinical Narrative:                 CSW spoke with Dorthey Sawyer, [place] who confirmed pt is a resident there. Will need FL2 and Dc summary prior to DC. Report to be called to [phone] prior to DC. [facility] does not provide transport, son said he possibly could. Pt has been there since October, plan is to return when medically stable.  Expected Discharge Plan: Assisted Living Barriers to Discharge: Continued Medical Work up   Patient Goals and CMS Choice     Choice offered to / list presented to : Adult Children, Patient  Expected Discharge Plan and Services Expected Discharge Plan: Assisted Living     Post Acute Care Choice: Resumption of Svcs/PTA Provider Living arrangements for the past 2 months: Assisted Living Facility                                      Prior Living Arrangements/Services Living arrangements for the past 2 months: [place] Lives with:: Facility Resident Patient language and need for interpreter reviewed:: Yes Do you feel safe going back to the place where you live?: Yes      Need for Family Participation in Patient Care: Yes (Comment) Care giver support system in place?: Yes (comment)   Criminal Activity/Legal Involvement Pertinent to Current Situation/Hospitalization: No - Comment as needed  Activities of Daily Living      Permission Sought/Granted Permission sought to share information with : Family Supports Permission granted to share information with : Yes, Verbal Permission Granted  Share Information with NAME: Danyla Wattley     Permission granted to share info w Relationship: Son  Permission granted to share info w Contact Information: [phone]  Emotional  Assessment Appearance:: Appears stated age Attitude/Demeanor/Rapport: Engaged Affect (typically observed): Appropriate   Alcohol / Substance Use: Not Applicable Psych Involvement: No (comment)  Admission diagnosis:  SOB (shortness of breath) [R06.02] Hypoxia [R09.02] Acute on chronic heart failure, unspecified heart failure type ([place]) [I50.9] Mass of right breast, unspecified quadrant [N63.10] Patient Active Problem List   Diagnosis Date Noted   Dyspnea on exertion 09/30/2021   Abnormal finding on breast imaging 09/30/2021   Abdominal hernia 09/30/2021   Breast cancer ([place])    COPD (chronic obstructive pulmonary disease) ([place]) 07/08/2016   Acute bronchitis 04/26/2016   Asthma with acute exacerbation 04/26/2016   Obesity (BMI 30-39.9) 12/20/2014   Chest pain 11/11/2014   Breast cancer, female ([place]) 11/11/2014   Benign neoplasm of colon 11/11/2014   CAFL (chronic airflow limitation) ([place]) 11/11/2014   Clinical depression 11/11/2014   Major depressive episode 11/11/2014   Diabetes mellitus with peripheral circulatory disorder ([place]) 11/11/2014   Diabetes mellitus, type 2 ([place]) 11/11/2014   Diabetes mellitus type 2, uncontrolled 11/11/2014   Benign essential HTN 11/11/2014   Combined fat and carbohydrate induced hyperlipemia 11/11/2014   Adiposity 11/11/2014   Bone/cartilage disorder 11/11/2014   Blood clot associated with vein wall inflammation 11/11/2014   Hypercholesterolemia without hypertriglyceridemia 11/11/2014   Peripheral blood vessel disorder ([place]) 11/11/2014   Left foot pain 11/11/2014   Dysesthesia 11/11/2014   Carotid bruit  11/11/2014   COPD GOLD 0 vs asthma 08/26/2014   Venous insufficiency of right lower extremity 08/26/2014   OSA (obstructive sleep apnea) 01/20/2014   Type II diabetes mellitus with complication ([place]) 78/58/8502   Abnormal echocardiogram 12/11/2013   Dermatitis, eczematoid 12/11/2013   Coronary artery disease involving native coronary artery  of native heart with angina pectoris ([place]) 06/10/2013   Nocturnal hypoxemia 10/24/2012   Hyperlipidemia 05/22/2008   Dyspnea 05/22/2008   NEOPLASM, MALIGNANT, BREAST, HX OF 05/22/2008   PCP:  Pcp, No Pharmacy:  No Pharmacies Listed    Social Determinants of Health (SDOH) Interventions    Readmission Risk Interventions     View : No data to display.

## 2021-09-30 NOTE — ED Provider Notes (Signed)
Patient signed out pending CTA of the chest.  In brief, patient presented with increased dyspnea on exertion and some chest pressure.  Clinically most consistent with CHF although no significant effusions on x-ray.  Patient dropped O2 sats precipitously upon standing.  She is currently on 3 L of oxygen.  She states she is comfortable.  She normally wears CPAP at night but is not on home oxygen.  CT scan does not show any evidence of PE.  It does show some changes in the right breast concerning for mastitis and cannot rule out underlying mass.  Patient states she has not had a mammogram in approximately 2 years.  I do see where she had some imaging and biopsy of the left breast several years ago.  She denies any overlying skin changes or noted lumps in the right breast.  I advised her that she will need to follow-up with mammography and appropriate imaging.  Patient was given Lasix for presumed volume overload.  Will admit for hypoxia  Physical Exam  BP (!) 115/48   Pulse 88   Temp (!) 97.4 F (36.3 C) (Oral)   Resp (!) 22   SpO2 94%   Physical Exam Obese, nontoxic Cannula in place, no respiratory distress  Procedures  Procedures  ED Course / MDM    Medical Decision Making Amount and/or Complexity of Data Reviewed Labs: ordered. Radiology: ordered.  Risk Prescription drug management. Decision regarding hospitalization.   Problem List Items Addressed This Visit   None Visit Diagnoses     Hypoxia    -  Primary   Acute on chronic heart failure, unspecified heart failure type (HCC)       Relevant Medications   furosemide (LASIX) injection 40 mg   Mass of right breast, unspecified quadrant                 Merryl Hacker, MD 09/30/21 [phone]

## 2021-09-30 NOTE — H&P (Signed)
History and Physical    [name] VVO:[id number] DOB: 03-02-1941 DOA: 09/29/2021  PCP: Pcp, No  Patient coming from:  Nursing home  Chief Complaint: Shortness of breath  HPI: [name] is a 81 y.o. female with medical history significant of hypertension, hyperlipidemia, insulin-dependent diabetes, asthma, OSA, PVD, history of breast cancer presenting to the ED via EMS for evaluation of dyspnea on exertion and chest pain.  In the ED, patient was tachycardic and desatted to mid 80s on room air.  She was placed on 3 L supplemental oxygen.  Labs showing WBC 12.4 (no significant change compared to prior labs), hemoglobin 14.0, platelet count 328k.  Sodium 135, potassium 5.8, chloride 107, bicarb 24, BUN 19, creatinine 0.9, glucose 262.  Repeat labs showing potassium 4.1.  COVID and flu negative.  High-sensitivity troponin negative x2.  BNP 212.    CTA chest showing mild lingular scarring and/or atelectasis.  Findings suggestive of marked severity right-sided mastitis with a partially calcified soft tissue lesion seen within the lower outer quadrant of the right breast.  The presence of an underlying neoplastic process cannot be excluded.  Correlation with mammography is recommended.  Large ventral hernia along the midline upper abdomen containing fat and a segment of normal-appearing transverse colon.  Aortic atherosclerosis.  Patient was given IV Lasix 40 mg.  Patient reports history of dyspnea on exertion for several years.  States previously she was on 24-hour home oxygen but currently only requiring CPAP at night.  She thinks her breathing has not changed from baseline. Denies orthopnea, paroxysmal nocturnal dyspnea, or lower extremity edema.  Does report an episode of chest pain yesterday as she was carrying some boxes into her house.  She describes it as lower substernal/upper abdominal pressure-like pain which lasted about a minute.  Associated with dyspnea but no diaphoresis or  nausea.  She denies history of blood clots.  Reports history of breast cancer several years ago and no longer on treatment.  Patient denies any breast skin changes or lumps.  Review of Systems:  Review of Systems  All other systems reviewed and are negative.  Past Medical History:  Diagnosis Date   Asthma    Breast cancer ([place])    LT 1992, RT 2007   Coronary atherosclerosis    Depression    DM (diabetes mellitus) ([facility])    Hyperlipidemia    Hypertension    OSA (obstructive sleep apnea) 01/20/2014   Osteopenia    Personal history of radiation therapy    PVD (peripheral vascular disease) ([place])     Past Surgical History:  Procedure Laterality Date   APPENDECTOMY     BREAST LUMPECTOMY     1992 on left, 2007 on right   CHOLECYSTECTOMY     CYST REMOVAL NECK       reports that she quit smoking about 18 years ago. Her smoking use included cigarettes. She has a 40.00 pack-year smoking history. She has never used smokeless tobacco. She reports that she does not drink alcohol and does not use drugs.  Allergies  Allergen Reactions   Niacin Hives and Shortness Of Breath   Penicillins     Did it involve swelling of the face/tongue/throat, SOB, or low BP? Y Did it involve sudden or severe rash/hives, skin peeling, or any reaction on the inside of your mouth or nose? UNK Did you need to seek medical attention at a hospital or doctor's office? N When did it last happen? Can't remember   If  all above answers are "NO", may proceed with cephalosporin use.    Saccharin Palpitations    Allergen: WCB:JSEGBT Dye+methocarbamol+saccharin (Saccharin)   Amoxicillin Other (See Comments)    GI upset   Clindamycin/Lincomycin    Codeine     Night-mare   Diclofenac Sodium Hives   Erythromycin Diarrhea    GI upset   Glipizide Other (See Comments)    GI upset   Iodinated Contrast Media    Latex    Macrolides And Ketolides    Metformin And Related Diarrhea   Methocarbamol Other (See Comments)     Tarchycardia   Ramipril Other (See Comments)    Pt can't remember   Sulfa Antibiotics Diarrhea   Tetanus Toxoid Itching, Swelling and Rash    REACTION: swelling    Family History  Problem Relation Age of Onset   Heart disease Mother    Heart disease Father     Prior to Admission medications   Medication Sig Start Date End Date Taking? Authorizing Provider  acetaminophen (TYLENOL) 500 MG tablet Take 500 mg by mouth 2 (two) times daily as needed for moderate pain.   Yes [provider]  albuterol (PROAIR HFA) 108 (90 Base) MCG/ACT inhaler Inhale 1-2 puffs into the lungs every 6 (six) hours as needed for wheezing or shortness of breath. Patient taking differently: Inhale 2 puffs into the lungs every 4 (four) hours as needed for wheezing or shortness of breath. 06/27/19  Yes Parrett, Tammy S, NP  aspirin EC 81 MG tablet Take 81 mg by mouth every morning. Swallow whole.   Yes [provider]  azelastine (ASTELIN) 0.1 % nasal spray Place 2 sprays into both nostrils 2 (two) times daily as needed for rhinitis.   Yes [provider]  Cholecalciferol (VITAMIN D) 50 MCG (2000 UT) tablet Take 2,000 Units by mouth every morning.   Yes [provider]  ezetimibe (ZETIA) 10 MG tablet Take 10 mg by mouth daily.   Yes [provider]  fluticasone (FLONASE) 50 MCG/ACT nasal spray Place 1 spray into both nostrils daily.   Yes [provider]  insulin aspart (NOVOLOG) 100 UNIT/ML injection Inject 0-14 Units into the skin See admin instructions. 3 times daily per sliding scale 70-100 = 3 units 101-150 = 5 units 151-200 = 8 units  201-250 = 10 units  251-300 = 120 units  301 + = 14 units   Yes [provider]  Insulin Degludec (TRESIBA [place]) Inject 25 Units into the skin 2 (two) times daily as needed (for blood sugar less than 100).   Yes [provider]  Insulin Degludec (TRESIBA [place]) Inject 50 Units into the skin in the morning and at  bedtime.   Yes [provider]  irbesartan (AVAPRO) 75 MG tablet Take 1 tablet (75 mg total) by mouth daily. 03/28/16  Yes Tanda Rockers, MD  isosorbide mononitrate (IMDUR) 60 MG 24 hr tablet TAKE 1 TABLET BY MOUTH ONCE DAILY --PLEASE  KEEP  [place]  APPT  FOR  FURTHER  REFILLS Patient taking differently: Take 60 mg by mouth daily. 05/11/21  Yes Belva Crome, MD  loratadine (CLARITIN) 10 MG tablet Take 10 mg by mouth daily.   Yes [provider]  rosuvastatin (CRESTOR) 40 MG tablet Take 40 mg by mouth daily.   Yes [provider]  sertraline (ZOLOFT) 100 MG tablet Take 100 mg by mouth daily.   Yes [provider]  insulin isophane & regular human (NOVOLIN  70/30 FLEXPEN) (70-30) 100 UNIT/ML KwikPen Inject 5-25 Units into the skin See admin instructions. Inject 25 units subcutaneously before breakfast, 5 units before lunch and 25 units before supper Patient not taking: Reported on 09/30/2021    [provider]  OXYGEN Inhale 2 L into the lungs See admin instructions. Use whenever sleeping - naps and overnight    [provider]    Physical Exam: Vitals:   09/30/21 0030 09/30/21 0145 09/30/21 0149 09/30/21 0150  BP:  (!) 137/52    Pulse: 78     Resp: 17 (!) '23 20 17  '$ Temp:  97.7 F (36.5 C)    TempSrc:  Oral    SpO2: 92%       Physical Exam Vitals reviewed.  Constitutional:      General: She is not in acute distress. HENT:     Head: Normocephalic and atraumatic.  Eyes:     Extraocular Movements: Extraocular movements intact.     Conjunctiva/sclera: Conjunctivae normal.  Cardiovascular:     Rate and Rhythm: Normal rate and regular rhythm.     Pulses: Normal pulses.  Pulmonary:     Effort: Pulmonary effort is normal. No respiratory distress.     Breath sounds: No wheezing or rales.  Abdominal:     General: Bowel sounds are normal.     Palpations: Abdomen is soft.     Tenderness: There is no abdominal tenderness. There is no  guarding.  Musculoskeletal:        General: No swelling or tenderness.     Cervical back: Normal range of motion.  Skin:    General: Skin is warm and dry.  Neurological:     General: No focal deficit present.     Mental Status: She is alert and oriented to person, place, and time.     Labs on Admission: I have personally reviewed following labs and imaging studies  CBC: Recent Labs  Lab 09/29/21 2059  WBC 12.4*  NEUTROABS 7.4  HGB 14.0  HCT 43.3  MCV 89.6  PLT 357   Basic Metabolic Panel: Recent Labs  Lab 09/29/21 2043 09/29/21 2254  NA 135  --   K 5.8* 4.1  CL 107  --   CO2 24  --   GLUCOSE 262*  --   BUN 19  --   CREATININE 0.93  --   CALCIUM 9.2  --    GFR: CrCl cannot be calculated (Unknown ideal weight.). Liver Function Tests: No results for input(s): AST, ALT, ALKPHOS, BILITOT, PROT, ALBUMIN in the last 168 hours. No results for input(s): LIPASE, AMYLASE in the last 168 hours. No results for input(s): AMMONIA in the last 168 hours. Coagulation Profile: No results for input(s): INR, PROTIME in the last 168 hours. Cardiac Enzymes: No results for input(s): CKTOTAL, CKMB, CKMBINDEX, TROPONINI in the last 168 hours. BNP (last 3 results) No results for input(s): PROBNP in the last 8760 hours. HbA1C: No results for input(s): HGBA1C in the last 72 hours. CBG: No results for input(s): GLUCAP in the last 168 hours. Lipid Profile: No results for input(s): CHOL, HDL, LDLCALC, TRIG, CHOLHDL, LDLDIRECT in the last 72 hours. Thyroid Function Tests: No results for input(s): TSH, T4TOTAL, FREET4, T3FREE, THYROIDAB in the last 72 hours. Anemia Panel: No results for input(s): VITAMINB12, FOLATE, FERRITIN, TIBC, IRON, RETICCTPCT in the last 72 hours. Urine analysis:    Component Value Date/Time   COLORURINE YELLOW 10/27/2020 0243   APPEARANCEUR HAZY (A) 10/27/2020 0243  LABSPEC >1.046 (H) 10/27/2020 0243   PHURINE 5.0 10/27/2020 0243   GLUCOSEU 50 (A) 10/27/2020  0243   HGBUR NEGATIVE 10/27/2020 0243   BILIRUBINUR NEGATIVE 10/27/2020 0243   KETONESUR NEGATIVE 10/27/2020 0243   PROTEINUR 30 (A) 10/27/2020 0243   NITRITE NEGATIVE 10/27/2020 0243   LEUKOCYTESUR TRACE (A) 10/27/2020 0243    Radiological Exams on Admission: I have personally reviewed images CT Angio Chest PE W/Cm &/Or Wo Cm  Result Date: 09/29/2021 CLINICAL DATA:  Shortness of breath with exertion. EXAM: CT ANGIOGRAPHY CHEST WITH CONTRAST TECHNIQUE: Multidetector CT imaging of the chest was performed using the standard protocol during bolus administration of intravenous contrast. Multiplanar CT image reconstructions and MIPs were obtained to evaluate the vascular anatomy. RADIATION DOSE REDUCTION: This exam was performed according to the departmental dose-optimization program which includes automated exposure control, adjustment of the mA and/or kV according to patient size and/or use of iterative reconstruction technique. CONTRAST:  80m OMNIPAQUE IOHEXOL 300 MG/ML  SOLN COMPARISON:  August 04, 2005 FINDINGS: Cardiovascular: There is marked severity calcification of the thoracic aorta, without evidence of aortic aneurysm or dissection. Satisfactory opacification of the pulmonary arteries to the segmental level. No evidence of pulmonary embolism. Normal heart size with marked severity coronary artery calcification. No pericardial effusion. Mediastinum/Nodes: No enlarged mediastinal, hilar, or axillary lymph nodes. Thyroid gland, trachea, and esophagus demonstrate no significant findings. Lungs/Pleura: Mild scarring and/or atelectasis is seen within the lingular region. There is no evidence of acute infiltrate, pleural effusion or pneumothorax. Upper Abdomen: No acute abnormality. Musculoskeletal: A 9.8 cm x 4.1 cm ventral hernia is seen along the midline the upper abdomen. This contains fat and a segment of normal appearing transverse colon. Marked severity cutaneous thickening is seen throughout the  right breast. Associated subcutaneous inflammatory fat stranding is noted. A 4.0 cm x 1.7 cm ill-defined lobulated, partially calcified soft tissue lesion is seen within the lower outer quadrant of the right breast. Multilevel degenerative changes seen throughout the thoracic spine. Review of the MIP images confirms the above findings. IMPRESSION: 1. Mild lingular scarring and/or atelectasis 2. Findings suggestive of marked severity right-sided mastitis with a partially calcified soft tissue lesion seen within the lower outer quadrant of the right breast. The presence of an underlying neoplastic process cannot be excluded. Correlation with mammography is recommended. 3. Large ventral hernia along the midline the upper abdomen containing fat and a segment of normal appearing transverse colon. Aortic Atherosclerosis (ICD10-I70.0). Electronically Signed   By: TVirgina NorfolkM.D.   On: 09/29/2021 23:47   DG Chest Port 1 View  Result Date: 09/29/2021 CLINICAL DATA:  Shortness of breath EXAM: PORTABLE CHEST 1 VIEW COMPARISON:  05/07/2019 FINDINGS: Transverse diameter of heart is increased. Central pulmonary vessels are more prominent. There are no signs of alveolar pulmonary edema. Linear densities seen in both lower lung fields. There is no significant pleural effusion. Patient's chin is partly obscuring the apices. As far as seen, there is no definite demonstrable pneumothorax. Surgical clips are seen in the left chest wall. IMPRESSION: Cardiomegaly. Central pulmonary vessels are more prominent which may be due to semi upright position or suggest mild CHF. Increased interstitial markings are seen in both lower lung fields suggesting scarring or interstitial edema or interstitial pneumonia. There is no focal pulmonary consolidation. Electronically Signed   By: PElmer PickerM.D.   On: 09/29/2021 20:19    EKG: Independently reviewed.  Initial EKG showing ?Afib with RVR.  Repeat EKG showing sinus rhythm, no  acute ischemic changes.  Assessment and Plan  Dyspnea on exertion, chest pain Acute hypoxic respiratory failure Patient desatted to mid 80s on room air in the ED, currently stable on 3 L supplemental oxygen.  ACS less likely as high-sensitivity troponin negative x2.  Initial EKG showing ?Afib with RVR, repeat EKG showing sinus rhythm.  CTA chest did not comment on PE, showing mild lingular scarring and/or atelectasis.  BNP 212 and patient does not appear volume overloaded on exam.  Previous echo done in March 2020 showing normal LVEF of 60 to 22% and diastolic parameters with pseudonormalization. -Cardiac monitoring -IV Lasix 40 mg x 1 administered in the ED. -Echocardiogram -Continue supplemental oxygen to keep oxygen saturation above 92%  Abnormal breast findings on imaging Patient with history of breast cancer several years ago and no longer on treatment.  She denies any breast skin changes or lumps.  CTA chest showing findings suggestive of marked severity right-sided mastitis with a partially calcified soft tissue lesion seen within the lower outer quadrant of the right breast.  The presence of an underlying neoplastic process cannot be excluded.   -I have discussed CT findings with the patient. -Outpatient mammography needed for further evaluation.  Abdominal hernia CT showing a large ventral hernia along the midline upper abdomen containing fat and a segment of normal-appearing transverse colon.  No abdominal tenderness on exam.  Patient is aware of history of abdominal hernia. -Outpatient general surgery follow-up.  Insulin-dependent diabetes Poorly controlled - blood glucose 262, no signs of DKA. -Check A1c -Moderate sliding scale insulin ACHS -Resume home basal insulin after pharmacy med rec is done.  OSA -Continue nightly CPAP  Hypertension: Stable Hyperlipidemia Asthma: Stable, no wheezing. PVD -Pharmacy med rec pending.  DVT prophylaxis: Lovenox Code Status: Full  Code (discussed with the patient) Family Communication: No family available at this time. Level of care: Telemetry bed Admission status: It is my clinical opinion that referral for OBSERVATION is reasonable and necessary in this patient based on the above information provided. The aforementioned taken together are felt to place the patient at high risk for further clinical deterioration. However, it is anticipated that the patient may be medically stable for discharge from the hospital within 24 to 48 hours.   Shela Leff MD [facility]  If 7PM-7AM, please contact night-coverage [email or web address]  09/30/2021, 2:14 AM

## 2021-09-30 NOTE — Progress Notes (Signed)
Inpatient Diabetes Program Recommendations  AACE/ADA: New Consensus Statement on Inpatient Glycemic Control (2015)  Target Ranges:  Prepandial:   less than 140 mg/dL      Peak postprandial:   less than 180 mg/dL (1-2 hours)      Critically ill patients:  140 - 180 mg/dL   Lab Results  Component Value Date   GLUCAP 248 (H) 09/30/2021   HGBA1C 9.8 (H) 09/30/2021    Review of Glycemic Control  Latest Reference Range & Units 09/30/21 06:17 09/30/21 12:04  Glucose-Capillary 70 - 99 mg/dL 119 (H) 248 (H)  (H): Data is abnormally high  Diabetes history: DM2 Outpatient Diabetes medications: Tresiba 50 units BID, Novolog 0-14 units TID Current orders for Inpatient glycemic control: Novolog 0-15 units TID and 0-5 units QHS  Inpatient Diabetes Program Recommendations:   Semglee 10 units TID (appx. 0.2 units/kg)  Spoke with patient and son at bedside.  Reviewed patient's current A1c of 9.8% (average BG of 235 mg/dL). Explained what a A1c is and what it measures. Also reviewed goal A1c with patient, importance of good glucose control @ home, and blood sugar goals.  Two different doses of Tyler Aas are listed in the medication reconciliation list.  She does not know how many units of Tyler Aas she get daily.  She is from [facility] assisted [facility] in [place].  The nursing staff administer her medications.  Called [facility] in [place] and spoke with nursing.  Verified she takes Tresiba 50 units BID and Novolog 0-14 units TID.    She states she does not drink any beverages with sugar.  She says the food there consists of a lot of CHO's.  Ordered LWWD booklet.  Son tells me privately that she sneaks a lot of snacks including chocolate.    Will continue to follow while inpatient.  Thank you, Katherine Dixon, MSN, [facility] Coordinator Inpatient Diabetes Program [phone] (team pager from 8a-5p)

## 2021-09-30 NOTE — Progress Notes (Signed)
Auto CPAP set up for pt at bedside (min 6-Max 18) with 2L O2 bled in and FFM. Pt will notify for RT if any assistance is needed placing mask. RT will monitor as needed.

## 2021-09-30 NOTE — Progress Notes (Addendum)
Patient admitted after midnight, please see H&P.  Here with SOB.  She is from ALF ([facility] in [place])  Dyspnea on exertion, chest pain/Acute hypoxic respiratory failure Patient desatted to mid 80s on room air in the ED, currently stable on 3 L supplemental oxygen.  ACS less likely as high-sensitivity troponin negative x2.  -EKG showing sinus rhythm.  CTA chest did not comment on PE, showing mild lingular scarring and/or atelectasis.  BNP 212 and patient does not appear volume overloaded on exam.  Previous echo done in March 2020 showing normal LVEF of 60 to 42% and diastolic parameters with pseudonormalization. -IV Lasix 40 mg- daily -Echocardiogram pending -Continue supplemental oxygen to keep oxygen saturation above 92%- wean as able   Abnormal breast findings on imaging Patient with history of breast cancer several years ago and no longer on treatment.  She denies any breast skin changes or lumps.  CTA chest showing findings suggestive of marked severity right-sided mastitis with a partially calcified soft tissue lesion seen within the lower outer quadrant of the right breast.  The presence of an underlying neoplastic process cannot be excluded.   -I have discussed CT findings with the patient and family-- son says she had several follow up visist regarding this mass that she cancelled  -Outpatient mammography needed for further evaluation. -add IV rocephin (? Mastitis with elevated WBC)   Abdominal hernia CT showing a large ventral hernia along the midline upper abdomen containing fat and a segment of normal-appearing transverse colon.  No abdominal tenderness on exam.  Patient is aware of history of abdominal hernia. -Outpatient general surgery follow-up.   Insulin-dependent diabetes Poorly controlled - blood glucose 262, no signs of DKA. -Moderate sliding scale insulin ACHS -Resume home basal insulin after pharmacy med rec is done.   OSA -Continue nightly CPAP  Palliative care consult  for [place]- discussed with son (he thinks she has a DNR- not on the chart)  Katherine Bear DO

## 2021-10-01 DIAGNOSIS — I5031 Acute diastolic (congestive) heart failure: Secondary | ICD-10-CM

## 2021-10-01 DIAGNOSIS — R0609 Other forms of dyspnea: Secondary | ICD-10-CM | POA: Diagnosis not present

## 2021-10-01 LAB — CBC
HCT: 43.2 % (ref 36.0–46.0)
Hemoglobin: 14.2 g/dL (ref 12.0–15.0)
MCH: 28.9 pg (ref 26.0–34.0)
MCHC: 32.9 g/dL (ref 30.0–36.0)
MCV: 88 fL (ref 80.0–100.0)
Platelets: 323 10*3/uL (ref 150–400)
RBC: 4.91 MIL/uL (ref 3.87–5.11)
RDW: 13.6 % (ref 11.5–15.5)
WBC: 11.6 10*3/uL — ABNORMAL HIGH (ref 4.0–10.5)
nRBC: 0 % (ref 0.0–0.2)

## 2021-10-01 LAB — BASIC METABOLIC PANEL
Anion gap: 11 (ref 5–15)
BUN: 22 mg/dL (ref 8–23)
CO2: 22 mmol/L (ref 22–32)
Calcium: 9.7 mg/dL (ref 8.9–10.3)
Chloride: 104 mmol/L (ref 98–111)
Creatinine, Ser: 1.01 mg/dL — ABNORMAL HIGH (ref 0.44–1.00)
GFR, Estimated: 56 mL/min — ABNORMAL LOW (ref >60)
Glucose, Bld: 224 mg/dL — ABNORMAL HIGH (ref 70–99)
Potassium: 4 mmol/L (ref 3.5–5.1)
Sodium: 137 mmol/L (ref 135–145)

## 2021-10-01 LAB — GLUCOSE, CAPILLARY
Glucose-Capillary: 233 mg/dL — ABNORMAL HIGH (ref 70–99)
Glucose-Capillary: 341 mg/dL — ABNORMAL HIGH (ref 70–99)
Glucose-Capillary: 285 mg/dL — ABNORMAL HIGH (ref 70–99)
Glucose-Capillary: 321 mg/dL — ABNORMAL HIGH (ref 70–99)

## 2021-10-01 MED ORDER — INSULIN GLARGINE-YFGN 100 UNIT/ML ~~LOC~~ SOLN
30.0000 [IU] | Freq: Two times a day (BID) | SUBCUTANEOUS | Status: DC
  Filled 2021-10-01 (×2): qty 0.3

## 2021-10-01 MED ORDER — METOPROLOL SUCCINATE ER 25 MG PO TB24
12.5000 mg | ORAL_TABLET | Freq: Every day | ORAL | Status: DC
  Administered 2021-10-01 – 2021-10-03 (×3): 12.5 mg via ORAL
  Filled 2021-10-01 (×3): qty 1

## 2021-10-01 MED ORDER — FLUTICASONE PROPIONATE 50 MCG/ACT NA SUSP
1.0000 | Freq: Every day | NASAL | Status: DC
  Administered 2021-10-01 – 2021-10-02 (×2): 1 via NASAL
  Filled 2021-10-01: qty 16

## 2021-10-01 MED ORDER — CARVEDILOL 3.125 MG PO TABS: 3.1250 mg | ORAL_TABLET | Freq: Two times a day (BID) | ORAL | Status: DC

## 2021-10-01 MED ORDER — ROSUVASTATIN CALCIUM 20 MG PO TABS
40.0000 mg | ORAL_TABLET | Freq: Every morning | ORAL | Status: DC
  Administered 2021-10-01 – 2021-10-03 (×3): 40 mg via ORAL
  Filled 2021-10-01 (×3): qty 2

## 2021-10-01 MED ORDER — ALBUTEROL SULFATE (2.5 MG/3ML) 0.083% IN NEBU: 2.5000 mg | INHALATION_SOLUTION | RESPIRATORY_TRACT | Status: DC | PRN

## 2021-10-01 MED ORDER — INSULIN GLARGINE-YFGN 100 UNIT/ML ~~LOC~~ SOLN
10.0000 [IU] | Freq: Three times a day (TID) | SUBCUTANEOUS | Status: DC
  Administered 2021-10-01: 10 [IU] via SUBCUTANEOUS
  Filled 2021-10-01 (×3): qty 0.1

## 2021-10-01 MED ORDER — SERTRALINE HCL 100 MG PO TABS
100.0000 mg | ORAL_TABLET | Freq: Every morning | ORAL | Status: DC
  Administered 2021-10-01 – 2021-10-03 (×3): 100 mg via ORAL
  Filled 2021-10-01 (×3): qty 1

## 2021-10-01 MED ORDER — INSULIN GLARGINE-YFGN 100 UNIT/ML ~~LOC~~ SOLN
10.0000 [IU] | Freq: Two times a day (BID) | SUBCUTANEOUS | Status: DC
  Administered 2021-10-01: 10 [IU] via SUBCUTANEOUS
  Filled 2021-10-01 (×3): qty 0.1

## 2021-10-01 MED ORDER — ISOSORBIDE MONONITRATE ER 60 MG PO TB24
60.0000 mg | ORAL_TABLET | Freq: Every morning | ORAL | Status: DC
  Administered 2021-10-01 – 2021-10-03 (×3): 60 mg via ORAL
  Filled 2021-10-01 (×3): qty 1

## 2021-10-01 MED ORDER — EZETIMIBE 10 MG PO TABS
10.0000 mg | ORAL_TABLET | Freq: Every morning | ORAL | Status: DC
  Administered 2021-10-01 – 2021-10-03 (×3): 10 mg via ORAL
  Filled 2021-10-01 (×3): qty 1

## 2021-10-01 MED ORDER — ASPIRIN 81 MG PO TBEC
81.0000 mg | DELAYED_RELEASE_TABLET | Freq: Every morning | ORAL | Status: DC
  Administered 2021-10-01 – 2021-10-03 (×3): 81 mg via ORAL
  Filled 2021-10-01 (×3): qty 1

## 2021-10-01 MED ORDER — AZELASTINE HCL 0.1 % NA SOLN: 2.0000 | Freq: Two times a day (BID) | NASAL | Status: DC | PRN

## 2021-10-01 NOTE — Progress Notes (Signed)
Palliative: Chart review completed.  Discussion with transition of care team related to patient condition and needs/disposition.  Conference with attending, bedside nursing staff, transition of care team related to patient condition, needs, goals of care, disposition.  Plan: Return to [facility] with outpatient palliative services through [facility] the [facility], home health services.  No charge Quinn Axe, NP Palliative medicine team Team phone [phone] Greater than 50% of this time was spent counseling and coordinating care related to the above assessment and plan.

## 2021-10-01 NOTE — Hospital Course (Signed)
[name] is a 81 y.o. female with medical history significant of hypertension, hyperlipidemia, insulin-dependent diabetes, asthma, OSA, PVD, history of breast cancer presenting to the ED via EMS for evaluation of dyspnea on exertion and chest pain.

## 2021-10-01 NOTE — Evaluation (Signed)
Physical Therapy Evaluation Patient Details Name: [name] MRN: [id number] DOB: Sep 28, 1940 Today's Date: 10/01/2021  History of Present Illness  81 y.o. female admitted on 09/29/2021 with dyspnea on exertion, chest pain/acute hypoxia, abnormal breast finding.  Treatment for Acute hypoxic respiratory failure due to acute diastolic CHF PMH: HTN/HLD, IDDM, asthma, OSA/obesity, PVD, history of breast cancer left 1992 right 2007  Clinical Impression  PTA pt living in ALF, ambulating with Rollator and independent in bathing and dressing. Facility provides for iADLs. Pt is currently limited in safe mobility by increased O2 demand, training of purse lip breathing with supplemental O2, in presence of decreased strength and endurance. PT recommending HHPT at discharge to regain PLOF. PT will continue to follow acutely.       Recommendations for follow up therapy are one component of a multi-disciplinary discharge planning process, led by the attending physician.  Recommendations may be updated based on patient status, additional functional criteria and insurance authorization.  Follow Up Recommendations Home health PT    Assistance Recommended at Discharge Intermittent Supervision/Assistance  Patient can return home with the following  Assistance with cooking/housework;Assist for transportation;Help with stairs or ramp for entrance;A little help with bathing/dressing/bathroom;A little help with walking and/or transfers    Equipment Recommendations None recommended by PT  Recommendations for Other Services       Functional Status Assessment Patient has had a recent decline in their functional status and demonstrates the ability to make significant improvements in function in a reasonable and predictable amount of time.     Precautions / Restrictions Precautions Precautions: None Restrictions Weight Bearing Restrictions: No      Mobility  Bed Mobility Overal bed mobility: Needs  Assistance Bed Mobility: Supine to Sit     Supine to sit: Supervision, HOB elevated     General bed mobility comments: increased time and effort and use of bedrail to come to EoB    Transfers Overall transfer level: Needs assistance Equipment used: Rolling walker (2 wheels) Transfers: Sit to/from Stand Sit to Stand: Min guard           General transfer comment: good hand placement for power up, able to stand and pull up brief with min A    Ambulation/Gait Ambulation/Gait assistance: Min assist, Min guard Gait Distance (Feet): 150 Feet Assistive device: Rolling walker (2 wheels) Gait Pattern/deviations: Step-through pattern, Knee hyperextension - right, Trunk flexed Gait velocity: slowed Gait velocity interpretation: <1.31 ft/sec, indicative of household ambulator   General Gait Details: pt reports history of R knee buckling so provided min A initially, however able to progress to min guard for safety, pt utilizes R knee hyperextension to maintain R knee stability in stance phase     Balance Overall balance assessment: Needs assistance Sitting-balance support: Feet supported, No upper extremity supported Sitting balance-Leahy Scale: Good Sitting balance - Comments: able to reach down to her feet in sitting   Standing balance support: During functional activity, No upper extremity supported Standing balance-Leahy Scale: Fair Standing balance comment: able to stand and pull up mesh panties with no outside support, requires assist to get them in place once up                             Pertinent Vitals/Pain Pain Assessment Pain Assessment: Faces Faces Pain Scale: Hurts a little bit Pain Location: R knee with ambulation Pain Descriptors / Indicators: Sore, Grimacing, Guarding Pain Intervention(s): Limited activity within patient's tolerance,  Monitored during session, Repositioned    Home Living Family/patient expects to be discharged to:: Assisted  living                 Home Equipment: Rollator (4 wheels) Additional Comments: facility provides meals, laundry and cleaning    Prior Function Prior Level of Function : Independent/Modified Independent             Mobility Comments: uses Rollator for ambulation ADLs Comments: independent in bathing and dressing     Hand Dominance        Extremity/Trunk Assessment   Upper Extremity Assessment Upper Extremity Assessment: Generalized weakness    Lower Extremity Assessment Lower Extremity Assessment: RLE deficits/detail;Generalized weakness RLE Deficits / Details: R knee lacking full ext/flex, strength 3+/5       Communication   Communication: No difficulties  Cognition Arousal/Alertness: Awake/alert Behavior During Therapy: WFL for tasks assessed/performed Overall Cognitive Status: Within Functional Limits for tasks assessed                                          General Comments General comments (skin integrity, edema, etc.): Pt on 2 L O2 in supine with SpO2 96%O2, removed and able to maintain SpO2 90%O2, with coming to EoB SpO2 drops to 87%O2 able to recover to 90%O2 with purse lip breathing, however with ambulation SpO2 on RA drops to 86%O2, returned 2L O2 via [place] and pt able to maintain SpO2 >92%O2 with ambulation, with return to seated in recliner removed supplemental O2 and pt able to maintain SpO2 92%O2, HR with ambulation in 110s        Assessment/Plan    PT Assessment Patient needs continued PT services  PT Problem List Decreased activity tolerance;Decreased mobility;Cardiopulmonary status limiting activity       PT Treatment Interventions Gait training;Functional mobility training;Therapeutic activities;Therapeutic exercise;Balance training;Cognitive remediation;Patient/family education    PT Goals (Current goals can be found in the Care Plan section)  Acute Rehab PT Goals Patient Stated Goal: get back to needlepoint class PT  Goal Formulation: With patient Time For Goal Achievement: 10/15/21 Potential to Achieve Goals: Good    Frequency Min 3X/week        AM-PAC PT "6 Clicks" Mobility  Outcome Measure Help needed turning from your back to your side while in a flat bed without using bedrails?: None Help needed moving from lying on your back to sitting on the side of a flat bed without using bedrails?: None Help needed moving to and from a bed to a chair (including a wheelchair)?: A Little Help needed standing up from a chair using your arms (e.g., wheelchair or bedside chair)?: A Little Help needed to walk in hospital room?: A Little Help needed climbing 3-5 steps with a railing? : Total 6 Click Score: 18    End of Session Equipment Utilized During Treatment: Gait belt;Oxygen Activity Tolerance: Patient tolerated treatment well Patient left: in chair;with call bell/phone within reach;with chair alarm set Nurse Communication: Mobility status PT Visit Diagnosis: Other abnormalities of gait and mobility (R26.89);Difficulty in walking, not elsewhere classified (R26.2);Muscle weakness (generalized) (M62.81)    Time: 1005-1036 PT Time Calculation (min) (ACUTE ONLY): 31 min   Charges:   PT Evaluation $PT Eval Moderate Complexity: 1 Mod PT Treatments $Therapeutic Exercise: 8-22 mins        Gedalya Jim B. Migdalia Dk PT, DPT Acute Rehabilitation Services Please use  secure chat or  Call Office [phone]   [place] 10/01/2021, 10:56 AM

## 2021-10-01 NOTE — Progress Notes (Addendum)
PROGRESS NOTE    [name]  SEG:[id number] DOB: Dec 27, 1940 DOA: 09/29/2021 PCP: Merryl Hacker, No    Brief Narrative:  [name] is a 81 y.o. female with medical history significant of hypertension, hyperlipidemia, insulin-dependent diabetes, asthma, OSA, PVD, history of breast cancer presenting to the ED via EMS for evaluation of dyspnea on exertion and chest pain.     Assessment and Plan: Dyspnea on exertion/Acute hypoxic respiratory failure due to acute diastolic CHF Patient desatted to mid 80s on room air in the ED -wean O2 as able  ACS less likely as high-sensitivity troponin negative x2.  -EKG showing sinus rhythm (1 ekg in ER was ? A fib).  per ER doctor: CT scan does not show any evidence of PE.  BNP 212    Previous echo done in March 2020 showing normal LVEF of 60 to 73% and diastolic parameters with pseudonormalization. -IV Lasix 40 mg- daily -Echocardiogram shows grade 1 diastolic dsfxn -home o2 eval -down 2L if I/Os correct -PT eval (from ALF) -add BB   Abnormal breast findings on imaging Patient with history of breast cancer several years ago and no longer on treatment.  She denies any breast skin changes or lumps.  CTA chest showing findings suggestive of marked severity right-sided mastitis with a partially calcified soft tissue lesion seen within the lower outer quadrant of the right breast.  The presence of an underlying neoplastic process cannot be excluded.   -I have discussed CT findings with the patient and family-- son says she had several follow up visist regarding this mass that she cancelled (had seen Dr. Griffith Citron per family) -Outpatient mammography needed for further evaluation. -add IV rocephin (? Mastitis with elevated WBC)   Abdominal hernia CT showing a large ventral hernia along the midline upper abdomen containing fat and a segment of normal-appearing transverse colon.  No abdominal tenderness on exam.  Patient is aware of history of abdominal  hernia. -Outpatient general surgery follow-up.   Insulin-dependent diabetes Poorly controlled - blood glucose 262, no signs of DKA. -Moderate sliding scale insulin ACHS -Resume home basal insulin per DM coordinators recommendations    OSA -Continue nightly CPAP  Palliative care consult for GOC     DVT prophylaxis: enoxaparin (LOVENOX) injection 40 mg Start: 09/30/21 1000    Code Status: Full Code Family Communication: called son  Disposition Plan:  Level of care: Telemetry Cardiac Status is: Inpatient Remains inpatient appropriate because: needs further diuresis    Consultants:  none   Subjective: Thinks breathing is better  Objective: Vitals:   09/30/21 1755 09/30/21 2034 09/30/21 2332 10/01/21 0405  BP:  (!) 146/71 133/63 125/89  Pulse:  78 66 81  Resp:  (!) 25 (!) 24 20  Temp:  98.6 F (37 C) 97.9 F (36.6 C) 98 F (36.7 C)  TempSrc:  Oral Oral Oral  SpO2: 92% 90% 92% 91%    Intake/Output Summary (Last 24 hours) at 10/01/2021 0805 Last data filed at 10/01/2021 0405 Gross per 24 hour  Intake 220 ml  Output 2300 ml  Net -2080 ml   There were no vitals filed for this visit.  Examination:   General: Appearance:    Obese female in no acute distress     Lungs:     Crackles at bases, respirations unlabored, on [place]  Heart:    Normal heart rate. Normal rhythm. No murmurs, rubs, or gallops.    MS:   All extremities are intact.    Neurologic:  Awake, alert, pleasant and cooperative       Data Reviewed: I have personally reviewed following labs and imaging studies  CBC: Recent Labs  Lab 09/29/21 2059 10/01/21 0126  WBC 12.4* 11.6*  NEUTROABS 7.4  --   HGB 14.0 14.2  HCT 43.3 43.2  MCV 89.6 88.0  PLT 328 174   Basic Metabolic Panel: Recent Labs  Lab 09/29/21 2043 09/29/21 2254 09/30/21 0602 10/01/21 0126  NA 135  --  139 137  K 5.8* 4.1 3.8 4.0  CL 107  --  107 104  CO2 24  --  22 22  GLUCOSE 262*  --  129* 224*  BUN 19  --  17 22   CREATININE 0.93  --  0.94 1.01*  CALCIUM 9.2  --  9.6 9.7   GFR: CrCl cannot be calculated (Unknown ideal weight.). Liver Function Tests: No results for input(s): AST, ALT, ALKPHOS, BILITOT, PROT, ALBUMIN in the last 168 hours. No results for input(s): LIPASE, AMYLASE in the last 168 hours. No results for input(s): AMMONIA in the last 168 hours. Coagulation Profile: No results for input(s): INR, PROTIME in the last 168 hours. Cardiac Enzymes: No results for input(s): CKTOTAL, CKMB, CKMBINDEX, TROPONINI in the last 168 hours. BNP (last 3 results) No results for input(s): PROBNP in the last 8760 hours. HbA1C: Recent Labs    09/30/21 0602  HGBA1C 9.8*   CBG: Recent Labs  Lab 09/30/21 0617 09/30/21 1204 09/30/21 1626 09/30/21 2132 10/01/21 0630  GLUCAP 119* 248* 203* 281* 233*   Lipid Profile: No results for input(s): CHOL, HDL, LDLCALC, TRIG, CHOLHDL, LDLDIRECT in the last 72 hours. Thyroid Function Tests: No results for input(s): TSH, T4TOTAL, FREET4, T3FREE, THYROIDAB in the last 72 hours. Anemia Panel: No results for input(s): VITAMINB12, FOLATE, FERRITIN, TIBC, IRON, RETICCTPCT in the last 72 hours. Sepsis Labs: No results for input(s): PROCALCITON, LATICACIDVEN in the last 168 hours.  Recent Results (from the past 240 hour(s))  Resp Panel by RT-PCR (Flu A&B, Covid) Nasopharyngeal Swab     Status: None   Collection Time: 09/29/21  7:58 PM   Specimen: Nasopharyngeal Swab; Nasopharyngeal(NP) swabs in vial transport medium  Result Value Ref Range Status   SARS Coronavirus 2 by RT PCR NEGATIVE NEGATIVE Final    Comment: (NOTE) SARS-CoV-2 target nucleic acids are NOT DETECTED.  The SARS-CoV-2 RNA is generally detectable in upper respiratory specimens during the acute phase of infection. The lowest concentration of SARS-CoV-2 viral copies this assay can detect is 138 copies/mL. A negative result does not preclude SARS-Cov-2 infection and should not be used as the  sole basis for treatment or other patient management decisions. A negative result may occur with  improper specimen collection/handling, submission of specimen other than nasopharyngeal swab, presence of viral mutation(s) within the areas targeted by this assay, and inadequate number of viral copies(<138 copies/mL). A negative result must be combined with clinical observations, patient history, and epidemiological information. The expected result is Negative.  Fact Sheet for Patients:  EntrepreneurPulse.com.au  Fact Sheet for Healthcare Providers:  IncredibleEmployment.be  This test is no t yet approved or cleared by the [place] FDA and  has been authorized for detection and/or diagnosis of SARS-CoV-2 by FDA under an Emergency Use Authorization (EUA). This EUA will remain  in effect (meaning this test can be used) for the duration of the COVID-19 declaration under Section 564(b)(1) of the Act, 21 U.S.C.section 360bbb-3(b)(1), unless the authorization is terminated  or revoked sooner.  Influenza A by PCR NEGATIVE NEGATIVE Final   Influenza B by PCR NEGATIVE NEGATIVE Final    Comment: (NOTE) The Xpert Xpress SARS-CoV-2/FLU/RSV plus assay is intended as an aid in the diagnosis of influenza from Nasopharyngeal swab specimens and should not be used as a sole basis for treatment. Nasal washings and aspirates are unacceptable for Xpert Xpress SARS-CoV-2/FLU/RSV testing.  Fact Sheet for Patients: EntrepreneurPulse.com.au  Fact Sheet for Healthcare Providers: IncredibleEmployment.be  This test is not yet approved or cleared by the [place] FDA and has been authorized for detection and/or diagnosis of SARS-CoV-2 by FDA under an Emergency Use Authorization (EUA). This EUA will remain in effect (meaning this test can be used) for the duration of the COVID-19 declaration under Section 564(b)(1) of the  Act, 21 U.S.C. section 360bbb-3(b)(1), unless the authorization is terminated or revoked.  Performed at [facility], [place] [address], [place], [place] [address]          Radiology Studies: CT Angio Chest PE W/Cm &/Or Wo Cm  Result Date: 09/29/2021 CLINICAL DATA:  Shortness of breath with exertion. EXAM: CT ANGIOGRAPHY CHEST WITH CONTRAST TECHNIQUE: Multidetector CT imaging of the chest was performed using the standard protocol during bolus administration of intravenous contrast. Multiplanar CT image reconstructions and MIPs were obtained to evaluate the vascular anatomy. RADIATION DOSE REDUCTION: This exam was performed according to the departmental dose-optimization program which includes automated exposure control, adjustment of the mA and/or kV according to patient size and/or use of iterative reconstruction technique. CONTRAST:  23m OMNIPAQUE IOHEXOL 300 MG/ML  SOLN COMPARISON:  August 04, 2005 FINDINGS: Cardiovascular: There is marked severity calcification of the thoracic aorta, without evidence of aortic aneurysm or dissection. Satisfactory opacification of the pulmonary arteries to the segmental level. No evidence of pulmonary embolism. Normal heart size with marked severity coronary artery calcification. No pericardial effusion. Mediastinum/Nodes: No enlarged mediastinal, hilar, or axillary lymph nodes. Thyroid gland, trachea, and esophagus demonstrate no significant findings. Lungs/Pleura: Mild scarring and/or atelectasis is seen within the lingular region. There is no evidence of acute infiltrate, pleural effusion or pneumothorax. Upper Abdomen: No acute abnormality. Musculoskeletal: A 9.8 cm x 4.1 cm ventral hernia is seen along the midline the upper abdomen. This contains fat and a segment of normal appearing transverse colon. Marked severity cutaneous thickening is seen throughout the right breast. Associated subcutaneous inflammatory fat stranding is noted. A 4.0 cm x 1.7 cm  ill-defined lobulated, partially calcified soft tissue lesion is seen within the lower outer quadrant of the right breast. Multilevel degenerative changes seen throughout the thoracic spine. Review of the MIP images confirms the above findings. IMPRESSION: 1. Mild lingular scarring and/or atelectasis 2. Findings suggestive of marked severity right-sided mastitis with a partially calcified soft tissue lesion seen within the lower outer quadrant of the right breast. The presence of an underlying neoplastic process cannot be excluded. Correlation with mammography is recommended. 3. Large ventral hernia along the midline the upper abdomen containing fat and a segment of normal appearing transverse colon. Aortic Atherosclerosis (ICD10-I70.0). Electronically Signed   By: TVirgina NorfolkM.D.   On: 09/29/2021 23:47   DG Chest Port 1 View  Result Date: 09/29/2021 CLINICAL DATA:  Shortness of breath EXAM: PORTABLE CHEST 1 VIEW COMPARISON:  05/07/2019 FINDINGS: Transverse diameter of heart is increased. Central pulmonary vessels are more prominent. There are no signs of alveolar pulmonary edema. Linear densities seen in both lower lung fields. There is no significant pleural effusion. Patient's chin is  partly obscuring the apices. As far as seen, there is no definite demonstrable pneumothorax. Surgical clips are seen in the left chest wall. IMPRESSION: Cardiomegaly. Central pulmonary vessels are more prominent which may be due to semi upright position or suggest mild CHF. Increased interstitial markings are seen in both lower lung fields suggesting scarring or interstitial edema or interstitial pneumonia. There is no focal pulmonary consolidation. Electronically Signed   By: Elmer Picker M.D.   On: 09/29/2021 20:19   ECHOCARDIOGRAM COMPLETE  Result Date: 09/30/2021    ECHOCARDIOGRAM REPORT   Patient Name:   [name] Date of Exam: 09/30/2021 Medical Rec #:  [id number]          Height:       66.0 in  Accession #:    [id number]         Weight:       201.0 lb Date of Birth:  March 19, 1941           BSA:          2.004 m Patient Age:    47 years           BP:           145/48 mmHg Patient Gender: F                  HR:           67 bpm. Exam Location:  Inpatient Procedure: 2D Echo, Cardiac Doppler, Color Doppler and Intracardiac            Opacification Agent Indications:    CHF  History:        Patient has prior history of Echocardiogram examinations, most                 recent 07/20/2018. Risk Factors:Diabetes and Hypertension.  Sonographer:    Jefferey Pica Referring Phys: [id number] RATHORE  Sonographer Comments: Image acquisition challenging due to patient body habitus. IMPRESSIONS  1. Left ventricular ejection fraction, by estimation, is 65 to 70%. The left ventricle has normal function. The left ventricle has no regional wall motion abnormalities. There is mild left ventricular hypertrophy. Left ventricular diastolic parameters are consistent with Grade I diastolic dysfunction (impaired relaxation).  2. Right ventricular systolic function is normal. The right ventricular size is normal. Tricuspid regurgitation signal is inadequate for assessing PA pressure.  3. The mitral valve is abnormal. Trivial mitral valve regurgitation.  4. The aortic valve was not well visualized. Aortic valve regurgitation is not visualized.  5. The inferior vena cava is normal in size with <50% respiratory variability, suggesting right atrial pressure of 8 mmHg. Comparison(s): Changes from prior study are noted. 07/20/2018: LVEF 60-65%, grade 2 DD. FINDINGS  Left Ventricle: Left ventricular ejection fraction, by estimation, is 65 to 70%. The left ventricle has normal function. The left ventricle has no regional wall motion abnormalities. Definity contrast agent was given IV to delineate the left ventricular  endocardial borders. The left ventricular internal cavity size was normal in size. There is mild left ventricular  hypertrophy. Left ventricular diastolic parameters are consistent with Grade I diastolic dysfunction (impaired relaxation). Indeterminate filling pressures. Right Ventricle: The right ventricular size is normal. No increase in right ventricular wall thickness. Right ventricular systolic function is normal. Tricuspid regurgitation signal is inadequate for assessing PA pressure. Left Atrium: Left atrial size was normal in size. Right Atrium: Right atrial size was normal in size. Pericardium: There is no evidence of pericardial effusion. Mitral  Valve: The mitral valve is abnormal. Mild to moderate mitral annular calcification. Trivial mitral valve regurgitation. Tricuspid Valve: The tricuspid valve is grossly normal. Tricuspid valve regurgitation is trivial. Aortic Valve: The aortic valve was not well visualized. Aortic valve regurgitation is not visualized. Aortic valve peak gradient measures 15.7 mmHg. Pulmonic Valve: The pulmonic valve was grossly normal. Pulmonic valve regurgitation is trivial. Aorta: The aortic root and ascending aorta are structurally normal, with no evidence of dilitation. Venous: The inferior vena cava is normal in size with less than 50% respiratory variability, suggesting right atrial pressure of 8 mmHg. IAS/Shunts: No atrial level shunt detected by color flow Doppler.  LEFT VENTRICLE PLAX 2D LVIDd:         3.90 cm   Diastology LVIDs:         2.60 cm   LV e' medial:    5.43 cm/s LV PW:         1.10 cm   LV E/e' medial:  16.8 LV IVS:        1.10 cm   LV e' lateral:   12.00 cm/s LVOT diam:     1.90 cm   LV E/e' lateral: 7.6 LV SV:         77 LV SV Index:   38 LVOT Area:     2.84 cm  IVC IVC diam: 2.00 cm LEFT ATRIUM             Index        RIGHT ATRIUM           Index LA diam:        4.90 cm 2.45 cm/m   RA Area:     17.90 cm LA Vol (A2C):   69.4 ml 34.63 ml/m  RA Volume:   50.80 ml  25.35 ml/m LA Vol (A4C):   46.0 ml 22.95 ml/m LA Biplane Vol: 58.5 ml 29.19 ml/m  AORTIC VALVE                  PULMONIC VALVE AV Area (Vmax): 1.62 cm     PV Vmax:       0.76 m/s AV Vmax:        198.00 cm/s  PV Peak grad:  2.3 mmHg AV Peak Grad:   15.7 mmHg LVOT Vmax:      113.00 cm/s LVOT Vmean:     76.300 cm/s LVOT VTI:       0.270 m  AORTA Ao Asc diam: 3.10 cm MITRAL VALVE MV Area (PHT): 2.15 cm     SHUNTS MV Decel Time: 353 msec     Systemic VTI:  0.27 m MV E velocity: 91.00 cm/s   Systemic Diam: 1.90 cm MV A velocity: 135.00 cm/s MV E/A ratio:  0.67 Lyman Bishop MD Electronically signed by Lyman Bishop MD Signature Date/Time: 09/30/2021/11:49:00 AM    Final         Scheduled Meds:  aspirin EC  81 mg Oral q morning   enoxaparin (LOVENOX) injection  40 mg Subcutaneous Q24H   ezetimibe  10 mg Oral q morning   fluticasone  1 spray Each Nare Daily   furosemide  40 mg Intravenous Daily   insulin aspart  0-15 Units Subcutaneous TID WC   insulin aspart  0-5 Units Subcutaneous QHS   insulin glargine-yfgn  10 Units Subcutaneous TID   isosorbide mononitrate  60 mg Oral q morning   rosuvastatin  40 mg Oral q morning   sertraline  100 mg  Oral q morning   Continuous Infusions:  cefTRIAXone (ROCEPHIN)  IV Stopped (09/30/21 1146)     LOS: 1 day    Time spent: 45 minutes spent on chart review, discussion with nursing staff, consultants, updating family and interview/physical exam; more than 50% of that time was spent in counseling and/or coordination of care.    Geradine Girt, DO [facility] Available via Epic secure chat 7am-7pm After these hours, please refer to coverage provider listed on [email or web address] 10/01/2021, 8:05 AM

## 2021-10-01 NOTE — Progress Notes (Signed)
SATURATION QUALIFICATIONS: (This note is used to comply with regulatory documentation for home oxygen)  Patient Saturations on Room Air at Rest = 90%  Patient Saturations on Room Air while Ambulating = 86%  Patient Saturations on 2 Liters of oxygen while Ambulating = 92%  Please briefly explain why patient needs home oxygen: Pt requires supplemental O2 during ambulation to decreased DoE and maintain SpO2 greater than 90%O2.  Deandre Brannan B. Migdalia Dk PT, DPT Acute Rehabilitation Services Please use secure chat or  Call Office [phone]

## 2021-10-02 DIAGNOSIS — R0609 Other forms of dyspnea: Secondary | ICD-10-CM | POA: Diagnosis not present

## 2021-10-02 LAB — BASIC METABOLIC PANEL
Anion gap: 11 (ref 5–15)
BUN: 33 mg/dL — ABNORMAL HIGH (ref 8–23)
CO2: 23 mmol/L (ref 22–32)
Calcium: 9.7 mg/dL (ref 8.9–10.3)
Chloride: 100 mmol/L (ref 98–111)
Creatinine, Ser: 1.4 mg/dL — ABNORMAL HIGH (ref 0.44–1.00)
GFR, Estimated: 38 mL/min — ABNORMAL LOW (ref >60)
Glucose, Bld: 236 mg/dL — ABNORMAL HIGH (ref 70–99)
Potassium: 4.3 mmol/L (ref 3.5–5.1)
Sodium: 134 mmol/L — ABNORMAL LOW (ref 135–145)

## 2021-10-02 LAB — GLUCOSE, CAPILLARY
Glucose-Capillary: 232 mg/dL — ABNORMAL HIGH (ref 70–99)
Glucose-Capillary: 319 mg/dL — ABNORMAL HIGH (ref 70–99)
Glucose-Capillary: 309 mg/dL — ABNORMAL HIGH (ref 70–99)
Glucose-Capillary: 251 mg/dL — ABNORMAL HIGH (ref 70–99)
Glucose-Capillary: 319 mg/dL — ABNORMAL HIGH (ref 70–99)

## 2021-10-02 MED ORDER — UMECLIDINIUM-VILANTEROL 62.5-25 MCG/ACT IN AEPB
1.0000 | INHALATION_SPRAY | Freq: Every day | RESPIRATORY_TRACT | Status: DC
  Administered 2021-10-02 – 2021-10-03 (×2): 1 via RESPIRATORY_TRACT
  Filled 2021-10-02: qty 14

## 2021-10-02 MED ORDER — INSULIN GLARGINE-YFGN 100 UNIT/ML ~~LOC~~ SOLN
20.0000 [IU] | Freq: Two times a day (BID) | SUBCUTANEOUS | Status: DC
  Administered 2021-10-02 – 2021-10-03 (×3): 20 [IU] via SUBCUTANEOUS
  Filled 2021-10-02 (×4): qty 0.2

## 2021-10-02 NOTE — TOC Progression Note (Signed)
Transition of Care [facility]) - Progression Note    Patient Details  Name: [name] MRN: [id number] Date of Birth: [birth year]  [facility] CM/SW [place], LCSW Phone Number: 10/02/2021, 1:30 PM  Clinical Narrative:     CSW attempted to call [facility] to follow up on impending DC tomorrow as well as pharmacy utilized. CSW was unable to reach anyone.  TOC team will continue to assist with discharge planning needs.    Expected Discharge Plan: Assisted Living Barriers to Discharge: Continued Medical Work up  Expected Discharge Plan and Services Expected Discharge Plan: Assisted Living     Post Acute Care Choice: Resumption of Svcs/PTA Provider Living arrangements for the past 2 months: Assisted Living Facility                                       Social Determinants of Health (SDOH) Interventions    Readmission Risk Interventions     View : No data to display.

## 2021-10-02 NOTE — Progress Notes (Addendum)
PROGRESS NOTE    [name]  ZTI:[id number] DOB: 10-Aug-1940 DOA: 09/29/2021 PCP: Merryl Hacker, No    Brief Narrative:   [name] is a 81 y.o. female with past medical history significant for is an 81 year old female with past medical history significant for HTN, HLD, DM2, OSA on CPAP, COPD, history of breast cancer, chronic diastolic congestive heart failure who presented to [facility] ED on 5/24 from [facility] with progressive shortness of breath.  Reports worse with exertion.  States previously was on 24-hour home oxygen but currently only requiring CPAP at night.  Did report episode of chest pain day prior to admission that she was carrying some boxes into her house lasting roughly 1 minute with associated dyspnea but no diaphoresis or nausea.  In the ED, patient was tachycardic and hypoxic with desaturation to the mid 80s on room air.  She was placed on 3 L nasal cannula.  WBC 12.4, hemoglobin 14.0, platelet count 328.  Sodium 135, potassium 5.8, chloride 107, bicarb 24, BUN 19, creatinine 0.9.  Glucose 292.  COVID-19 PCR negative.  Influenza A/B PCR negative.  High sensitive troponin negative x2.  BNP 212.  CT angiogram chest with mild lingular scarring and/or atelectasis, marked severity right-sided mastitis with partially calcified soft tissue lesion seen within the lower outer quadrant of the right breast, large ventral hernia, aortic atherosclerosis.  Patient was given IV Lasix 40 mg x 1 by EDP.  Hospitalist service consulted for further evaluation management of acute respiratory failure likely secondary to CHF exacerbation.  Assessment & Plan:   Acute hypoxic respiratory failure, POA Etiology likely multifactorial, patient with underlying COPD and has been off her home inhalers for some time as she did not get any refills, also with mild volume overload in the setting of diastolic congestive heart failure exacerbation with elevated BNP and vascular congestion noted on imaging.  Patient  previously was oxygen dependent but has been off for some time. --Continue supplemental oxygen, maintain SPO2 greater than 88% --Treatment as below for COPD, diastolic CHF --Repeat ambulatory O2 screen today  Acute on chronic diastolic congestive heart failure exacerbation Patient presenting with progressive shortness of breath, noted to be hypoxic on ED arrival requiring 3 L nasal cannula.  BNP elevated to 12 with vascular congestion noted on imaging studies.  TTE with LVEF 65-70%, no LV regional wall motion normalities, grade 1 diastolic dysfunction, trivial MR, IVC normal. --Discontinue IV Lasix today due to bump in creatinine --net negative 670m past 24h, net negative 2.7L since admission --Strict I's and O's and daily weights --Repeat BMP in a.m.  Acute renal failure Etiology likely secondary to overdiuresis. --Cr 0.9>1.01>1.40 --Holding IV Lasix as above --Repeat BMP in a.m.  COPD Patient reports previous history of extensive tobacco abuse, quit for many years.  Follows with pulmonology outpatient; although has not been seen for some time.  Previously was oxygen dependent now only utilizes CPAP at nighttime.  Also states that she was previously on an inhaler ([name]; but has been off for some time as she never had any refills. --Start Anora Ellipta 62.5-25 mcg 1 puff daily --Albuterol neb as needed shortness of breath/wheezing --Titrate supplemental oxygen to maintain SPO2 >/= 88%  HTN --Metoprolol succinate 12.5 mg p.o. daily --Isosorbide mononitrate 60 mg p.o. every morning --Continue aspirin and statin  HLD --Crestor 40 mg p.o. daily  DM2 with hyperglycemia Hemoglobin A1c 9.8, poorly controlled.  Home regimen includes Tresiba 50 units [place] BID (25u BID if BG <100) and  NovoLog insulin sliding scale. --Semglee 20 units [place] BID --SSI for coverage --CBGs qAC/HS  History of breast cancer Mastitis Patient with history of breast cancer several years ago and no longer on  treatment.  CTA chest shows findings suggestive of marked severity right-sided mastitis with a partially calcified soft tissue lesion within the lower outer quadrant of the right breast, presence of underlying neoplastic process cannot be excluded.  CT findings have been discussed with family and patient; son reports that she had several follow-up appointments regarding this mass that she canceled.  Has been seen by Dr. Jana Hakim in the past.  Recommend outpatient mammography as needed for further evaluation and reengaging medical oncology if desires aggressive treatment.  During hospitalization patient was seen by palliative care; remains full code but does agree to outpatient palliative care to follow on discharge --Ceftriaxone 1 g IV every 24 hours  OSA --Continue nocturnal CPAP  Abdominal hernia CT notable for a large ventral hernia along the midline upper abdomen containing fat and a segment of normal-appearing transverse colon.  Nontender on exam and patient aware of history of abdominal hernia.  Recommend outpatient follow-up with general surgery as needed.   DVT prophylaxis: enoxaparin (LOVENOX) injection 40 mg Start: 09/30/21 1000    Code Status: Full Code Family Communication: Updated patient's son, John via telephone this morning  Disposition Plan:  Level of care: Telemetry Cardiac Status is: Inpatient Remains inpatient appropriate because: IV antibiotics, titrating off of oxygen, will need further observation now bump in creatinine, anticipate discharge back to [facility] with home health PT/RN/aide tomorrow    Consultants:  Palliative care  Procedures:  TTE  Antimicrobials:  Ceftriaxone 5/25>>   Subjective: Patient seen examined bedside, resting comfortably.  Currently on nocturnal CPAP, taken off while in room.  Patient states her shortness of breath is much improved.  Currently tolerating room air at rest with appropriate SPO2.  No other specific complaints or  concerns at this time.  Denies headache, no dizziness, no visual disturbance, no nausea/vomiting/diarrhea, no chest pain, no palpitations, no shortness of breath more than his typical baseline, no abdominal pain, no fever/chills/night sweats, no nausea/vomiting/diarrhea, no focal weakness, no fatigue, no paresthesias.  No acute events overnight per nursing staff.  Objective: Vitals:   10/02/21 0100 10/02/21 0201 10/02/21 0319 10/02/21 0806  BP:   139/62 (!) 131/49  Pulse: 90 90 77 71  Resp: '18 18 16 14  '$ Temp:   (!) 97.3 F (36.3 C)   TempSrc:   Axillary   SpO2: 92% 92% 95% 91%    Intake/Output Summary (Last 24 hours) at 10/02/2021 1004 Last data filed at 10/02/2021 0320 Gross per 24 hour  Intake --  Output 650 ml  Net -650 ml   There were no vitals filed for this visit.  Examination:  Physical Exam: GEN: NAD, alert and oriented x 3, chronically ill in appearance HEENT: NCAT, PERRL, EOMI, sclera clear, MMM PULM: CTAB w/o wheezes/crackles, normal respiratory effort, on room air at rest CV: RRR w/o M/G/R GI: abd soft, NTND, NABS, no R/G/M MSK: no peripheral edema, muscle strength globally intact 5/5 bilateral upper/lower extremities NEURO: CN II-XII intact, no focal deficits, sensation to light touch intact PSYCH: normal mood/affect Integumentary: dry/intact, no rashes or wounds    Data Reviewed: I have personally reviewed following labs and imaging studies  CBC: Recent Labs  Lab 09/29/21 2059 10/01/21 0126  WBC 12.4* 11.6*  NEUTROABS 7.4  --   HGB 14.0 14.2  HCT 43.3  43.2  MCV 89.6 88.0  PLT 328 211   Basic Metabolic Panel: Recent Labs  Lab 09/29/21 2043 09/29/21 2254 09/30/21 0602 10/01/21 0126 10/02/21 0214  NA 135  --  139 137 134*  K 5.8* 4.1 3.8 4.0 4.3  CL 107  --  107 104 100  CO2 24  --  '22 22 23  '$ GLUCOSE 262*  --  129* 224* 236*  BUN 19  --  17 22 33*  CREATININE 0.93  --  0.94 1.01* 1.40*  CALCIUM 9.2  --  9.6 9.7 9.7   GFR: CrCl cannot be  calculated (Unknown ideal weight.). Liver Function Tests: No results for input(s): AST, ALT, ALKPHOS, BILITOT, PROT, ALBUMIN in the last 168 hours. No results for input(s): LIPASE, AMYLASE in the last 168 hours. No results for input(s): AMMONIA in the last 168 hours. Coagulation Profile: No results for input(s): INR, PROTIME in the last 168 hours. Cardiac Enzymes: No results for input(s): CKTOTAL, CKMB, CKMBINDEX, TROPONINI in the last 168 hours. BNP (last 3 results) No results for input(s): PROBNP in the last 8760 hours. HbA1C: Recent Labs    09/30/21 0602  HGBA1C 9.8*   CBG: Recent Labs  Lab 10/01/21 1114 10/01/21 1635 10/01/21 2106 10/02/21 0603 10/02/21 0840  GLUCAP 341* 285* 321* 232* 319*   Lipid Profile: No results for input(s): CHOL, HDL, LDLCALC, TRIG, CHOLHDL, LDLDIRECT in the last 72 hours. Thyroid Function Tests: No results for input(s): TSH, T4TOTAL, FREET4, T3FREE, THYROIDAB in the last 72 hours. Anemia Panel: No results for input(s): VITAMINB12, FOLATE, FERRITIN, TIBC, IRON, RETICCTPCT in the last 72 hours. Sepsis Labs: No results for input(s): PROCALCITON, LATICACIDVEN in the last 168 hours.  Recent Results (from the past 240 hour(s))  Resp Panel by RT-PCR (Flu A&B, Covid) Nasopharyngeal Swab     Status: None   Collection Time: 09/29/21  7:58 PM   Specimen: Nasopharyngeal Swab; Nasopharyngeal(NP) swabs in vial transport medium  Result Value Ref Range Status   SARS Coronavirus 2 by RT PCR NEGATIVE NEGATIVE Final    Comment: (NOTE) SARS-CoV-2 target nucleic acids are NOT DETECTED.  The SARS-CoV-2 RNA is generally detectable in upper respiratory specimens during the acute phase of infection. The lowest concentration of SARS-CoV-2 viral copies this assay can detect is 138 copies/mL. A negative result does not preclude SARS-Cov-2 infection and should not be used as the sole basis for treatment or other patient management decisions. A negative result may  occur with  improper specimen collection/handling, submission of specimen other than nasopharyngeal swab, presence of viral mutation(s) within the areas targeted by this assay, and inadequate number of viral copies(<138 copies/mL). A negative result must be combined with clinical observations, patient history, and epidemiological information. The expected result is Negative.  Fact Sheet for Patients:  EntrepreneurPulse.com.au  Fact Sheet for Healthcare Providers:  IncredibleEmployment.be  This test is no t yet approved or cleared by the [place] FDA and  has been authorized for detection and/or diagnosis of SARS-CoV-2 by FDA under an Emergency Use Authorization (EUA). This EUA will remain  in effect (meaning this test can be used) for the duration of the COVID-19 declaration under Section 564(b)(1) of the Act, 21 U.S.C.section 360bbb-3(b)(1), unless the authorization is terminated  or revoked sooner.       Influenza A by PCR NEGATIVE NEGATIVE Final   Influenza B by PCR NEGATIVE NEGATIVE Final    Comment: (NOTE) The Xpert Xpress SARS-CoV-2/FLU/RSV plus assay is intended as an aid in the  diagnosis of influenza from Nasopharyngeal swab specimens and should not be used as a sole basis for treatment. Nasal washings and aspirates are unacceptable for Xpert Xpress SARS-CoV-2/FLU/RSV testing.  Fact Sheet for Patients: EntrepreneurPulse.com.au  Fact Sheet for Healthcare Providers: IncredibleEmployment.be  This test is not yet approved or cleared by the [place] FDA and has been authorized for detection and/or diagnosis of SARS-CoV-2 by FDA under an Emergency Use Authorization (EUA). This EUA will remain in effect (meaning this test can be used) for the duration of the COVID-19 declaration under Section 564(b)(1) of the Act, 21 U.S.C. section 360bbb-3(b)(1), unless the authorization is terminated  or revoked.  Performed at [facility], [place] [address], [place], [place] [address]          Radiology Studies: No results found.      Scheduled Meds:  aspirin EC  81 mg Oral q morning   enoxaparin (LOVENOX) injection  40 mg Subcutaneous Q24H   ezetimibe  10 mg Oral q morning   fluticasone  1 spray Each Nare Daily   insulin aspart  0-15 Units Subcutaneous TID WC   insulin aspart  0-5 Units Subcutaneous QHS   insulin glargine-yfgn  20 Units Subcutaneous BID   isosorbide mononitrate  60 mg Oral q morning   metoprolol succinate  12.5 mg Oral Daily   rosuvastatin  40 mg Oral q morning   sertraline  100 mg Oral q morning   umeclidinium-vilanterol  1 puff Inhalation Daily   Continuous Infusions:  cefTRIAXone (ROCEPHIN)  IV 1 g (10/02/21 0935)     LOS: 2 days    Time spent: 48 minutes spent on chart review, discussion with nursing staff, consultants, updating family and interview/physical exam; more than 50% of that time was spent in counseling and/or coordination of care.    Sebrena Engh J [place] DO [facility] Available via Epic secure chat 7am-7pm After these hours, please refer to coverage provider listed on [email or web address] 10/02/2021, 10:04 AM

## 2021-10-02 NOTE — Progress Notes (Signed)
Mobility Specialist: Progress Note   10/02/21 1225  Mobility  Activity Ambulated with assistance in hallway  Level of Assistance Contact guard assist, steadying assist  Assistive Device Four wheel walker  Distance Ambulated (ft) 230 ft (115'x2)  Activity Response Tolerated well  $Mobility charge 1 Mobility   Pre-Mobility: 94% SpO2 During Mobility: 90% SpO2 Post-Mobility: 83 HR, 93% SpO2  Pt received in the bed and agreeable to mobility. Stopped x1 for seated break secondary to feeling SOB, sats >89% throughout. Pt sitting EOB after session with call bell and phone at her side.  [facility] Janautica Netzley Mobility Specialist Mobility Specialist 5 North: [phone] Mobility Specialist 6 North: [phone]

## 2021-10-02 NOTE — Progress Notes (Signed)
SATURATION QUALIFICATIONS: (This note is used to comply with regulatory documentation for home oxygen)  Patient Saturations on Room Air at Rest = 92%  Patient Saturations on Room Air while Ambulating = 89%  Patient Saturations on -- Liters of oxygen while Ambulating = N/A   [name], PT, DPT Acute Rehabilitation Services  Pager [phone] Office [phone]

## 2021-10-02 NOTE — Progress Notes (Signed)
Patient having some confusion this evening. Patient removed telemetry monitor because it was getting on her nerves. Informed patient that she needed to keep it on so we can monitor her heart.  Patient also was getting dressed thinking that she was leaving. Patient reoriented. Telemetry placed back on. Bed alarm on. Will continue to monitor

## 2021-10-02 NOTE — Progress Notes (Signed)
Physical Therapy Treatment Patient Details Name: [name] MRN: [id number] DOB: [birth year] Today's Date: 10/02/2021   History of Present Illness Pt is an 81 y.o. female admitted on 09/29/2021 with dyspnea on exertion, chest pain, acute hypoxia, abnormal breast finding. Workup for acute hypoxic respiratory failure due to acute diastolic CHF. PMH includes HTN, HLD, DM, asthma, OSA, obesity, PVD, breast CA (left 1992, right 2007).   PT Comments    Pt progressing with mobility. Today's session focused on incorporating energy conservation strategies with ADL tasks, ambulation for improving activity tolerance. Pt continues to endorse SOB with activity; noted DOE 3/4 with activity. Pt moving well with rollator and intermittent supervision for safety. If to remain admitted, will continue to follow acutely.  SpO2 89-92% on RA     Recommendations for follow up therapy are one component of a multi-disciplinary discharge planning process, led by the attending physician.  Recommendations may be updated based on patient status, additional functional criteria and insurance authorization.  Follow Up Recommendations  Home health PT     Assistance Recommended at Discharge Intermittent Supervision/Assistance  Patient can return home with the following Assistance with cooking/housework;Assist for transportation;Help with stairs or ramp for entrance;A little help with bathing/dressing/bathroom   Equipment Recommendations  None recommended by PT    Recommendations for Other Services       Precautions / Restrictions Precautions Precautions: Other (comment) Precaution Comments: watch SpO2 (does not wear baseline) Restrictions Weight Bearing Restrictions: No     Mobility  Bed Mobility Overal bed mobility: Modified Independent             General bed mobility comments: HOB slightly elevated, increased time    Transfers Overall transfer level: Modified independent Equipment used:  Rollator (4 wheels) Transfers: Sit to/from Stand Sit to Stand: Modified independent (Device/Increase time)           General transfer comment: initial cues to lock brakes prior to standing, good hand placement; good carryover of sequencing on subsequent trials from [facility] (over toilet) and recliner    Ambulation/Gait Ambulation/Gait assistance: Supervision Gait Distance (Feet): 40 Feet (+ 150) Assistive device: Rollator (4 wheels) Gait Pattern/deviations: Step-through pattern, Decreased stride length, Trunk flexed Gait velocity: Decreased     General Gait Details: slow, steady gait with rollator and supervision for safety; seated rest break after completing ADL tasks, walking to/from bathroom; progressing to hallway ambulation; DOE 3/4   Stairs             Wheelchair Mobility    Modified Rankin (Stroke Patients Only)       Balance Overall balance assessment: Needs assistance Sitting-balance support: Feet supported, No upper extremity supported Sitting balance-Leahy Scale: Good     Standing balance support: During functional activity, No upper extremity supported Standing balance-Leahy Scale: Fair Standing balance comment: can take steps without UE support, stand at sink for ADL tasks without UE support                            Cognition Arousal/Alertness: Awake/alert Behavior During Therapy: WFL for tasks assessed/performed Overall Cognitive Status: Within Functional Limits for tasks assessed                                 General Comments: WFL for simple tasks; not formally assessed        Exercises      General Comments General  comments (skin integrity, edema, etc.): SpO2 down to 89% on RA with activity; discussed energy conservation strategies for return home      Pertinent Vitals/Pain Pain Assessment Pain Assessment: No/denies pain Pain Intervention(s): Monitored during session    Home Living                           Prior Function            PT Goals (current goals can now be found in the care plan section) Progress towards PT goals: Progressing toward goals    Frequency    Min 3X/week      PT Plan Current plan remains appropriate    Co-evaluation              AM-PAC PT "6 Clicks" Mobility   Outcome Measure  Help needed turning from your back to your side while in a flat bed without using bedrails?: None Help needed moving from lying on your back to sitting on the side of a flat bed without using bedrails?: None Help needed moving to and from a bed to a chair (including a wheelchair)?: None Help needed standing up from a chair using your arms (e.g., wheelchair or bedside chair)?: None Help needed to walk in hospital room?: A Little Help needed climbing 3-5 steps with a railing? : A Little 6 Click Score: 22    End of Session Equipment Utilized During Treatment: Gait belt Activity Tolerance: Patient tolerated treatment well Patient left: in chair;with call bell/phone within reach;with chair alarm set Nurse Communication: Mobility status PT Visit Diagnosis: Other abnormalities of gait and mobility (R26.89);Difficulty in walking, not elsewhere classified (R26.2);Muscle weakness (generalized) (M62.81)     Time: 2505-3976 PT Time Calculation (min) (ACUTE ONLY): 25 min  Charges:  $Therapeutic Exercise: 8-22 mins $Therapeutic Activity: 8-22 mins                     [name], PT, DPT Acute Rehabilitation Services  Pager [phone] Office [phone]  [name] 10/02/2021, 10:07 AM

## 2021-10-03 DIAGNOSIS — R0609 Other forms of dyspnea: Secondary | ICD-10-CM | POA: Diagnosis not present

## 2021-10-03 LAB — CBC
HCT: 40.3 % (ref 36.0–46.0)
Hemoglobin: 13.5 g/dL (ref 12.0–15.0)
MCH: 29.4 pg (ref 26.0–34.0)
MCHC: 33.5 g/dL (ref 30.0–36.0)
MCV: 87.8 fL (ref 80.0–100.0)
Platelets: 296 10*3/uL (ref 150–400)
RBC: 4.59 MIL/uL (ref 3.87–5.11)
RDW: 13.2 % (ref 11.5–15.5)
WBC: 11.8 10*3/uL — ABNORMAL HIGH (ref 4.0–10.5)
nRBC: 0 % (ref 0.0–0.2)

## 2021-10-03 LAB — BASIC METABOLIC PANEL
Anion gap: 12 (ref 5–15)
BUN: 41 mg/dL — ABNORMAL HIGH (ref 8–23)
CO2: 21 mmol/L — ABNORMAL LOW (ref 22–32)
Calcium: 9.7 mg/dL (ref 8.9–10.3)
Chloride: 103 mmol/L (ref 98–111)
Creatinine, Ser: 1.21 mg/dL — ABNORMAL HIGH (ref 0.44–1.00)
GFR, Estimated: 45 mL/min — ABNORMAL LOW (ref >60)
Glucose, Bld: 207 mg/dL — ABNORMAL HIGH (ref 70–99)
Potassium: 4.1 mmol/L (ref 3.5–5.1)
Sodium: 136 mmol/L (ref 135–145)

## 2021-10-03 LAB — GLUCOSE, CAPILLARY
Glucose-Capillary: 246 mg/dL — ABNORMAL HIGH (ref 70–99)
Glucose-Capillary: 264 mg/dL — ABNORMAL HIGH (ref 70–99)

## 2021-10-03 MED ORDER — METOPROLOL SUCCINATE ER 25 MG PO TB24: 12.5000 mg | ORAL_TABLET | Freq: Every day | ORAL | 2 refills | Status: DC

## 2021-10-03 MED ORDER — UMECLIDINIUM-VILANTEROL 62.5-25 MCG/ACT IN AEPB: 1.0000 | INHALATION_SPRAY | Freq: Every day | RESPIRATORY_TRACT | 2 refills | Status: AC

## 2021-10-03 MED ORDER — DOXYCYCLINE HYCLATE 100 MG PO TBEC: 100.0000 mg | DELAYED_RELEASE_TABLET | Freq: Two times a day (BID) | ORAL | 0 refills | Status: AC

## 2021-10-03 NOTE — TOC Progression Note (Signed)
Transition of Care [facility]) - Progression Note    Patient Details  Name: [name] MRN: [id number] Date of Birth: 1940/11/18  Transition of Care [facility]) CM/SW [place], LCSW Phone Number: 10/03/2021, 1:00 PM  Clinical Narrative:     CSW called facility again. Pt is from [facility] and no one answered and unable to leave VM.  TOC team will continue to assist with discharge planning needs.   Expected Discharge Plan: Assisted Living Barriers to Discharge: Continued Medical Work up  Expected Discharge Plan and Services Expected Discharge Plan: Assisted Living     Post Acute Care Choice: Resumption of Svcs/PTA Provider Living arrangements for the past 2 months: [place] Expected Discharge Date: 10/03/21                                     Social Determinants of Health (SDOH) Interventions    Readmission Risk Interventions     View : No data to display.

## 2021-10-03 NOTE — TOC Progression Note (Signed)
Transition of Care [facility]) - Progression Note    Patient Details  Name: Journey [place] MRN: [id number] Date of Birth: 07/03/1940  Transition of Care [facility]) CM/SW [place], LCSW Phone Number: 10/03/2021, 9:41 AM  Clinical Narrative:     CSW attempted to call facility twice today and is unable to reach anyone. CSW spoke with pt's daughter [name] and she confirmed that the number that CSW was calling [phone] was the correct number. [name] explained that she is unsure if they have anyone at the front desk today. CSW let her know about pt's pending DC. CSW could not leave a message.  TOC team will continue to assist with discharge planning needs.   Expected Discharge Plan: Assisted Living Barriers to Discharge: Continued Medical Work up  Expected Discharge Plan and Services Expected Discharge Plan: Assisted Living     Post Acute Care Choice: Resumption of Svcs/PTA Provider Living arrangements for the past 2 months: [place] Expected Discharge Date: 10/03/21                                     Social Determinants of Health (SDOH) Interventions    Readmission Risk Interventions     View : No data to display.

## 2021-10-03 NOTE — Plan of Care (Signed)
°  Problem: Clinical Measurements: °Goal: Will remain free from infection °Outcome: Progressing °Goal: Respiratory complications will improve °Outcome: Progressing °  °

## 2021-10-03 NOTE — TOC Transition Note (Addendum)
Transition of Care [facility]) - CM/SW Discharge Note   Patient Details  Name: [name] MRN: [id number] Date of Birth: November 14, 1940  Transition of Care [facility]) CM/SW Contact:  Bary Castilla, LCSW Phone Number: 10/03/2021, 3:23 PM   Clinical Narrative:     CSW was finally able to reach facility after 4th attempt and spoke with Herbie Baltimore. Herbie Baltimore informed CSW that a DC summary should be sent with patient. CSW asked about fl2 and was informed one was not needed. CSW explained that PT was being recommended. Robert informed to send the order with patient as well. Pt' son will transport pt back to facility and DC summary and [place] orders will be sent with pt as well.  Call to report to [phone] ask for Robert.  TOC signing off.    Barriers to Discharge: Continued Medical Work up   Patient Goals and CMS Choice     Choice offered to / list presented to : Adult Children, Patient  Discharge Placement                       Discharge Plan and Services     Post Acute Care Choice: Resumption of Svcs/PTA Provider                               Social Determinants of Health (SDOH) Interventions     Readmission Risk Interventions     View : No data to display.

## 2021-10-03 NOTE — Discharge Summary (Signed)
Physician Discharge Summary  [name] OQH:[id number] DOB: 22-Feb-1941 DOA: 09/29/2021  PCP: Pcp, No  Admit date: 09/29/2021 Discharge date: 10/03/2021  Admitted From: [facility] Disposition: [facility]  Recommendations for Outpatient Follow-up:  Follow up with PCP in 1-2 weeks Outpatient follow-up with pulmonology 3-4 weeks Please obtain BMP in one week to reassess renal function Started on Anoro Ellipta inhaler for better control of her underlying COPD Recommend monitoring daily weights outpatient with consideration of as needed Lasix if there is significant weight gain of 3 pounds in 1 day or 5 pounds in 1 week Recommend continue goals of care discussions given patient's advanced age, comorbidities and underlying breast cancer  Home Health: PT/RN/aide; outpatient palliative care to follow Equipment/Devices: None  Discharge Condition: Stable CODE STATUS: Full code Diet recommendation: Heart healthy/consistent carb regular diet  History of present illness:  [name] is a 81 y.o. female with past medical history significant for is an 81 year old female with past medical history significant for HTN, HLD, DM2, OSA on CPAP, COPD, history of breast cancer, chronic diastolic congestive heart failure who presented to [facility] ED on 5/24 from [facility] with progressive shortness of breath.  Reports worse with exertion.  States previously was on 24-hour home oxygen but currently only requiring CPAP at night.  Did report episode of chest pain day prior to admission that she was carrying some boxes into her house lasting roughly 1 minute with associated dyspnea but no diaphoresis or nausea.   In the ED, patient was tachycardic and hypoxic with desaturation to the mid 80s on room air.  She was placed on 3 L nasal cannula.  WBC 12.4, hemoglobin 14.0, platelet count 328.  Sodium 135, potassium 5.8, chloride 107, bicarb 24, BUN 19, creatinine 0.9.  Glucose 292.  COVID-19 PCR  negative.  Influenza A/B PCR negative.  High sensitive troponin negative x2.  BNP 212.  CT angiogram chest with mild lingular scarring and/or atelectasis, marked severity right-sided mastitis with partially calcified soft tissue lesion seen within the lower outer quadrant of the right breast, large ventral hernia, aortic atherosclerosis.  Patient was given IV Lasix 40 mg x 1 by EDP.  Hospitalist service consulted for further evaluation management of acute respiratory failure likely secondary to CHF exacerbation.  Hospital course:  Acute hypoxic respiratory failure, POA Etiology likely multifactorial, patient with underlying COPD and has been off her home inhalers for some time as she did not get any refills, also with mild volume overload in the setting of diastolic congestive heart failure exacerbation with elevated BNP and vascular congestion noted on imaging.  Patient previously was oxygen dependent but has been off for some time.  Patient was started on scheduled neb treatments and received 2 doses of IV Lasix with improvement of her respiratory status and was able to be titrated off of supplemental oxygen.  No significant desaturation below 89% while ambulating.  Most likely etiology related to her underlying COPD as she has been out of her controlling inhaler for an extended period of time.   Acute on chronic diastolic congestive heart failure Patient presenting with progressive shortness of breath, noted to be hypoxic on ED arrival requiring 3 L nasal cannula.  BNP elevated to 212 with vascular congestion noted on imaging studies.  TTE with LVEF 65-70%, no LV regional wall motion normalities, grade 1 diastolic dysfunction, trivial MR, IVC normal.  Patient was initially started on IV Lasix with bump in creatinine and was subsequently discontinued.  Although likely  a contributing factor, leading etiology to her hypoxia likely related to her underlying COPD.  Recommend monitoring of daily weights  outpatient.   Acute renal failure Etiology likely secondary to overdiuresis.  Creatinine was 0.9 on admission, trended up to 1.40 with IV diuresis which was discontinued.  Creatinine 1.21 at time of discharge.  Repeat BMP 1 week.   COPD Patient reports previous history of extensive tobacco abuse, quit for many years. Follows with pulmonology outpatient; although has not been seen for some time. Previously was oxygen dependent now only utilizes CPAP at nighttime.  Also states that she was previously on an inhaler [name]); but has been off for some time as she never had any refills. Started Anora Ellipta 62.5-25 mcg 1 puff daily.  Oxygen was titrated off and no significant desaturation below 89% with ambulation during hospitalization.  Recommend outpatient follow-up with pulmonology to reestablish care.   HTN Metoprolol succinate 12.5 mg p.o. daily, Isosorbide mononitrate 60 mg p.o. every morning, irbesartan 75 mg p.o. daily.  Continue aspirin and statin   HLD --Crestor 40 mg p.o. daily, Zetia   DM2 with hyperglycemia Hemoglobin A1c 9.8, poorly controlled.  Home regimen includes Tresiba 50 units [place] BID (25u BID if BG <100) and NovoLog insulin sliding scale.  Follow-up with PCP for further guidance and titration.   History of breast cancer Mastitis Patient with history of breast cancer several years ago and no longer on treatment.  CTA chest shows findings suggestive of marked severity right-sided mastitis with a partially calcified soft tissue lesion within the lower outer quadrant of the right breast, presence of underlying neoplastic process cannot be excluded.  CT findings have been discussed with family and patient; son reports that she had several follow-up appointments regarding this mass that she canceled.  Has been seen by Dr. Jana Hakim in the past.  Recommend outpatient mammography as needed for further evaluation and reengaging medical oncology if desires aggressive treatment.  During  hospitalization patient was seen by palliative care; remains full code but does agree to outpatient palliative care to follow on discharge.  Patient was started on ceftriaxone for concern of mastitis, will continue doxycycline to complete 7-day course on discharge.   OSA Continue nocturnal CPAP   Abdominal hernia CT notable for a large ventral hernia along the midline upper abdomen containing fat and a segment of normal-appearing transverse colon.  Nontender on exam and patient aware of history of abdominal hernia.  Recommend outpatient follow-up with general surgery as needed.  Discharge Diagnoses:  Principal Problem:   Dyspnea on exertion Active Problems:   Hyperlipidemia   Chest pain   Diabetes mellitus, type 2 (HCC)   Benign essential HTN   Peripheral blood vessel disorder (HCC)   Abnormal finding on breast imaging   Abdominal hernia   SOB (shortness of breath)   Acute diastolic CHF (congestive heart failure) [facility])    Discharge Instructions  Discharge Instructions     Call MD for:  difficulty breathing, headache or visual disturbances   Complete by: As directed    Call MD for:  extreme fatigue   Complete by: As directed    Call MD for:  persistant dizziness or light-headedness   Complete by: As directed    Call MD for:  persistant nausea and vomiting   Complete by: As directed    Call MD for:  severe uncontrolled pain   Complete by: As directed    Call MD for:  temperature >100.4   Complete by: As  directed    Diet - low sodium heart healthy   Complete by: As directed    Increase activity slowly   Complete by: As directed       Allergies as of 10/03/2021       Reactions   Niacin Hives, Shortness Of Breath   Penicillins Other (See Comments)   Did it involve swelling of the face/tongue/throat, SOB, or low BP? Y Did it involve sudden or severe rash/hives, skin peeling, or any reaction on the inside of your mouth or nose? UNK Did you need to seek medical attention  at a hospital or doctor's office? N When did it last happen? Can't remember   If all above answers are "NO", may proceed with cephalosporin use.   Saccharin Palpitations   Allergen: IWP:YKDXIP Dye+methocarbamol+saccharin (Saccharin)   Clindamycin/lincomycin Other (See Comments)   Unknown reaction - listed on MAR   Codeine Other (See Comments)   Night-mare   Diclofenac Sodium Hives   Erythromycin Diarrhea   GI upset   Glipizide Other (See Comments)   GI upset   Iodinated Contrast Media Other (See Comments)   Unknown reaction - listed on MAR   Latex Other (See Comments)   Unknown reaction - listed on MAR   Macrolides And Ketolides Other (See Comments)   Unknown reaction - listed on MAR   Metformin And Related Diarrhea   Methocarbamol Other (See Comments)   Tarchycardia   Ramipril Other (See Comments)   Unknown reaction   Sulfa Antibiotics Diarrhea   Trimox [amoxicillin] Other (See Comments)   GI upset   Tetanus Toxoid Itching, Swelling, Rash        Medication List     TAKE these medications    acetaminophen 500 MG tablet Commonly known as: TYLENOL Take 500 mg by mouth every 12 (twelve) hours as needed (general pain).   albuterol 108 (90 Base) MCG/ACT inhaler Commonly known as: ProAir HFA Inhale 1-2 puffs into the lungs every 6 (six) hours as needed for wheezing or shortness of breath. What changed:  how much to take when to take this   aspirin EC 81 MG tablet Take 81 mg by mouth every morning. Swallow whole.   azelastine 0.1 % nasal spray Commonly known as: ASTELIN Place 2 sprays into both nostrils 2 (two) times daily as needed for rhinitis.   doxycycline 100 MG EC tablet Commonly known as: DORYX Take 1 tablet (100 mg total) by mouth 2 (two) times daily for 5 days. Start taking on: Oct 04, 2021   ezetimibe 10 MG tablet Commonly known as: ZETIA Take 10 mg by mouth every morning.   fluticasone 50 MCG/ACT nasal spray Commonly known as: FLONASE Place 1  spray into both nostrils daily.   insulin aspart 100 UNIT/ML injection Commonly known as: novoLOG Inject 3-14 Units into the skin See admin instructions. Inject 3-14 units subcutaneously per sliding scale: CBG 70-100 3 units, 101-150 5 units, 151-200 8 units, 201-250 10 units, 251-300 12 units, >300 14 units   irbesartan 75 MG tablet Commonly known as: AVAPRO Take 1 tablet (75 mg total) by mouth daily.   isosorbide mononitrate 60 MG 24 hr tablet Commonly known as: IMDUR TAKE 1 TABLET BY MOUTH ONCE DAILY --PLEASE  KEEP  UPCOMIMG  APPT  FOR  FURTHER  REFILLS What changed: See the new instructions.   loratadine 10 MG tablet Commonly known as: CLARITIN Take 10 mg by mouth every morning.   metoprolol succinate 25 MG 24 hr tablet Commonly  known as: TOPROL-XL Take 0.5 tablets (12.5 mg total) by mouth daily.   rosuvastatin 40 MG tablet Commonly known as: CRESTOR Take 40 mg by mouth every morning.   sertraline 100 MG tablet Commonly known as: ZOLOFT Take 100 mg by mouth every morning.   Tyler Aas FlexTouch 100 UNIT/ML FlexTouch Pen Generic drug: insulin degludec Inject 25-50 Units into the skin See admin instructions. Inject 50 units subcutaneously twice daily, inject 25 units every 12 hours as needed for BS <100   umeclidinium-vilanterol 62.5-25 MCG/ACT Aepb Commonly known as: ANORO ELLIPTA Inhale 1 puff into the lungs daily.   Vitamin D 50 MCG (2000 UT) tablet Take 2,000 Units by mouth every morning.        Follow-up Information     Belva Crome, MD. Schedule an appointment as soon as possible for a visit in 1 week(s).   Specialty: Cardiology Contact information: [address] [place] [address] [phone]                Allergies  Allergen Reactions   Niacin Hives and Shortness Of Breath   Penicillins Other (See Comments)    Did it involve swelling of the face/tongue/throat, SOB, or low BP? Y Did it involve sudden or severe  rash/hives, skin peeling, or any reaction on the inside of your mouth or nose? UNK Did you need to seek medical attention at a hospital or doctor's office? N When did it last happen? Can't remember   If all above answers are "NO", may proceed with cephalosporin use.    Saccharin Palpitations    Allergen: LOV:FIEPPI Dye+methocarbamol+saccharin (Saccharin)   Clindamycin/Lincomycin Other (See Comments)    Unknown reaction - listed on MAR   Codeine Other (See Comments)    Night-mare   Diclofenac Sodium Hives   Erythromycin Diarrhea    GI upset   Glipizide Other (See Comments)    GI upset   Iodinated Contrast Media Other (See Comments)    Unknown reaction - listed on MAR   Latex Other (See Comments)    Unknown reaction - listed on MAR   Macrolides And Ketolides Other (See Comments)    Unknown reaction - listed on MAR   Metformin And Related Diarrhea   Methocarbamol Other (See Comments)    Tarchycardia   Ramipril Other (See Comments)    Unknown reaction   Sulfa Antibiotics Diarrhea   Trimox [Amoxicillin] Other (See Comments)    GI upset   Tetanus Toxoid Itching, Swelling and Rash    Consultations: Palliative care   Procedures/Studies: CT Angio Chest PE W/Cm &/Or Wo Cm  Result Date: 09/29/2021 CLINICAL DATA:  Shortness of breath with exertion. EXAM: CT ANGIOGRAPHY CHEST WITH CONTRAST TECHNIQUE: Multidetector CT imaging of the chest was performed using the standard protocol during bolus administration of intravenous contrast. Multiplanar CT image reconstructions and MIPs were obtained to evaluate the vascular anatomy. RADIATION DOSE REDUCTION: This exam was performed according to the departmental dose-optimization program which includes automated exposure control, adjustment of the mA and/or kV according to patient size and/or use of iterative reconstruction technique. CONTRAST:  58m OMNIPAQUE IOHEXOL 300 MG/ML  SOLN COMPARISON:  August 04, 2005 FINDINGS: Cardiovascular: There is  marked severity calcification of the thoracic aorta, without evidence of aortic aneurysm or dissection. Satisfactory opacification of the pulmonary arteries to the segmental level. No evidence of pulmonary embolism. Normal heart size with marked severity coronary artery calcification. No pericardial effusion. Mediastinum/Nodes: No enlarged mediastinal, hilar, or axillary lymph  nodes. Thyroid gland, trachea, and esophagus demonstrate no significant findings. Lungs/Pleura: Mild scarring and/or atelectasis is seen within the lingular region. There is no evidence of acute infiltrate, pleural effusion or pneumothorax. Upper Abdomen: No acute abnormality. Musculoskeletal: A 9.8 cm x 4.1 cm ventral hernia is seen along the midline the upper abdomen. This contains fat and a segment of normal appearing transverse colon. Marked severity cutaneous thickening is seen throughout the right breast. Associated subcutaneous inflammatory fat stranding is noted. A 4.0 cm x 1.7 cm ill-defined lobulated, partially calcified soft tissue lesion is seen within the lower outer quadrant of the right breast. Multilevel degenerative changes seen throughout the thoracic spine. Review of the MIP images confirms the above findings. IMPRESSION: 1. Mild lingular scarring and/or atelectasis 2. Findings suggestive of marked severity right-sided mastitis with a partially calcified soft tissue lesion seen within the lower outer quadrant of the right breast. The presence of an underlying neoplastic process cannot be excluded. Correlation with mammography is recommended. 3. Large ventral hernia along the midline the upper abdomen containing fat and a segment of normal appearing transverse colon. Aortic Atherosclerosis (ICD10-I70.0). Electronically Signed   By: Virgina Norfolk M.D.   On: 09/29/2021 23:47   DG Chest Port 1 View  Result Date: 09/29/2021 CLINICAL DATA:  Shortness of breath EXAM: PORTABLE CHEST 1 VIEW COMPARISON:  05/07/2019 FINDINGS:  Transverse diameter of heart is increased. Central pulmonary vessels are more prominent. There are no signs of alveolar pulmonary edema. Linear densities seen in both lower lung fields. There is no significant pleural effusion. Patient's chin is partly obscuring the apices. As far as seen, there is no definite demonstrable pneumothorax. Surgical clips are seen in the left chest wall. IMPRESSION: Cardiomegaly. Central pulmonary vessels are more prominent which may be due to semi upright position or suggest mild CHF. Increased interstitial markings are seen in both lower lung fields suggesting scarring or interstitial edema or interstitial pneumonia. There is no focal pulmonary consolidation. Electronically Signed   By: Elmer Picker M.D.   On: 09/29/2021 20:19   ECHOCARDIOGRAM COMPLETE  Result Date: 09/30/2021    ECHOCARDIOGRAM REPORT   Patient Name:   [name] Date of Exam: 09/30/2021 Medical Rec #:  [id number]          Height:       66.0 in Accession #:    [id number]         Weight:       201.0 lb Date of Birth:  October 16, 1940           BSA:          2.004 m Patient Age:    58 years           BP:           145/48 mmHg Patient Gender: F                  HR:           67 bpm. Exam Location:  Inpatient Procedure: 2D Echo, Cardiac Doppler, Color Doppler and Intracardiac            Opacification Agent Indications:    CHF  History:        Patient has prior history of Echocardiogram examinations, most                 recent 07/20/2018. Risk Factors:Diabetes and Hypertension.  Sonographer:    Jefferey Pica Referring Phys: [id number] [place]  Sonographer Comments: Image  acquisition challenging due to patient body habitus. IMPRESSIONS  1. Left ventricular ejection fraction, by estimation, is 65 to 70%. The left ventricle has normal function. The left ventricle has no regional wall motion abnormalities. There is mild left ventricular hypertrophy. Left ventricular diastolic parameters are consistent with  Grade I diastolic dysfunction (impaired relaxation).  2. Right ventricular systolic function is normal. The right ventricular size is normal. Tricuspid regurgitation signal is inadequate for assessing PA pressure.  3. The mitral valve is abnormal. Trivial mitral valve regurgitation.  4. The aortic valve was not well visualized. Aortic valve regurgitation is not visualized.  5. The inferior vena cava is normal in size with <50% respiratory variability, suggesting right atrial pressure of 8 mmHg. Comparison(s): Changes from prior study are noted. 07/20/2018: LVEF 60-65%, grade 2 DD. FINDINGS  Left Ventricle: Left ventricular ejection fraction, by estimation, is 65 to 70%. The left ventricle has normal function. The left ventricle has no regional wall motion abnormalities. Definity contrast agent was given IV to delineate the left ventricular  endocardial borders. The left ventricular internal cavity size was normal in size. There is mild left ventricular hypertrophy. Left ventricular diastolic parameters are consistent with Grade I diastolic dysfunction (impaired relaxation). Indeterminate filling pressures. Right Ventricle: The right ventricular size is normal. No increase in right ventricular wall thickness. Right ventricular systolic function is normal. Tricuspid regurgitation signal is inadequate for assessing PA pressure. Left Atrium: Left atrial size was normal in size. Right Atrium: Right atrial size was normal in size. Pericardium: There is no evidence of pericardial effusion. Mitral Valve: The mitral valve is abnormal. Mild to moderate mitral annular calcification. Trivial mitral valve regurgitation. Tricuspid Valve: The tricuspid valve is grossly normal. Tricuspid valve regurgitation is trivial. Aortic Valve: The aortic valve was not well visualized. Aortic valve regurgitation is not visualized. Aortic valve peak gradient measures 15.7 mmHg. Pulmonic Valve: The pulmonic valve was grossly normal. Pulmonic  valve regurgitation is trivial. Aorta: The aortic root and ascending aorta are structurally normal, with no evidence of dilitation. Venous: The inferior vena cava is normal in size with less than 50% respiratory variability, suggesting right atrial pressure of 8 mmHg. IAS/Shunts: No atrial level shunt detected by color flow Doppler.  LEFT VENTRICLE PLAX 2D LVIDd:         3.90 cm   Diastology LVIDs:         2.60 cm   LV e' medial:    5.43 cm/s LV PW:         1.10 cm   LV E/e' medial:  16.8 LV IVS:        1.10 cm   LV e' lateral:   12.00 cm/s LVOT diam:     1.90 cm   LV E/e' lateral: 7.6 LV SV:         77 LV SV Index:   38 LVOT Area:     2.84 cm  IVC IVC diam: 2.00 cm LEFT ATRIUM             Index        RIGHT ATRIUM           Index LA diam:        4.90 cm 2.45 cm/m   RA Area:     17.90 cm LA Vol (A2C):   69.4 ml 34.63 ml/m  RA Volume:   50.80 ml  25.35 ml/m LA Vol (A4C):   46.0 ml 22.95 ml/m LA Biplane Vol: 58.5 ml 29.19 ml/m  AORTIC VALVE                 PULMONIC VALVE AV Area (Vmax): 1.62 cm     PV Vmax:       0.76 m/s AV Vmax:        198.00 cm/s  PV Peak grad:  2.3 mmHg AV Peak Grad:   15.7 mmHg LVOT Vmax:      113.00 cm/s LVOT Vmean:     76.300 cm/s LVOT VTI:       0.270 m  AORTA Ao Asc diam: 3.10 cm MITRAL VALVE MV Area (PHT): 2.15 cm     SHUNTS MV Decel Time: 353 msec     Systemic VTI:  0.27 m MV E velocity: 91.00 cm/s   Systemic Diam: 1.90 cm MV A velocity: 135.00 cm/s MV E/A ratio:  0.67 Lyman Bishop MD Electronically signed by Lyman Bishop MD Signature Date/Time: 09/30/2021/11:49:00 AM    Final      Subjective: Patient seen examined bedside, resting comfortably.  Sitting at edge of bed.  No complaints this morning.  Ready for discharge home to her ALF.  No specific questions or concerns.  Denies headache, no dizziness, no chest pain, no palpitations, no shortness of breath greater than her typical baseline, no nausea/vomiting/diarrhea, no fever/chills/night sweats, no abdominal pain, no focal  weakness, no fatigue, no paresthesias.  No acute events overnight per nursing staff.  Discharge Exam: Vitals:   10/03/21 0344 10/03/21 0825  BP: 130/69 118/65  Pulse: 73 67  Resp: 17 19  Temp: 98 F (36.7 C) 98 F (36.7 C)  SpO2: 91% 91%   Vitals:   10/02/21 2310 10/02/21 2349 10/03/21 0344 10/03/21 0825  BP:  (!) 139/59 130/69 118/65  Pulse:  63 73 67  Resp: (!) 22 (!) '24 17 19  '$ Temp:  (!) 97.4 F (36.3 C) 98 F (36.7 C) 98 F (36.7 C)  TempSrc:  Oral Oral Oral  SpO2:  94% 91% 91%    Physical Exam: GEN: NAD, alert and oriented x 3, chronically ill in appearance HEENT: NCAT, PERRL, EOMI, sclera clear, MMM PULM: CTAB w/o wheezes/crackles, normal respiratory effort, on room air at rest CV: RRR w/o M/G/R GI: abd soft, NTND, NABS, no R/G/M MSK: no peripheral edema, muscle strength globally intact 5/5 bilateral upper/lower extremities NEURO: CN II-XII intact, no focal deficits, sensation to light touch intact PSYCH: normal mood/affect Integumentary: dry/intact, no rashes or wounds    The results of significant diagnostics from this hospitalization (including imaging, microbiology, ancillary and laboratory) are listed below for reference.     Microbiology: Recent Results (from the past 240 hour(s))  Resp Panel by RT-PCR (Flu A&B, Covid) Nasopharyngeal Swab     Status: None   Collection Time: 09/29/21  7:58 PM   Specimen: Nasopharyngeal Swab; Nasopharyngeal(NP) swabs in vial transport medium  Result Value Ref Range Status   SARS Coronavirus 2 by RT PCR NEGATIVE NEGATIVE Final    Comment: (NOTE) SARS-CoV-2 target nucleic acids are NOT DETECTED.  The SARS-CoV-2 RNA is generally detectable in upper respiratory specimens during the acute phase of infection. The lowest concentration of SARS-CoV-2 viral copies this assay can detect is 138 copies/mL. A negative result does not preclude SARS-Cov-2 infection and should not be used as the sole basis for treatment or other  patient management decisions. A negative result may occur with  improper specimen collection/handling, submission of specimen other than nasopharyngeal swab, presence of viral mutation(s) within the areas targeted by this  assay, and inadequate number of viral copies(<138 copies/mL). A negative result must be combined with clinical observations, patient history, and epidemiological information. The expected result is Negative.  Fact Sheet for Patients:  EntrepreneurPulse.com.au  Fact Sheet for Healthcare Providers:  IncredibleEmployment.be  This test is no t yet approved or cleared by the [place] FDA and  has been authorized for detection and/or diagnosis of SARS-CoV-2 by FDA under an Emergency Use Authorization (EUA). This EUA will remain  in effect (meaning this test can be used) for the duration of the COVID-19 declaration under Section 564(b)(1) of the Act, 21 U.S.C.section 360bbb-3(b)(1), unless the authorization is terminated  or revoked sooner.       Influenza A by PCR NEGATIVE NEGATIVE Final   Influenza B by PCR NEGATIVE NEGATIVE Final    Comment: (NOTE) The Xpert Xpress SARS-CoV-2/FLU/RSV plus assay is intended as an aid in the diagnosis of influenza from Nasopharyngeal swab specimens and should not be used as a sole basis for treatment. Nasal washings and aspirates are unacceptable for Xpert Xpress SARS-CoV-2/FLU/RSV testing.  Fact Sheet for Patients: EntrepreneurPulse.com.au  Fact Sheet for Healthcare Providers: IncredibleEmployment.be  This test is not yet approved or cleared by the [place] FDA and has been authorized for detection and/or diagnosis of SARS-CoV-2 by FDA under an Emergency Use Authorization (EUA). This EUA will remain in effect (meaning this test can be used) for the duration of the COVID-19 declaration under Section 564(b)(1) of the Act, 21 U.S.C. section  360bbb-3(b)(1), unless the authorization is terminated or revoked.  Performed at [facility], [place] [address], [place], [place] [address]      Labs: BNP (last 3 results) Recent Labs    09/29/21 2059  BNP 093.8*   Basic Metabolic Panel: Recent Labs  Lab 09/29/21 2043 09/29/21 2254 09/30/21 0602 10/01/21 0126 10/02/21 0214 10/03/21 0147  NA 135  --  139 137 134* 136  K 5.8* 4.1 3.8 4.0 4.3 4.1  CL 107  --  107 104 100 103  CO2 24  --  '22 22 23 '$ 21*  GLUCOSE 262*  --  129* 224* 236* 207*  BUN 19  --  17 22 33* 41*  CREATININE 0.93  --  0.94 1.01* 1.40* 1.21*  CALCIUM 9.2  --  9.6 9.7 9.7 9.7   Liver Function Tests: No results for input(s): AST, ALT, ALKPHOS, BILITOT, PROT, ALBUMIN in the last 168 hours. No results for input(s): LIPASE, AMYLASE in the last 168 hours. No results for input(s): AMMONIA in the last 168 hours. CBC: Recent Labs  Lab 09/29/21 2059 10/01/21 0126 10/03/21 0147  WBC 12.4* 11.6* 11.8*  NEUTROABS 7.4  --   --   HGB 14.0 14.2 13.5  HCT 43.3 43.2 40.3  MCV 89.6 88.0 87.8  PLT 328 323 296   Cardiac Enzymes: No results for input(s): CKTOTAL, CKMB, CKMBINDEX, TROPONINI in the last 168 hours. BNP: Invalid input(s): POCBNP CBG: Recent Labs  Lab 10/02/21 0840 10/02/21 1041 10/02/21 1552 10/02/21 2111 10/03/21 0609  GLUCAP 319* 309* 251* 319* 246*   D-Dimer No results for input(s): DDIMER in the last 72 hours. Hgb A1c No results for input(s): HGBA1C in the last 72 hours. Lipid Profile No results for input(s): CHOL, HDL, LDLCALC, TRIG, CHOLHDL, LDLDIRECT in the last 72 hours. Thyroid function studies No results for input(s): TSH, T4TOTAL, T3FREE, THYROIDAB in the last 72 hours.  Invalid input(s): FREET3 Anemia work up No results for input(s): VITAMINB12, FOLATE, FERRITIN, TIBC,  IRON, RETICCTPCT in the last 72 hours. Urinalysis    Component Value Date/Time   COLORURINE YELLOW 10/27/2020 0243   APPEARANCEUR HAZY (A) 10/27/2020  0243   LABSPEC >1.046 (H) 10/27/2020 0243   PHURINE 5.0 10/27/2020 0243   GLUCOSEU 50 (A) 10/27/2020 0243   HGBUR NEGATIVE 10/27/2020 0243   BILIRUBINUR NEGATIVE 10/27/2020 0243   KETONESUR NEGATIVE 10/27/2020 0243   PROTEINUR 30 (A) 10/27/2020 0243   NITRITE NEGATIVE 10/27/2020 0243   LEUKOCYTESUR TRACE (A) 10/27/2020 0243   Sepsis Labs Invalid input(s): PROCALCITONIN,  WBC,  LACTICIDVEN Microbiology Recent Results (from the past 240 hour(s))  Resp Panel by RT-PCR (Flu A&B, Covid) Nasopharyngeal Swab     Status: None   Collection Time: 09/29/21  7:58 PM   Specimen: Nasopharyngeal Swab; Nasopharyngeal(NP) swabs in vial transport medium  Result Value Ref Range Status   SARS Coronavirus 2 by RT PCR NEGATIVE NEGATIVE Final    Comment: (NOTE) SARS-CoV-2 target nucleic acids are NOT DETECTED.  The SARS-CoV-2 RNA is generally detectable in upper respiratory specimens during the acute phase of infection. The lowest concentration of SARS-CoV-2 viral copies this assay can detect is 138 copies/mL. A negative result does not preclude SARS-Cov-2 infection and should not be used as the sole basis for treatment or other patient management decisions. A negative result may occur with  improper specimen collection/handling, submission of specimen other than nasopharyngeal swab, presence of viral mutation(s) within the areas targeted by this assay, and inadequate number of viral copies(<138 copies/mL). A negative result must be combined with clinical observations, patient history, and epidemiological information. The expected result is Negative.  Fact Sheet for Patients:  EntrepreneurPulse.com.au  Fact Sheet for Healthcare Providers:  IncredibleEmployment.be  This test is no t yet approved or cleared by the [place] FDA and  has been authorized for detection and/or diagnosis of SARS-CoV-2 by FDA under an Emergency Use Authorization (EUA). This EUA  will remain  in effect (meaning this test can be used) for the duration of the COVID-19 declaration under Section 564(b)(1) of the Act, 21 U.S.C.section 360bbb-3(b)(1), unless the authorization is terminated  or revoked sooner.       Influenza A by PCR NEGATIVE NEGATIVE Final   Influenza B by PCR NEGATIVE NEGATIVE Final    Comment: (NOTE) The Xpert Xpress SARS-CoV-2/FLU/RSV plus assay is intended as an aid in the diagnosis of influenza from Nasopharyngeal swab specimens and should not be used as a sole basis for treatment. Nasal washings and aspirates are unacceptable for Xpert Xpress SARS-CoV-2/FLU/RSV testing.  Fact Sheet for Patients: EntrepreneurPulse.com.au  Fact Sheet for Healthcare Providers: IncredibleEmployment.be  This test is not yet approved or cleared by the [place] FDA and has been authorized for detection and/or diagnosis of SARS-CoV-2 by FDA under an Emergency Use Authorization (EUA). This EUA will remain in effect (meaning this test can be used) for the duration of the COVID-19 declaration under Section 564(b)(1) of the Act, 21 U.S.C. section 360bbb-3(b)(1), unless the authorization is terminated or revoked.  Performed at [facility], [place] [address] [place],  [address]      Time coordinating discharge: Over 30 minutes  SIGNED:   Shikha Bibb J [place] DO  [facility] 10/03/2021, 9:28 AM

## 2021-10-05 DIAGNOSIS — J449 Chronic obstructive pulmonary disease, unspecified: Secondary | ICD-10-CM | POA: Diagnosis not present

## 2021-10-06 ENCOUNTER — Non-Acute Institutional Stay: Payer: Medicare Other | Admitting: Internal Medicine

## 2021-10-06 DIAGNOSIS — E118 Type 2 diabetes mellitus with unspecified complications: Secondary | ICD-10-CM

## 2021-10-06 DIAGNOSIS — F4321 Adjustment disorder with depressed mood: Secondary | ICD-10-CM

## 2021-10-06 DIAGNOSIS — Z515 Encounter for palliative care: Secondary | ICD-10-CM

## 2021-10-06 DIAGNOSIS — J449 Chronic obstructive pulmonary disease, unspecified: Secondary | ICD-10-CM

## 2021-10-06 DIAGNOSIS — R0609 Other forms of dyspnea: Secondary | ICD-10-CM

## 2021-10-06 DIAGNOSIS — G4733 Obstructive sleep apnea (adult) (pediatric): Secondary | ICD-10-CM

## 2021-10-06 NOTE — Progress Notes (Signed)
[facility] Consult Note Telephone: [phone]  Fax: [phone]   Date of encounter: 10/21/21 5:33 PM PATIENT NAME: [name] [address] [place] [address]   [phone] (home) [phone] (work) DOB: 1941-05-03 MRN: [id number] PRIMARY CARE PROVIDER:    [name], NP,  [place] [place] [address] [phone]  REFERRING PROVIDER:   Clide Deutscher, NP  RESPONSIBLE PARTY:    Contact Information     Name Relation Home Work Mobile   [place] Daughter   [phone]   Sharlette, Jansma   [phone]        I met face to face with patient and family in [facility]. Palliative Care was asked to follow this patient by consultation request of  Clide Deutscher, NP to address advance care planning and complex medical decision making. This is the initial visit.                                     ASSESSMENT AND PLAN / RECOMMENDATIONS:   Advance Care Planning/Goals of Care: Goals include to maximize quality of life and symptom management. Patient/health care surrogate gave his/her permission to discuss.Our advance care planning conversation included a discussion about:    The value and importance of advance care planning  Experiences with loved ones who have been seriously ill or have died  Exploration of personal, cultural or spiritual beliefs that might influence medical decisions  Exploration of goals of care in the event of a sudden injury or illness  Identification  of a healthcare agent--daughter, Saralyn Pilar and son, Jessa Stinson Review and updating or creation of an  advance directive document . Decision not to resuscitate or to de-escalate disease focused treatments due to poor prognosis. CODE STATUS:  MOST form completed today with patient: full code, full scope but only short-term ventilation--if not improving, would want care withdrawn, determine  abx use or limitation when infection occurs, IVF trial and no feeding tubes  Symptom Management/Plan: 1. Dyspnea on exertion -reports she has had this for years due to #2, she is pretty sedentary per nursing--stays in her bed or chair most of the day, she also says she's never been social -continue prn proair hfa, but might consider adding advair or anoro for her to see if it helped her though this does seem multifactorial  2. COPD GOLD 0 vs asthma -cont proair hfa prn -monitor and consider additional inhaler or neb to decrease dyspnea  3. OSA (obstructive sleep apnea) -cont nasal cpap at hs and with naps  4. Type II diabetes mellitus with complication (HCC) -is not very adherent with diet per staff -continue current tresiba insulin bid plus has SSI with novolog; has h/o lows so likes to keep her own not so healthy snacks available for this which she orders independently  Adjustment disorder with depressed mood -misses her husband who is in a snf -did ask nursing if they can work on getting her arrangements to visit him 1-2 times per week as he cannot get out (uses wheelchair)  5. Palliative care by specialist -had initial assessment today -completed MOST with patient and she plans to discuss more with her daughter but they've talked about her wishes before, she shares -would review form annually to ensure her wishes remain the same in case of decline in QOL  Follow up Palliative Care Visit: Palliative  care will continue to follow for complex medical decision making, advance care planning, and clarification of goals. Return 12 weeks or prn.   This visit was coded based on medical decision making (MDM). 20 mins spent on ACP  PPS: 40%  HOSPICE ELIGIBILITY/DIAGNOSIS: TBD  Chief Complaint: initial palliative care consult  HISTORY OF PRESENT ILLNESS:  [name] is a 80 y.o. year old female  with COPD, OSA on nasal cpap, DMII, depression/adjustment disorder (husband in SNF at  a different location so she rarely sees him).    Pt was agreeable to completing MOST today--she was planning to discuss how she completed it with her daughter and son that live nearby and would be her HCPOAs if she could not decide for herself.  She was significantly dyspneic when getting up out of bed, taking off nasal cpap and walking across the room, moving a few items and sitting down.  She reports she's been this way for several years due to her COPD.    She admits to keeping lots of snacks b/c of low sugars overnight if she does not have a snack before bed.  She apparently orders lots that are not the best choices from [employer].    History obtained from review of EMR, discussion with primary team, and interview with family, facility staff/caregiver and/or Ms. Biebel.  I reviewed available labs, medications, imaging, studies and related documents from the EMR.  Records reviewed and summarized above.   ROS  Review of Systems  Constitutional:  Positive for activity change and fatigue. Negative for appetite change and fever.       Spends a lot of time in bed  HENT:  Negative for congestion and trouble swallowing.   Eyes:  Negative for visual disturbance.  Respiratory:  Positive for apnea and shortness of breath. Negative for cough, chest tightness and wheezing.   Cardiovascular:  Negative for chest pain, palpitations and leg swelling.  Gastrointestinal:  Negative for abdominal pain and constipation.  Genitourinary:  Negative for dysuria.  Musculoskeletal:  Positive for arthralgias. Negative for joint swelling.  Skin:  Negative for color change.  Neurological:  Negative for dizziness and weakness.  Psychiatric/Behavioral:  Negative for confusion and sleep disturbance.     Physical Exam: Current and past weights: Physical Exam Vitals reviewed.  Constitutional:      Appearance: Normal appearance. She is obese. She is not ill-appearing.  HENT:     Head: Normocephalic and atraumatic.   Cardiovascular:     Rate and Rhythm: Normal rate and regular rhythm.     Pulses: Normal pulses.     Heart sounds: Normal heart sounds.  Pulmonary:     Effort: Pulmonary effort is normal.     Breath sounds: No wheezing or rales.     Comments: Dyspneic with minimal exertion (walking a few feet across the room) Abdominal:     General: Bowel sounds are normal.     Palpations: Abdomen is soft.  Musculoskeletal:        General: Normal range of motion.     Cervical back: Neck supple.     Right lower leg: No edema.     Left lower leg: No edema.  Skin:    General: Skin is warm and dry.  Neurological:     General: No focal deficit present.     Mental Status: She is alert.  Psychiatric:        Mood and Affect: Mood normal.      CURRENT PROBLEM LIST:    Patient Active Problem List   Diagnosis Date Noted   Acute diastolic CHF (congestive heart failure) ([place]) 10/01/2021   Dyspnea on exertion 09/30/2021   Abnormal finding on breast imaging 09/30/2021   Abdominal hernia 09/30/2021   SOB (shortness of breath) 09/30/2021   Breast cancer ([place])    COPD (chronic obstructive pulmonary disease) ([place]) 07/08/2016   Acute bronchitis 04/26/2016   Asthma with acute exacerbation 04/26/2016   Obesity (BMI 30-39.9) 12/20/2014   Chest pain 11/11/2014   Breast cancer, female ([place]) 11/11/2014   Benign neoplasm of colon 11/11/2014   CAFL (chronic airflow limitation) ([place]) 11/11/2014   Clinical depression 11/11/2014   Major depressive episode 11/11/2014   Diabetes mellitus with peripheral circulatory disorder ([place]) 11/11/2014   Diabetes mellitus, type 2 ([place]) 11/11/2014   Diabetes mellitus type 2, uncontrolled 11/11/2014   Benign essential HTN 11/11/2014   Combined fat and carbohydrate induced hyperlipemia 11/11/2014   Adiposity 11/11/2014   Bone/cartilage disorder 11/11/2014   Blood clot associated with vein wall inflammation 11/11/2014   Hypercholesterolemia without hypertriglyceridemia  11/11/2014   Peripheral blood vessel disorder ([place]) 11/11/2014   Left foot pain 11/11/2014   Dysesthesia 11/11/2014   Carotid bruit 11/11/2014   COPD GOLD 0 vs asthma 08/26/2014   Venous insufficiency of right lower extremity 08/26/2014   OSA (obstructive sleep apnea) 01/20/2014   Type II diabetes mellitus with complication ([place]) 30/11/6224   Abnormal echocardiogram 12/11/2013   Dermatitis, eczematoid 12/11/2013   Coronary artery disease involving native coronary artery of native heart with angina pectoris ([place]) 06/10/2013   Nocturnal hypoxemia 10/24/2012   Hyperlipidemia 05/22/2008   Dyspnea 05/22/2008   NEOPLASM, MALIGNANT, BREAST, HX OF 05/22/2008   PAST MEDICAL HISTORY:  Active Ambulatory Problems    Diagnosis Date Noted   Hyperlipidemia 05/22/2008   Dyspnea 05/22/2008   NEOPLASM, MALIGNANT, BREAST, HX OF 05/22/2008   Nocturnal hypoxemia 10/24/2012   Coronary artery disease involving native coronary artery of native heart with angina pectoris ([place]) 06/10/2013   Type II diabetes mellitus with complication ([place]) 33/35/4562   OSA (obstructive sleep apnea) 01/20/2014   COPD GOLD 0 vs asthma 08/26/2014   Venous insufficiency of right lower extremity 08/26/2014   Abnormal echocardiogram 12/11/2013   Chest pain 11/11/2014   Breast cancer, female ([place]) 11/11/2014   Benign neoplasm of colon 11/11/2014   CAFL (chronic airflow limitation) ([place]) 11/11/2014   Clinical depression 11/11/2014   Major depressive episode 11/11/2014   Dermatitis, eczematoid 12/11/2013   Diabetes mellitus with peripheral circulatory disorder ([place]) 11/11/2014   Diabetes mellitus, type 2 ([place]) 11/11/2014   Diabetes mellitus type 2, uncontrolled 11/11/2014   Benign essential HTN 11/11/2014   Combined fat and carbohydrate induced hyperlipemia 11/11/2014   Adiposity 11/11/2014   Bone/cartilage disorder 11/11/2014   Blood clot associated with vein wall inflammation 11/11/2014   Hypercholesterolemia without  hypertriglyceridemia 11/11/2014   Peripheral blood vessel disorder ([place]) 11/11/2014   Left foot pain 11/11/2014   Dysesthesia 11/11/2014   Carotid bruit 11/11/2014   Obesity (BMI 30-39.9) 12/20/2014   Acute bronchitis 04/26/2016   Asthma with acute exacerbation 04/26/2016   COPD (chronic obstructive pulmonary disease) ([place]) 07/08/2016   Breast cancer ([place])    Dyspnea on exertion 09/30/2021   Abnormal finding on breast imaging 09/30/2021   Abdominal hernia 09/30/2021   SOB (shortness of breath) 56/38/9373   Acute diastolic CHF (congestive heart failure) ([place]) 10/01/2021   Resolved Ambulatory Problems    Diagnosis Date Noted   Essential hypertension 05/22/2008  Arteriosclerosis of coronary artery 11/11/2014   Past Medical History:  Diagnosis Date   Asthma    Coronary atherosclerosis    Depression    DM (diabetes mellitus) (HCC)    Hypertension    Osteopenia    Personal history of radiation therapy    PVD (peripheral vascular disease) (HCC)    SOCIAL HX:  Social History   Tobacco Use   Smoking status: Former    Packs/day: 1.00    Years: 40.00    Total pack years: 40.00    Types: Cigarettes    Quit date: 05/10/2003    Years since quitting: 18.4   Smokeless tobacco: Never  Substance Use Topics   Alcohol use: No   FAMILY HX:  Family History  Problem Relation Age of Onset   Heart disease Mother    Heart disease Father       ALLERGIES:  Allergies  Allergen Reactions   Niacin Hives and Shortness Of Breath   Penicillins Other (See Comments)    Did it involve swelling of the face/tongue/throat, SOB, or low BP? Y Did it involve sudden or severe rash/hives, skin peeling, or any reaction on the inside of your mouth or nose? UNK Did you need to seek medical attention at a hospital or doctor's office? N When did it last happen? Can't remember   If all above answers are "NO", may proceed with cephalosporin use.    Saccharin Palpitations    Allergen: Kdc:yellow  Dye+methocarbamol+saccharin (Saccharin)   Clindamycin/Lincomycin Other (See Comments)    Unknown reaction - listed on MAR   Codeine Other (See Comments)    Night-mare   Diclofenac Sodium Hives   Erythromycin Diarrhea    GI upset   Glipizide Other (See Comments)    GI upset   Iodinated Contrast Media Other (See Comments)    Unknown reaction - listed on MAR   Latex Other (See Comments)    Unknown reaction - listed on MAR   Macrolides And Ketolides Other (See Comments)    Unknown reaction - listed on MAR   Metformin And Related Diarrhea   Methocarbamol Other (See Comments)    Tarchycardia   Ramipril Other (See Comments)    Unknown reaction   Sulfa Antibiotics Diarrhea   Trimox [Amoxicillin] Other (See Comments)    GI upset   Tetanus Toxoid Itching, Swelling and Rash     PERTINENT MEDICATIONS:  Outpatient Encounter Medications as of 10/06/2021  Medication Sig   acetaminophen (TYLENOL) 500 MG tablet Take 500 mg by mouth every 12 (twelve) hours as needed (general pain).   albuterol (PROAIR HFA) 108 (90 Base) MCG/ACT inhaler Inhale 1-2 puffs into the lungs every 6 (six) hours as needed for wheezing or shortness of breath. (Patient taking differently: Inhale 2 puffs into the lungs every 4 (four) hours as needed for wheezing or shortness of breath.)   aspirin EC 81 MG tablet Take 81 mg by mouth every morning. Swallow whole.   azelastine (ASTELIN) 0.1 % nasal spray Place 2 sprays into both nostrils 2 (two) times daily as needed for rhinitis.   Cholecalciferol (VITAMIN D) 50 MCG (2000 UT) tablet Take 2,000 Units by mouth every morning.   [EXPIRED] doxycycline (DORYX) 100 MG EC tablet Take 1 tablet (100 mg total) by mouth 2 (two) times daily for 5 days.   ezetimibe (ZETIA) 10 MG tablet Take 10 mg by mouth every morning.   fluticasone (FLONASE) 50 MCG/ACT nasal spray Place 1 spray into both nostrils daily.     insulin aspart (NOVOLOG) 100 UNIT/ML injection Inject 3-14 Units into the skin See  admin instructions. Inject 3-14 units subcutaneously per sliding scale: CBG 70-100 3 units, 101-150 5 units, 151-200 8 units, 201-250 10 units, 251-300 12 units, >300 14 units   insulin degludec (TRESIBA FLEXTOUCH) 100 UNIT/ML FlexTouch Pen Inject 25-50 Units into the skin See admin instructions. Inject 50 units subcutaneously twice daily, inject 25 units every 12 hours as needed for BS <100   irbesartan (AVAPRO) 75 MG tablet Take 1 tablet (75 mg total) by mouth daily.   isosorbide mononitrate (IMDUR) 60 MG 24 hr tablet TAKE 1 TABLET BY MOUTH ONCE DAILY --PLEASE  KEEP  UPCOMIMG  APPT  FOR  FURTHER  REFILLS (Patient taking differently: Take 60 mg by mouth every morning.)   loratadine (CLARITIN) 10 MG tablet Take 10 mg by mouth every morning.   metoprolol succinate (TOPROL-XL) 25 MG 24 hr tablet Take 0.5 tablets (12.5 mg total) by mouth daily.   rosuvastatin (CRESTOR) 40 MG tablet Take 40 mg by mouth every morning.   sertraline (ZOLOFT) 100 MG tablet Take 100 mg by mouth every morning.   umeclidinium-vilanterol (ANORO ELLIPTA) 62.5-25 MCG/ACT AEPB Inhale 1 puff into the lungs daily.   No facility-administered encounter medications on file as of 10/06/2021.   Thank you for the opportunity to participate in the care of Ms. Hevener.  The palliative care team will continue to follow. Please call our office at [phone] if we can be of additional assistance.   Bubba Vanbenschoten, DO ,   COVID-19 PATIENT SCREENING TOOL Asked and negative response unless otherwise noted:  Have you had symptoms of covid, tested positive or been in contact with someone with symptoms/positive test in the past 5-10 days?

## 2021-10-07 DIAGNOSIS — G4733 Obstructive sleep apnea (adult) (pediatric): Secondary | ICD-10-CM | POA: Diagnosis not present

## 2021-10-07 DIAGNOSIS — J449 Chronic obstructive pulmonary disease, unspecified: Secondary | ICD-10-CM | POA: Diagnosis not present

## 2021-10-07 DIAGNOSIS — Z794 Long term (current) use of insulin: Secondary | ICD-10-CM | POA: Diagnosis not present

## 2021-10-07 DIAGNOSIS — Z8709 Personal history of other diseases of the respiratory system: Secondary | ICD-10-CM | POA: Diagnosis not present

## 2021-10-07 DIAGNOSIS — E782 Mixed hyperlipidemia: Secondary | ICD-10-CM | POA: Diagnosis not present

## 2021-10-07 DIAGNOSIS — I5033 Acute on chronic diastolic (congestive) heart failure: Secondary | ICD-10-CM | POA: Diagnosis not present

## 2021-10-07 DIAGNOSIS — E1151 Type 2 diabetes mellitus with diabetic peripheral angiopathy without gangrene: Secondary | ICD-10-CM | POA: Diagnosis not present

## 2021-10-07 DIAGNOSIS — Z79899 Other long term (current) drug therapy: Secondary | ICD-10-CM | POA: Diagnosis not present

## 2021-10-07 DIAGNOSIS — N179 Acute kidney failure, unspecified: Secondary | ICD-10-CM | POA: Diagnosis not present

## 2021-10-07 DIAGNOSIS — I10 Essential (primary) hypertension: Secondary | ICD-10-CM | POA: Diagnosis not present

## 2021-10-07 DIAGNOSIS — I25119 Atherosclerotic heart disease of native coronary artery with unspecified angina pectoris: Secondary | ICD-10-CM | POA: Diagnosis not present

## 2021-10-19 DIAGNOSIS — F411 Generalized anxiety disorder: Secondary | ICD-10-CM | POA: Diagnosis not present

## 2021-10-19 DIAGNOSIS — F5105 Insomnia due to other mental disorder: Secondary | ICD-10-CM | POA: Diagnosis not present

## 2021-10-19 DIAGNOSIS — F331 Major depressive disorder, recurrent, moderate: Secondary | ICD-10-CM | POA: Diagnosis not present

## 2021-10-20 DIAGNOSIS — N1831 Chronic kidney disease, stage 3a: Secondary | ICD-10-CM | POA: Diagnosis not present

## 2021-10-20 DIAGNOSIS — J449 Chronic obstructive pulmonary disease, unspecified: Secondary | ICD-10-CM | POA: Diagnosis not present

## 2021-10-20 DIAGNOSIS — E1122 Type 2 diabetes mellitus with diabetic chronic kidney disease: Secondary | ICD-10-CM | POA: Diagnosis not present

## 2021-10-20 DIAGNOSIS — I129 Hypertensive chronic kidney disease with stage 1 through stage 4 chronic kidney disease, or unspecified chronic kidney disease: Secondary | ICD-10-CM | POA: Diagnosis not present

## 2021-10-20 DIAGNOSIS — G4733 Obstructive sleep apnea (adult) (pediatric): Secondary | ICD-10-CM | POA: Diagnosis not present

## 2021-10-21 ENCOUNTER — Encounter: Payer: Self-pay | Admitting: Internal Medicine

## 2021-10-29 ENCOUNTER — Ambulatory Visit: Payer: HMO | Admitting: Pulmonary Disease

## 2021-11-02 ENCOUNTER — Encounter: Payer: Self-pay | Admitting: Interventional Cardiology

## 2021-11-02 ENCOUNTER — Ambulatory Visit (INDEPENDENT_AMBULATORY_CARE_PROVIDER_SITE_OTHER): Payer: HMO | Admitting: Interventional Cardiology

## 2021-11-02 VITALS — BP 118/58 | HR 71 | Ht 66.0 in | Wt 236.4 lb

## 2021-11-02 DIAGNOSIS — I5031 Acute diastolic (congestive) heart failure: Secondary | ICD-10-CM

## 2021-11-02 DIAGNOSIS — I10 Essential (primary) hypertension: Secondary | ICD-10-CM

## 2021-11-02 DIAGNOSIS — Z79899 Other long term (current) drug therapy: Secondary | ICD-10-CM

## 2021-11-02 DIAGNOSIS — I25119 Atherosclerotic heart disease of native coronary artery with unspecified angina pectoris: Secondary | ICD-10-CM

## 2021-11-02 DIAGNOSIS — E1151 Type 2 diabetes mellitus with diabetic peripheral angiopathy without gangrene: Secondary | ICD-10-CM | POA: Diagnosis not present

## 2021-11-02 DIAGNOSIS — E782 Mixed hyperlipidemia: Secondary | ICD-10-CM

## 2021-11-02 MED ORDER — DAPAGLIFLOZIN PROPANEDIOL 10 MG PO TABS: 10.0000 mg | ORAL_TABLET | Freq: Every day | ORAL | 11 refills | Status: AC

## 2021-11-10 DIAGNOSIS — E7800 Pure hypercholesterolemia, unspecified: Secondary | ICD-10-CM | POA: Diagnosis not present

## 2021-11-10 DIAGNOSIS — E1165 Type 2 diabetes mellitus with hyperglycemia: Secondary | ICD-10-CM | POA: Diagnosis not present

## 2021-11-10 DIAGNOSIS — I2510 Atherosclerotic heart disease of native coronary artery without angina pectoris: Secondary | ICD-10-CM | POA: Diagnosis not present

## 2021-11-10 DIAGNOSIS — I10 Essential (primary) hypertension: Secondary | ICD-10-CM | POA: Diagnosis not present

## 2021-11-16 DIAGNOSIS — F411 Generalized anxiety disorder: Secondary | ICD-10-CM | POA: Diagnosis not present

## 2021-11-16 DIAGNOSIS — F331 Major depressive disorder, recurrent, moderate: Secondary | ICD-10-CM | POA: Diagnosis not present

## 2021-11-16 DIAGNOSIS — F5105 Insomnia due to other mental disorder: Secondary | ICD-10-CM | POA: Diagnosis not present

## 2021-11-17 ENCOUNTER — Other Ambulatory Visit: Payer: HMO

## 2021-11-23 ENCOUNTER — Ambulatory Visit: Payer: HMO | Admitting: Pulmonary Disease

## 2021-11-23 DIAGNOSIS — I129 Hypertensive chronic kidney disease with stage 1 through stage 4 chronic kidney disease, or unspecified chronic kidney disease: Secondary | ICD-10-CM | POA: Diagnosis not present

## 2021-11-23 DIAGNOSIS — N1831 Chronic kidney disease, stage 3a: Secondary | ICD-10-CM | POA: Diagnosis not present

## 2021-11-23 DIAGNOSIS — E1122 Type 2 diabetes mellitus with diabetic chronic kidney disease: Secondary | ICD-10-CM | POA: Diagnosis not present

## 2021-11-24 ENCOUNTER — Other Ambulatory Visit: Payer: PPO

## 2021-11-29 ENCOUNTER — Other Ambulatory Visit: Payer: PPO

## 2021-12-15 ENCOUNTER — Ambulatory Visit: Payer: HMO | Admitting: Interventional Cardiology

## 2021-12-15 DIAGNOSIS — I10 Essential (primary) hypertension: Secondary | ICD-10-CM

## 2021-12-15 DIAGNOSIS — E782 Mixed hyperlipidemia: Secondary | ICD-10-CM

## 2021-12-15 DIAGNOSIS — I5031 Acute diastolic (congestive) heart failure: Secondary | ICD-10-CM

## 2021-12-15 DIAGNOSIS — E1151 Type 2 diabetes mellitus with diabetic peripheral angiopathy without gangrene: Secondary | ICD-10-CM

## 2021-12-15 DIAGNOSIS — I25119 Atherosclerotic heart disease of native coronary artery with unspecified angina pectoris: Secondary | ICD-10-CM

## 2022-01-03 ENCOUNTER — Ambulatory Visit: Payer: HMO | Admitting: Pulmonary Disease

## 2022-01-11 DIAGNOSIS — I10 Essential (primary) hypertension: Secondary | ICD-10-CM | POA: Diagnosis not present

## 2022-02-21 ENCOUNTER — Ambulatory Visit: Payer: HMO | Admitting: Interventional Cardiology

## 2022-02-21 DIAGNOSIS — E1151 Type 2 diabetes mellitus with diabetic peripheral angiopathy without gangrene: Secondary | ICD-10-CM

## 2022-02-21 DIAGNOSIS — I10 Essential (primary) hypertension: Secondary | ICD-10-CM

## 2022-02-21 DIAGNOSIS — I5032 Chronic diastolic (congestive) heart failure: Secondary | ICD-10-CM

## 2022-02-21 DIAGNOSIS — E782 Mixed hyperlipidemia: Secondary | ICD-10-CM

## 2022-02-21 DIAGNOSIS — I25119 Atherosclerotic heart disease of native coronary artery with unspecified angina pectoris: Secondary | ICD-10-CM

## 2022-04-08 NOTE — Progress Notes (Deleted)
Cardiology Office Note:    Date:  04/08/2022   ID:  [name], DOB 11-Feb-1941, MRN [id number]  PCP:  Katherine Sessions, NP   [name] Cardiologist:  Katherine Grooms, MD {  Referring MD: [name]*    History of Present Illness:    [name] is a 81 y.o. female with a hx of CAD with high grade distal RCA disease collateralized by left, chronic diastolic HF, HTN, OSA, chronic venous stasis, COPD and PAD who was previously followed by Dr. Tamala Sullivan who now returns to clinic for follow-up.  Patient hospitalized 09/29/21-10/03/21 for acute on chronic diastolic HF, mastitis and COPD exacerbation.TTE with LVEF 65-70%, G1DD, normal RV, no valve disease, RAP 67mHg. She was diuresed with IV lasix and discharged off diuretics.   Last saw Dr. STamala Julianon 11/02/21 where she was improved from a volume standpoint.   Today, ***  Past Medical History:  Diagnosis Date   Asthma    [place]ast cancer ([facility]    LT 1992, RT 2007   Coronary atherosclerosis    Depression    [place]diabetes mellitus) [place] [place]    Hyperlipidemia    Hypertension    OSA (obstructive sleep apnea) 01/20/2014   Osteopenia    Personal history of radiation therapy    PVD (peripheral vascular disease) ([facility]     Past Surgical History:  Procedure Laterality Date   APPENDECTOMY     BREAST LUMPECTOMY     1992 on left, 2007 on right   CHOLECYSTECTOMY     CYST REMOVAL NECK      Current Medications: No outpatient medications have been marked as taking for the 04/11/22 encounter (Appointment) with PFreada Bergeron MD.     Allergies:   Niacin, Penicillins, Saccharin, Clindamycin/lincomycin, Codeine, Diclofenac sodium, Erythromycin, Glipizide, Iodinated contrast media, Latex, Macrolides and ketolides, Metformin and related, Methocarbamol, Ramipril, Sulfa antibiotics, Trimox [amoxicillin], and Tetanus toxoid   Social History   Socioeconomic History   Marital status: Married     Spouse name: Not on file   Number of children: Not on file   Years of education: Not on file   Highest education level: Not on file  Occupational History   Occupation: retired  Tobacco Use   Smoking status: Former    Packs/day: 1.00    Years: 40.00    Total pack years: 40.00    Types: Cigarettes    Quit date: 05/10/2003    Years since quitting: 18.9   Smokeless tobacco: Never  Vaping Use   Vaping Use: Never used  Substance and Sexual Activity   Alcohol use: No   Drug use: No   Sexual activity: Not on file  Other Topics Concern   Not on file  Social History Narrative   Not on file   Social Determinants of Health   Financial Resource Strain: Not on file  Food Insecurity: No Food Insecurity (07/25/2018)   Hunger Vital Sign    Worried About Running Out of Food in the Last Year: Never true    Ran Out of Food in the Last Year: Never true  Transportation Needs: No Transportation Needs (07/25/2018)   PRAPARE - THydrologist(Medical): No    Lack of Transportation (Non-Medical): No  Physical Activity: Not on file  Stress: Not on file  Social Connections: Not on file     Family History: The patient's ***family history includes Heart disease in her father and mother.  ROS:   Please see the history of present illness.    *** All other systems reviewed and are negative.  EKGs/Labs/Other Studies Reviewed:    The following studies were reviewed today: TTE 2021-10-02: IMPRESSIONS     1. Left ventricular ejection fraction, by estimation, is 65 to 70%. The  left ventricle has normal function. The left ventricle has no regional  wall motion abnormalities. There is mild left ventricular hypertrophy.  Left ventricular diastolic parameters  are consistent with Grade I diastolic dysfunction (impaired relaxation).   2. Right ventricular systolic function is normal. The right ventricular  size is normal. Tricuspid regurgitation signal is inadequate for  assessing  PA pressure.   3. The mitral valve is abnormal. Trivial mitral valve regurgitation.   4. The aortic valve was not well visualized. Aortic valve regurgitation  is not visualized.   5. The inferior vena cava is normal in size with <50% respiratory  variability, suggesting right atrial pressure of 8 mmHg.   Carotid Ultrasound 12/2019: Summary:  Right Carotid: Velocities in the right ICA are consistent with a 1-39%  stenosis.   Left Carotid: Velocities in the left ICA are consistent with a 40-59%  stenosis.  EKG:  EKG is *** ordered today.  The ekg ordered today demonstrates ***  Recent Labs: 09/29/2021: B Natriuretic Peptide 212.6 10/03/2021: BUN 41; Creatinine, Ser 1.21; Hemoglobin 13.5; Platelets 296; Potassium 4.1; Sodium 136  Recent Lipid Panel No results found for: "CHOL", "TRIG", "HDL", "CHOLHDL", "VLDL", "LDLCALC", "LDLDIRECT"   Risk Assessment/Calculations:   {Does this patient have ATRIAL FIBRILLATION?:[id number]}  No BP recorded.  {Refresh Note OR Click here to enter BP  :1}***         Physical Exam:    VS:  There were no vitals taken for this visit.    Wt Readings from Last 3 Encounters:  11/02/21 236 lb 6.4 oz (107.2 kg)  10/26/20 201 lb (91.2 kg)  08/14/20 215 lb (97.5 kg)     GEN: *** Well nourished, well developed in no acute distress HEENT: Normal NECK: No JVD; No carotid bruits LYMPHATICS: No lymphadenopathy CARDIAC: ***RRR, no murmurs, rubs, gallops RESPIRATORY:  Clear to auscultation without rales, wheezing or rhonchi  ABDOMEN: Soft, non-tender, non-distended MUSCULOSKELETAL:  No edema; No deformity  SKIN: Warm and dry NEUROLOGIC:  Alert and oriented x 3 PSYCHIATRIC:  Normal affect   ASSESSMENT:    No diagnosis found. PLAN:    In order of problems listed above:  #Chronic Diastolic HF: Last TTE 16/1096 with LVEF 65-70%, G1DD, no significant valve disease. Currently euvolemic with NYHA class II symptoms. Not on diuretics  currently. -Continue farxiga '10mg'$  daily -? Start spiro  #CAD with known RCA disease: Known distal RCA disease with L>R collaterals. On medical management with no anginal symptoms.  -Continue ASA '81mg'$  daily -Continue zetia '10mg'$  adaily -Continue crestor '40mg'$  daily -Continue metop 12.'5mg'$  XL daily  #Carotid Artery Disease: U/s 2021 with RICA 0-45%, LICA 40-98%. -Continue zetia '10mg'$  adaily -Continue crestor '40mg'$  daily -Continue ASA '81mg'$  daily -Repeat carotids for monitoring  #HTN: Controlled. -Continue irbesartan '75mg'$  daily -Continue metop 12.'5mg'$  XL daily  #HLD: -Continue zetia '10mg'$  adaily -Continue crestor '40mg'$  daily -Goal LDL<70  #DMII: -Management per PCP      {Are you ordering a CV Procedure (e.g. stress test, cath, DCCV, TEE, etc)?   Press F2        :[id number]}    Medication Adjustments/Labs and Tests Ordered: Current medicines are reviewed at length with the patient today.  Concerns regarding medicines are outlined above.  No orders of the defined types were placed in this encounter.  No orders of the defined types were placed in this encounter.   There are no Patient Instructions on file for this visit.   Signed, Katherine Bergeron, MD  04/08/2022 9:59 AM    [place]

## 2022-04-11 ENCOUNTER — Ambulatory Visit: Payer: Medicare Other | Attending: Cardiology | Admitting: Cardiology

## 2022-04-11 NOTE — Progress Notes (Incomplete)
Cardiology Office Note:    Date:  04/11/2022   ID:  [name], DOB [birth year], MRN [id number]  PCP:  Katherine Sessions, NP   [facility] Cardiologist:  Katherine Grooms, MD {  Referring MD: [name]*    History of Present Illness:    [name] is a 81 y.o. female with a hx of CAD with high grade distal RCA disease collateralized by left, chronic diastolic HF, HTN, OSA, chronic venous stasis, COPD and PAD who was previously followed by [name] who now returns to clinic for follow-up.  Patient hospitalized 09/29/21-10/03/21 for acute on chronic diastolic HF, mastitis and COPD exacerbation.TTE with LVEF 65-70%, G1DD, normal RV, no valve disease, RAP 78mHg. She was diuresed with IV lasix and discharged off diuretics.   Last saw Dr. STamala Julianon 11/02/21 where she was improved from a volume standpoint.   Today, the patient states that ***  She denies any palpitations, chest pain, shortness of breath, or peripheral edema. No lightheadedness, headaches, syncope, orthopnea, or PND.   Past Medical History:  Diagnosis Date   Asthma    [place] ([facility]    LT 1992, RT 2007   Coronary atherosclerosis    Depression    [place]abetes mellitus) ([facility]    Hyperlipidemia    Hypertension    OSA (obstructive sleep apnea) 01/20/2014   Osteopenia    Personal history of radiation therapy    PVD (peripheral vascular disease) ([facility]     Past Surgical History:  Procedure Laterality Date   APPENDECTOMY     BREAST LUMPECTOMY     1992 on left, 2007 on right   CHOLECYSTECTOMY     CYST REMOVAL NECK      Current Medications: No outpatient medications have been marked as taking for the 04/11/22 encounter (Appointment) with PFreada Bergeron MD.     Allergies:   Niacin, Penicillins, Saccharin, Clindamycin/lincomycin, Codeine, Diclofenac sodium, Erythromycin, Glipizide, Iodinated contrast media, Latex, Macrolides and ketolides, Metformin  and related, Methocarbamol, Ramipril, Sulfa antibiotics, Trimox [amoxicillin], and Tetanus toxoid   Social History   Socioeconomic History   Marital status: Married    Spouse name: Not on file   Number of children: Not on file   Years of education: Not on file   Highest education level: Not on file  Occupational History   Occupation: retired  Tobacco Use   Smoking status: Former    Packs/day: 1.00    Years: 40.00    Total pack years: 40.00    Types: Cigarettes    Quit date: 05/10/2003    Years since quitting: 18.9   Smokeless tobacco: Never  Vaping Use   Vaping Use: Never used  Substance and Sexual Activity   Alcohol use: No   Drug use: No   Sexual activity: Not on file  Other Topics Concern   Not on file  Social History Narrative   Not on file   Social Determinants of Health   Financial Resource Strain: Not on file  Food Insecurity: No Food Insecurity (07/25/2018)   Hunger Vital Sign    Worried About Running Out of Food in the Last Year: Never true    Ran Out of Food in the Last Year: Never true  Transportation Needs: No Transportation Needs (07/25/2018)   PRAPARE - THydrologist(Medical): No    Lack of Transportation (Non-Medical): No  Physical Activity: Not on file  Stress: Not on file  Social Connections: Not on file     Family History: The patient's family history includes Heart disease in her father and mother.  ROS:   Review of Systems  Constitutional:  Negative for chills and fever.  HENT:  Negative for nosebleeds and tinnitus.   Eyes:  Negative for blurred vision and pain.  Respiratory:  Negative for cough, hemoptysis, shortness of breath and stridor.   Cardiovascular:  Negative for chest pain, palpitations, orthopnea, claudication, leg swelling and PND.  Gastrointestinal:  Negative for blood in stool, diarrhea, nausea and vomiting.  Genitourinary:  Negative for dysuria and hematuria.  Musculoskeletal:  Negative for falls.   Neurological:  Negative for dizziness, loss of consciousness and headaches.  Psychiatric/Behavioral:  Negative for depression, hallucinations and substance abuse. The patient does not have insomnia.      EKGs/Labs/Other Studies Reviewed:    The following studies were reviewed today:  TTE 09/2021: IMPRESSIONS     1. Left ventricular ejection fraction, by estimation, is 65 to 70%. The  left ventricle has normal function. The left ventricle has no regional  wall motion abnormalities. There is mild left ventricular hypertrophy.  Left ventricular diastolic parameters  are consistent with Grade I diastolic dysfunction (impaired relaxation).   2. Right ventricular systolic function is normal. The right ventricular  size is normal. Tricuspid regurgitation signal is inadequate for assessing  PA pressure.   3. The mitral valve is abnormal. Trivial mitral valve regurgitation.   4. The aortic valve was not well visualized. Aortic valve regurgitation  is not visualized.   5. The inferior vena cava is normal in size with <50% respiratory  variability, suggesting right atrial pressure of 8 mmHg.   Carotid Ultrasound 12/2019: Summary:  Right Carotid: Velocities in the right ICA are consistent with a 1-39%  stenosis.  Left Carotid: Velocities in the left ICA are consistent with a 40-59%  stenosis.   EKG:  EKG is personally reviewed. 04/11/2022: Sinus ***. Rate *** bpm.  10/01/21: Possible sinus tachycardia with low amplitude p-wave vs accelerated junctional rhythm with PVC, Septal infarct ([place] Eric, DO)  Recent Labs: 09/29/2021: B Natriuretic Peptide 212.6 10/03/2021: BUN 41; Creatinine, Ser 1.21; Hemoglobin 13.5; Platelets 296; Potassium 4.1; Sodium 136   Recent Lipid Panel No results found for: "CHOL", "TRIG", "HDL", "CHOLHDL", "VLDL", "LDLCALC", "LDLDIRECT"   Risk Assessment/Calculations:   {Does this patient have ATRIAL FIBRILLATION?[phone]}  No BP recorded.  {Refresh Note  OR Click here to enter BP  :1}***         Physical Exam:    VS:  There were no vitals taken for this visit.    Wt Readings from Last 3 Encounters:  11/02/21 236 lb 6.4 oz (107.2 kg)  10/26/20 201 lb (91.2 kg)  08/14/20 215 lb (97.5 kg)     GEN: Well nourished, well developed in no acute distress HEENT: Normal NECK: No JVD; No carotid bruits LYMPHATICS: No lymphadenopathy CARDIAC: ***RRR, no murmurs, rubs, gallops RESPIRATORY:  Clear to auscultation without rales, wheezing or rhonchi  ABDOMEN: Soft, non-tender, non-distended MUSCULOSKELETAL:  No edema; No deformity  SKIN: Warm and dry NEUROLOGIC:  Alert and oriented x 3 PSYCHIATRIC:  Normal affect   ASSESSMENT:    No diagnosis found. PLAN:    In order of problems listed above:  #Chronic Diastolic HF: Last TTE 86/5784 with LVEF 65-70%, G1DD, no significant valve disease. Currently euvolemic with NYHA class II symptoms. Not on diuretics currently. -Continue farxiga '10mg'$  daily -? Start spiro  #CAD with known  RCA disease: Known distal RCA disease with L>R collaterals. On medical management with no anginal symptoms.  -Continue ASA '81mg'$  daily -Continue zetia '10mg'$  adaily -Continue crestor '40mg'$  daily -Continue metop 12.'5mg'$  XL daily  #Carotid Artery Disease: U/s 2021 with RICA 7-68%, LICA 11-57%. -Continue zetia '10mg'$  adaily -Continue crestor '40mg'$  daily -Continue ASA '81mg'$  daily -Repeat carotids for monitoring  #HTN: Controlled. -Continue irbesartan '75mg'$  daily -Continue metop 12.'5mg'$  XL daily  #HLD: -Continue zetia '10mg'$  adaily -Continue crestor '40mg'$  daily -Goal LDL<70  #DMII: -Management per PCP      {Are you ordering a CV Procedure (e.g. stress test, cath, DCCV, TEE, etc)?   Press F2        :[id number]}  Follow-up:***  Medication Adjustments/Labs and Tests Ordered: Current medicines are reviewed at length with the patient today.  Concerns regarding medicines are outlined above.  No orders of the defined  types were placed in this encounter.  No orders of the defined types were placed in this encounter.  There are no Patient Instructions on file for this visit.    I,Mitra Faeizi,acting as a Education administrator for Freada Bergeron, MD.,have documented all relevant documentation on the behalf of Freada Bergeron, MD,as directed by  Freada Bergeron, MD while in the presence of Freada Bergeron, MD.   ***  Signed, Danford Bad  04/11/2022 7:21 AM    [place]

## 2022-05-11 ENCOUNTER — Encounter: Payer: Self-pay | Admitting: Family Medicine

## 2022-05-11 ENCOUNTER — Non-Acute Institutional Stay: Payer: Medicare Other | Admitting: Family Medicine

## 2022-05-11 DIAGNOSIS — E11649 Type 2 diabetes mellitus with hypoglycemia without coma: Secondary | ICD-10-CM | POA: Insufficient documentation

## 2022-05-11 DIAGNOSIS — J069 Acute upper respiratory infection, unspecified: Secondary | ICD-10-CM | POA: Insufficient documentation

## 2022-05-11 NOTE — Progress Notes (Signed)
[facility] Consult Note Telephone: [phone]  Fax: [phone]    Date of encounter: 05/11/22 12:50 PM PATIENT NAME: [name] [address] [place] [address]   [phone] (home) [phone] (work) DOB: February 25, 1941 MRN: [id number] PRIMARY CARE PROVIDER:    [name], NP,  [address] [place] [address] [phone]  REFERRING PROVIDER:   [name], NP [address] [place],  [place] [address] [phone]  RESPONSIBLE PARTY:    Contact Information     Name Relation Home Work Mobile   [name] Daughter   [phone]   [name]   [phone]        I met face to face with patient in [facility] facility. Palliative Care was asked to follow this patient by consultation request of  [name]* to address advance care planning and complex medical decision making. This is a follow up visit   ASSESSMENT , SYMPTOM MANAGEMENT AND PLAN / RECOMMENDATIONS:   Hypoglycemia associated with type 2 DM. Slightly more than 1/4th of blood sugars below 90 in the last 6 weeks.   Pt attempts self treatment in assisted living and does not always advise staff and stays up late then sleeps late into the day creating difficulties in managing blood sugars. Recommend pt for CGM such as Dexcom where blood sugars are automatically taken and to help smooth out and make frequent adjustments for improved control. It can also be set to alarm to prevent lows/showing trends at least 20 min in advance to allow pt to treat before blood sugar is really low and avoid using simple carbs to raise her blood sugar with rebound hypoglycemia. Recommend continuing Farxiga 10 mg daily. Recommend use of complex carb and protein to treat blood sugars when trend first noted and use of bedtime snack.   If pt not eating until late in the day would recommend to  give Tresiba dose midday rather than am and increase dose to 30 units, give 20 units Tresiba at [facility] after bedtime snack. Recommend lowering Tresiba dose by 5 units if blood sugar < 90.   2.  Upper respiratory tract infection Recent onset, respiratory panel pending. Encourage increased fluid, rest. Can give symptomatic relief with Tylenol 650 mg po q 6 hrs prn and with Robitussin DM for cough/congestion relief.   Advance Care Planning/Goals of Care: Identification of a healthcare agent-HC POA on chart indicates POA serves alone and in the following Order:     [name] (deceased), [name] (son) [name] (daughter) Review of an advance directive document -MOST.  CODE STATUS: MOST as of 10/20/21: Full Code, full scope of treatment with expressed wish to not be maintained on a ventilator long term if function expected to be poor. Antibiotics and IV fluids on case by case time limited basis No feeding tube    Follow up Palliative Care Visit: Palliative care will continue to follow for complex medical decision making, advance care planning, and clarification of goals. Return 4-6 weeks or prn.    This visit was coded based on medical decision making (MDM).  PPS: 40%  HOSPICE ELIGIBILITY/DIAGNOSIS: TBD  Chief Complaint:  Palliative Care is continuing to follow patient for chronic medical management in setting of heart failure.   HISTORY OF PRESENT ILLNESS:  [name] is a  82 y.o. year old female with CAD, venous insufficiency of RLE, DM, HTN, Diastolic CHF, nocturnal hypoxemia with OSA and CPAP, chronic airflow limitation, COPD, exczema, HLD, hx of breast cancer, abdominal hernia, carotid bruit.  Pt had developed a cough yesterday and is seen laying in bed, was sleeping on arrival.  Staff indicated that pt remains isolated in her room by her own choice and thinks the pt is depressed.  Facility staff indicates pt is independent with bathing/dressing and  toileting, her appetite is good.  She began feeling ill yesterday and having some cough and congestion.  She has undergone a respiratory panel at the facility but no results are currently available.  She denies fever, CP, SOB, discolored drainage or sputum.  Is noted wearing her CPAP with nasal pillows.  Facility staff state she tends to stay up late and sleep the majority of the day. Pt does use a rollator for assistance with ambulation. In the period of 04/01/22-05/11/22, pt has had some significant hypoglycemic episodes:  17 of 123 blood sugars  were < 80 and almost all were at 08 or 11 am, an additional 15 blood sugars were < 90. Lowest blood sugar was 50, highest blood sugar was 250, majority were under 180. Highs were generally with 1600 blood sugar. Pt is on a no added salt diet, not carb consistent and has been known to keep unhealthy snacks at bedside for when her blood sugar drops.  History obtained from review of EMR, discussion with facility staff and/or [name].  03/04/22 CBC unremarkable, HGB 12.8 HGB A1c 8.2% CMP cr 0.84, BUN 25, CL 109, CO2 19.4 TSH normal at 1.00 Lipid panel:  TC 122, , HDL 29.0, LDL-C 62, TG 155  I reviewed EMR for available labs, medications, imaging, studies and related documents.  Records reviewed and summarized above.   ROS General: NAD EYES: denies vision changes ENMT: denies dysphagia Cardiovascular: denies chest pain, denies DOE Pulmonary: denies cough, denies increased SOB Abdomen: endorses good appetite, denies constipation, endorses continence of bowel GU: denies dysuria, endorses continence of urine MSK:  denies increased weakness,  no falls reported Skin: denies rashes or wounds Neurological: denies pain, denies insomnia Psych: Endorses positive mood Heme/lymph/immuno: denies bruises, abnormal bleeding  Physical Exam: Current and past weights: 05/09/22 222.9 lbs Constitutional: NAD General: obese female, non-toxic in appearance  ENMT: intact  hearing CV: S1S2, RRR Pulmonary: CTAB, no increased work of breathing, intermittent non-productive cough/congestion, room air-wearing CPAP with nasal pillows at the time of assessment Abdomen: hypo-active BS + 4 quadrants, soft and non tender, large RUQ ventral hernia noted which is non-tender GU: deferred MSK: no sarcopenia, moves all extremities, ambulatory with rollator Skin: warm and dry, no rashes or wounds on visible skin Neuro:  no generalized weakness,  no cognitive impairment Psych: non-anxious affect, Sleepy, arousable and O x 3 Hem/lymph/immuno: no widespread bruising   Thank you for the opportunity to participate in the care of Ms. Sainz.  The palliative care team will continue to follow. Please call our office at [phone] if we can be of additional assistance.   Marijo Conception, FNP -C  COVID-19 PATIENT SCREENING TOOL Asked and negative response unless otherwise noted:   Have you had symptoms of covid, tested positive or been in contact with someone with symptoms/positive test in the past 5-10 days?  Unknown, flu outbreak at facility

## 2022-08-01 ENCOUNTER — Non-Acute Institutional Stay: Payer: Medicare Other | Admitting: Family Medicine

## 2022-08-01 DIAGNOSIS — R1310 Dysphagia, unspecified: Secondary | ICD-10-CM

## 2022-08-01 DIAGNOSIS — J439 Emphysema, unspecified: Secondary | ICD-10-CM

## 2022-08-01 DIAGNOSIS — E118 Type 2 diabetes mellitus with unspecified complications: Secondary | ICD-10-CM

## 2022-08-01 NOTE — Progress Notes (Unsigned)
[facility] Consult Note Telephone: [phone]  Fax: [phone]   Date of encounter: 08/01/22 9:24 AM PATIENT NAME: [name] [address] [place] [address]   [phone] (home) [phone] (work) DOB: 1941/04/05 MRN: [id number] PRIMARY CARE PROVIDER:    [name], NP,  [address] [place] [address] [phone]  REFERRING PROVIDER:   [name], NP [address] [place],  [place] [address] [phone]  RESPONSIBLE PARTY:    Contact Information       Name Relation Home Work Mobile    [place] Daughter     [phone]    Vika, [place]     [phone]     CODE STATUS: MOST as of 10/20/21: Full Code, full scope of treatment with expressed wish to not be maintained on a ventilator long term if function expected to be poor. Antibiotics and IV fluids on case by case time limited basis No feeding tube   ASSESSMENT AND / RECOMMENDATIONS:  PPS: 60%   Hypoglycemia associated with Diabetes Mellitus -   Patient currently on Tresiba 25u or 50u q12 hrs depending on              blood sugars and sliding scale insulin and continues to have              blood sugars ranging within 50s-90s especially with fasting              sugars with highs in afternoons/evenings.  No Dexcom being utilized at this time   Nighttime snack to prevent fasting hypoglycemia   Recommend changing Tresiba dose to midday and begin 30u               in am, 20u QHS, decrease am dose by 5 units if blood sugar              90.              Continue Farxiga, Novolog SSI coverage.  Dysphagia    ST eval pending  Recommend diet change as appropriate pending ST eval   Staff report any increased difficulty with meals and cough              associated with meals   Educate staff/patient regarding appropriate rate of feeding and              deliberate swallow   COPD-   Continue  current medication regimen   Encourage energy conservation as appropriate   Report any increased SOB or changes in respiratory status   Continue use of CPAP QHS   Utilize PRN Albuterol 1-2 puffs q6prn for SOB   Follow up Palliative Care Visit:  Palliative Care continuing to follow up by monitoring for changes in appetite, weight, functional and cognitive status for chronic disease progression and management in agreement with patient's stated goals of care. Next visit In 4-6 weeks or prn.  This visit was coded based on medical decision making (MDM).  Chief Complaint  Palliative Care is continuing to follow patient for chronic medical management in setting of heart failure and diabetes mellitus.   HISTORY OF PRESENT ILLNESS: [name] is a 82 y.o. year old female with Diastolic CHF, DM with hypoglycemia, and COPD.  Seen by facility NP on 07/12/22 with orders for ST eval, had recent dermatologic procedure to treat lesion of finger.  AM blood sugars low at 55-90 when low with afternoon/evening greater  than 200. Seen sitting up in chair at beside eating breakfast.  Refuses vital signs and assessment at this time. Staff report some anxiety and slight agitation with care at times. Staff also report increased cough with eating. Patient does not volunteer much information as she does not recognize [place]. Observed eating french toast with noted cough. Tends to be up all night and sleep days or at least until late morning/midday.   ACTIVITIES OF DAILY LIVING: CONTINENT OF BLADDER/BOWEL? Y BATHING/DRESSING/FEEDING - Requires supervision for safety   MOBILITY:   AMBULATORY WITH ASSISTIVE DEVICE: [facility]   APPETITE? Good   CURRENT PROBLEM LIST:  Patient Active Problem List   Diagnosis Date Noted   Hypoglycemia associated with type 2 diabetes mellitus ([place]) 05/11/2022   Upper respiratory tract infection 123456   Acute diastolic CHF (congestive heart failure) ([place]) 10/01/2021   Dyspnea on  exertion 09/30/2021   Abnormal finding on breast imaging 09/30/2021   Abdominal hernia 09/30/2021   SOB (shortness of breath) 09/30/2021   Breast cancer ([place])    COPD (chronic obstructive pulmonary disease) ([place]) 07/08/2016   Acute bronchitis 04/26/2016   Asthma with acute exacerbation 04/26/2016   Obesity (BMI 30-39.9) 12/20/2014   Chest pain 11/11/2014   Breast cancer, female ([place]) 11/11/2014   Benign neoplasm of colon 11/11/2014   CAFL (chronic airflow limitation) ([place]) 11/11/2014   Clinical depression 11/11/2014   Major depressive episode 11/11/2014   Diabetes mellitus with peripheral circulatory disorder ([place]) 11/11/2014   Diabetes mellitus, type 2 ([place]) 11/11/2014   Diabetes mellitus type 2, uncontrolled 11/11/2014   Benign essential HTN 11/11/2014   Combined fat and carbohydrate induced hyperlipemia 11/11/2014   Adiposity 11/11/2014   Bone/cartilage disorder 11/11/2014   Blood clot associated with vein wall inflammation 11/11/2014   Hypercholesterolemia without hypertriglyceridemia 11/11/2014   Peripheral blood vessel disorder ([place]) 11/11/2014   Left foot pain 11/11/2014   Dysesthesia 11/11/2014   Carotid bruit 11/11/2014   COPD GOLD 0 vs asthma 08/26/2014   Venous insufficiency of right lower extremity 08/26/2014   OSA (obstructive sleep apnea) 01/20/2014   Type II diabetes mellitus with complication ([place]) 123456   Abnormal echocardiogram 12/11/2013   Dermatitis, eczematoid 12/11/2013   Coronary artery disease involving native coronary artery of native heart with angina pectoris ([place]) 06/10/2013   Nocturnal hypoxemia 10/24/2012   Hyperlipidemia 05/22/2008   Dyspnea 05/22/2008   NEOPLASM, MALIGNANT, BREAST, HX OF 05/22/2008   PAST MEDICAL HISTORY:  Active Ambulatory Problems    Diagnosis Date Noted   Hyperlipidemia 05/22/2008   Dyspnea 05/22/2008   NEOPLASM, MALIGNANT, BREAST, HX OF 05/22/2008   Nocturnal hypoxemia 10/24/2012   Coronary artery disease  involving native coronary artery of native heart with angina pectoris ([place]) 06/10/2013   Type II diabetes mellitus with complication ([place]) 123456   OSA (obstructive sleep apnea) 01/20/2014   COPD GOLD 0 vs asthma 08/26/2014   Venous insufficiency of right lower extremity 08/26/2014   Abnormal echocardiogram 12/11/2013   Chest pain 11/11/2014   Breast cancer, female ([place]) 11/11/2014   Benign neoplasm of colon 11/11/2014   CAFL (chronic airflow limitation) ([place]) 11/11/2014   Clinical depression 11/11/2014   Major depressive episode 11/11/2014   Dermatitis, eczematoid 12/11/2013   Diabetes mellitus with peripheral circulatory disorder ([place]) 11/11/2014   Diabetes mellitus, type 2 ([place]) 11/11/2014   Diabetes mellitus type 2, uncontrolled 11/11/2014   Benign essential HTN 11/11/2014   Combined fat and carbohydrate induced hyperlipemia 11/11/2014   Adiposity 11/11/2014   Bone/cartilage disorder 11/11/2014  Blood clot associated with vein wall inflammation 11/11/2014   Hypercholesterolemia without hypertriglyceridemia 11/11/2014   Peripheral blood vessel disorder ([place]) 11/11/2014   Left foot pain 11/11/2014   Dysesthesia 11/11/2014   Carotid bruit 11/11/2014   Obesity (BMI 30-39.9) 12/20/2014   Acute bronchitis 04/26/2016   Asthma with acute exacerbation 04/26/2016   COPD (chronic obstructive pulmonary disease) ([place]) 07/08/2016   Breast cancer ([place])    Dyspnea on exertion 09/30/2021   Abnormal finding on breast imaging 09/30/2021   Abdominal hernia 09/30/2021   SOB (shortness of breath) [id number]   Acute diastolic CHF (congestive heart failure) ([place]) 10/01/2021   Hypoglycemia associated with type 2 diabetes mellitus ([place]) 05/11/2022   Upper respiratory tract infection 05/11/2022   Resolved Ambulatory Problems    Diagnosis Date Noted   Essential hypertension 05/22/2008   Arteriosclerosis of coronary artery 11/11/2014   Past Medical History:  Diagnosis Date   Asthma     Coronary atherosclerosis    Depression    DM (diabetes mellitus) ([place])    Hypertension    Osteopenia    Personal history of radiation therapy    PVD (peripheral vascular disease) ([place])       Preferred Pharmacy: ALLERGIES:  Allergies  Allergen Reactions   Niacin Hives and Shortness Of Breath   Penicillins Other (See Comments)    Did it involve swelling of the face/tongue/throat, SOB, or low BP? Y Did it involve sudden or severe rash/hives, skin peeling, or any reaction on the inside of your mouth or nose? UNK Did you need to seek medical attention at a hospital or doctor's office? N When did it last happen? Can't remember   If all above answers are "NO", may proceed with cephalosporin use.    Saccharin Palpitations    Allergen: [id number] Dye+methocarbamol+saccharin (Saccharin)   Clindamycin/Lincomycin Other (See Comments)    Unknown reaction - listed on MAR   Codeine Other (See Comments)    Night-mare   Diclofenac Sodium Hives   Erythromycin Diarrhea    GI upset   Glipizide Other (See Comments)    GI upset   Iodinated Contrast Media Other (See Comments)    Unknown reaction - listed on MAR   Latex Other (See Comments)    Unknown reaction - listed on MAR   Macrolides And Ketolides Other (See Comments)    Unknown reaction - listed on MAR   Metformin And Related Diarrhea   Methocarbamol Other (See Comments)    Tarchycardia   Ramipril Other (See Comments)    Unknown reaction   Sulfa Antibiotics Diarrhea   Trimox [Amoxicillin] Other (See Comments)    GI upset   Tetanus Toxoid Itching, Swelling and Rash     PERTINENT MEDICATIONS:  Outpatient Encounter Medications as of 08/01/2022  Medication Sig   acetaminophen (TYLENOL) 500 MG tablet Take 500 mg by mouth every 12 (twelve) hours as needed (general pain).   albuterol (PROAIR HFA) 108 (90 Base) MCG/ACT inhaler Inhale 1-2 puffs into the lungs every 6 (six) hours as needed for wheezing or shortness of breath. (Patient taking  differently: Inhale 2 puffs into the lungs every 4 (four) hours as needed for wheezing or shortness of breath.)   aspirin EC 81 MG tablet Take 81 mg by mouth every morning. Swallow whole.   azelastine (ASTELIN) 0.1 % nasal spray Place 2 sprays into both nostrils 2 (two) times daily as needed for rhinitis.   Cholecalciferol (VITAMIN D) 50 MCG (2000 UT) tablet Take 2,000 Units by mouth every  morning.   dapagliflozin propanediol (FARXIGA) 10 MG TABS tablet Take 1 tablet (10 mg total) by mouth daily before breakfast.   ezetimibe (ZETIA) 10 MG tablet Take 10 mg by mouth every morning.   fluticasone (FLONASE) 50 MCG/ACT nasal spray Place 1 spray into both nostrils daily.   glucose 4 GM chewable tablet Chew 1 tablet by mouth as needed for low blood sugar.   insulin aspart (NOVOLOG) 100 UNIT/ML injection Inject 6-12 Units into the skin See admin instructions. Inject 6-12 units subcutaneously per sliding scale: CBG 100-150  6 units, 151-200 8 units, 201-250 10 units, 251 +  12 units and notify provider   insulin degludec (TRESIBA FLEXTOUCH) 100 UNIT/ML FlexTouch Pen Inject 25-50 Units into the skin See admin instructions. Inject 50 units subcutaneously twice daily, inject 25 units every 12 hours as needed for BS <100   irbesartan (AVAPRO) 75 MG tablet Take 1 tablet (75 mg total) by mouth daily.   isosorbide mononitrate (IMDUR) 60 MG 24 hr tablet TAKE 1 TABLET BY MOUTH ONCE DAILY --PLEASE  KEEP  UPCOMIMG  APPT  FOR  FURTHER  REFILLS   loratadine (CLARITIN) 10 MG tablet Take 10 mg by mouth every morning.   metoprolol succinate (TOPROL-XL) 25 MG 24 hr tablet Take 0.5 tablets (12.5 mg total) by mouth daily.   metoprolol succinate (TOPROL-XL) 25 MG 24 hr tablet Take 12.5 mg by mouth daily.   rosuvastatin (CRESTOR) 40 MG tablet Take 40 mg by mouth every morning.   sertraline (ZOLOFT) 100 MG tablet Take 100 mg by mouth every morning.   umeclidinium-vilanterol (ANORO ELLIPTA) 62.5-25 MCG/ACT AEPB Inhale 1 puff into  the lungs daily.   No facility-administered encounter medications on file as of 08/01/2022.    History obtained from review of EMR, discussion with facility staff/caregiver and/or patient.   Facility labs remarkable in 06/10/2022 for: Hgb A1c elevated at 7.5%  eGFR 55, bun/Cr ratio 29 Triglycerides 155  I reviewed EMR for available labs, medications, imaging, studies and related documents. Records reviewed and summarized above.   Physical Exam: Limited Assessment observation due to patient refusal  Current Weight 222.9 07/21/22 GENERAL: NAD, obese well developed Female LUNGS: no increased work of breathing, room air, slight cough with eating CARDIAC:  Able to speak in complete sentences, no cyanosis ABD:  Unable to assess due to patient refusal  EXTREMITIES:  no deformity, Using all 4 extremities INTEG: Warm/Dry, no rashes, bruises or wounds visible  NEURO:  no involuntary movements PSYCH:  non-anxious affect, A & O x 3, slight agitation and skeptical regarding visit as unable to recall who provider is with and last visit   Thank you for the opportunity to participate in the care of [employer]. Please call our main office at [phone] if we can be of additional assistance.    Damaris Hippo FNP-C  [email or web address] .[facility]  Phone:  [phone]

## 2022-08-03 ENCOUNTER — Encounter: Payer: Self-pay | Admitting: Family Medicine

## 2022-08-19 ENCOUNTER — Encounter: Payer: Self-pay | Admitting: Family Medicine

## 2022-08-19 ENCOUNTER — Non-Acute Institutional Stay: Payer: Medicare Other | Admitting: Family Medicine

## 2022-08-19 VITALS — BP 159/64 | HR 69 | Temp 97.4°F | Resp 18

## 2022-08-19 DIAGNOSIS — K439 Ventral hernia without obstruction or gangrene: Secondary | ICD-10-CM

## 2022-08-19 DIAGNOSIS — J439 Emphysema, unspecified: Secondary | ICD-10-CM

## 2022-08-19 DIAGNOSIS — E11649 Type 2 diabetes mellitus with hypoglycemia without coma: Secondary | ICD-10-CM

## 2022-08-19 DIAGNOSIS — J9601 Acute respiratory failure with hypoxia: Secondary | ICD-10-CM | POA: Insufficient documentation

## 2022-08-19 NOTE — Progress Notes (Signed)
Therapist, nutritional Palliative Care Consult Note Telephone: [phone]  Fax: [phone]   Date of encounter: 08/19/22 9:23 AM PATIENT NAME: [name] [address] [place] [address]   [phone] (home) [phone] (work) DOB: [birth year] MRN: [id number] PRIMARY CARE PROVIDER:    Joycelyn Man, NP,  [address] [place] [address] [phone]  REFERRING PROVIDER:   Joycelyn Man, NP [address] [place],  [place] [address] [phone]  RESPONSIBLE PARTY:    Contact Information       Name Relation Home Work Mobile    [place] Daughter     [phone]    [name], [name]     [phone]   ADVANCE CARE PLANNING/GOALS OF CARE: Patient and health care surrogate gave their permission to discuss.   PRESENT FOR DISCUSSION: Individually, Katherine Man FNP-C met with both patient and her daughter, [name]  GOALS:  Be comfortable in her current surroundings, only go to the hospital or have any type of aggressive intervention if needed for comfort.  BARRIERS: None at present  HEALTH CARE POA AND PHONE NUMBER: [name]    Our advance care planning conversation included a discussion about:     Exploration of personal, cultural or spiritual beliefs that might influence medical decisions- pt states she does not want CPR as she wants to avoid rib fractures and "if it is my time to go I want to go."  Review, Updating of an advance directive document-DNR and MOST Spoke with daughter [name] who is [facility] POA, advised of patient's revised wishes and she states that her mother has been refusing to go to any of her specialists for almost a year now.  Assessment/Plan: Palliative Care Counselling:  CODE STATUS: MOST revised per pt's wishes 08/19/22: DNR/DNI with comfort measures DO not transfer to hospital unless comfort needs cannot be met Use of antibiotics and IV fluids on a case by case, time  limited basis No feeding tube.     I spent 47 minutes providing this consultation with more than 50% of the time spent on counseling patient and coordinating communication with referring provider, staff/family, chart review and documentation. ----------------------------------------------------------------------------------------------------------------------------------------------      ASSESSMENT AND / RECOMMENDATIONS:  PPS: 60%   Acute respiratory failure likely due to COPD Stable, question possible early exacerbation. Recommend O2 @ 2L [place] continuous and periodic pulse oximetry monitoring. Continue Anoro ellipta for maintenance, flonase and astelin for allergy/PND, and Albuterol for SOB/DOE.  Chest Pain Symptomatic in moments of high anxiety Does not desire transport to hospital or follow up with specialist. Continue Metoprolol with parameters to hold for pulse < 60 Continue Imdur 60 mg daily. Revised goals of care per pt's wishes.  Hypoglycemia associated with Diabetes Mellitus   Stable with isolated episode of hypoglycemia currently. Has hypoglycemic awareness Continue current insulin regimen and Farxciga, HGB A1c at goal of 7.5%    Ventral hernia without gangrene or obstruction Expectant management as long as asymptomatic Educated staff and pt that as long as is not tender, erythematous, hot or growing significantly, no nausea/vomiting or change in bowel habit can monitor.  Follow up Palliative Care Visit:  Palliative Care continuing to follow up by monitoring for changes in appetite, weight, functional and cognitive status for chronic disease progression and management in agreement with patient's stated goals of care. Next visit In 4-6 weeks or prn.  This visit was coded based on medical decision making  (MDM).  Chief Complaint  Palliative Care is continuing to follow patient for chronic medical management of symptoms with recent c/o chest pain with intermittent bradycardia in 50s and hypoxia. Currently is full code. Full intervention.  HISTORY OF PRESENT ILLNESS: [name] is a 82 y.o. year old female with Diastolic CHF, DM and COPD with recent c/o CP, noted intermittent bradycardia and hypoxia.    AM blood sugars predominantly 80-180 with an isolated 61 and 220s x 2.  HS sugars are less than 200.  Last HGB A1c was 7.5%.  Received call from health and wellness director yesterday that pt c/o chest pain, exhibiting some bradycardia and hypoxia.  She is on Metoprolol and Imdur. Pt was seen returning from the bathroom with med tech doing VS and giving meds when she was noted to have O2 sat initially of 85% with gradual increase to 89% with rest on room air. Per facility staff she does not leave her room and sleeps a lot.  Pt's daughter does not want pt to go out for follow up with specialists like Cardiology or Pulmonology but currently is listed as full code and full intervention on MOST, signed by pt in June 2023.  Daughter on talking with her indicated that the patient has not wanted to follow up with specialists that she will make the appointment and that the day of the patient will refuse to go.  Pt had an argument with her roommate last night over the volume of her TV. BP was elevated at 150s/60s.  She has had trouble with  CPAP and is being sent for maintenance.  Blood sugar 91.  Patient has an epigastric area ventral hernia which she states has gotten slightly larger but is not red, hot or painful, denies change in bowel habits, nausea or vomiting. Patient wants to complete new MOST form and make changes.  Daughter, [name], updated with regard to need for O2 and message sent to Kelli Hope, NP, pt's updated wishes with regard to MOST and that only other finding of concern today was a epigastric  area ventral hernia. Daughter gave her email as [email or web address] and is agreeable to receiving post-visit feedback via secure email and urgent updates by phone.  08/19/22   11:30 am Received call back from Kelli Hope, NP who is PCP at facility for pt. Advised of findings and need for O2 support with hypoxia likely secondary to COPD and deconditioning.  Will fax face to face note to facility who will order home O2 for patient hopefully to be delivered today.  Discussed findings with Toni Amend and she is in agreement with current plan.  ACTIVITIES OF DAILY LIVING: CONTINENT OF BLADDER-no/BOWEL? Yes BATHING/DRESSING/FEEDING - Requires supervision for safety   MOBILITY:   AMBULATORY WITH ASSISTIVE DEVICE: Rollator   APPETITE? Good   Weight in February 24 223 lbs, weight in March 222 lbs  CURRENT PROBLEM LIST:  Patient Active Problem List   Diagnosis Date Noted   Hypoglycemia associated with type 2 diabetes mellitus 05/11/2022   Upper respiratory tract infection 05/11/2022   Acute diastolic CHF (congestive heart failure) 10/01/2021   Dyspnea on exertion 09/30/2021   Abnormal finding on breast imaging  09/30/2021   Abdominal hernia 09/30/2021   SOB (shortness of breath) 09/30/2021   Breast cancer    COPD (chronic obstructive pulmonary disease) 07/08/2016   Acute bronchitis 04/26/2016   Asthma with acute exacerbation 04/26/2016   Obesity (BMI 30-39.9) 12/20/2014   Chest pain 11/11/2014   Breast cancer, female 11/11/2014   Benign neoplasm of colon 11/11/2014   CAFL (chronic airflow limitation) 11/11/2014   Clinical depression 11/11/2014   Major depressive episode 11/11/2014   Diabetes mellitus with peripheral circulatory disorder 11/11/2014   Diabetes mellitus, type 2 (HCC) 11/11/2014   Diabetes mellitus type 2, uncontrolled 11/11/2014   Benign essential HTN 11/11/2014   Combined fat and carbohydrate induced hyperlipemia 11/11/2014   Adiposity 11/11/2014   Bone/cartilage disorder  11/11/2014   Blood clot associated with vein wall inflammation 11/11/2014   Hypercholesterolemia without hypertriglyceridemia 11/11/2014   Peripheral blood vessel disorder (HCC) 11/11/2014   Left foot pain 11/11/2014   Dysesthesia 11/11/2014   Carotid bruit 11/11/2014   COPD GOLD 0 vs asthma 08/26/2014   Venous insufficiency of right lower extremity 08/26/2014   OSA (obstructive sleep apnea) 01/20/2014   Type II diabetes mellitus with complication 01/15/2014   Abnormal echocardiogram 12/11/2013   Dermatitis, eczematoid 12/11/2013   Coronary artery disease involving native coronary artery of native heart with angina pectoris 06/10/2013   Nocturnal hypoxemia 10/24/2012   Hyperlipidemia 05/22/2008   Dyspnea 05/22/2008   NEOPLASM, MALIGNANT, BREAST, HX OF 05/22/2008   PAST MEDICAL HISTORY:  Active Ambulatory Problems    Diagnosis Date Noted   Hyperlipidemia 05/22/2008   Dyspnea 05/22/2008   NEOPLASM, MALIGNANT, BREAST, HX OF 05/22/2008   Nocturnal hypoxemia 10/24/2012   Coronary artery disease involving native coronary artery of native heart with angina pectoris 06/10/2013   Type II diabetes mellitus with complication 01/15/2014   OSA (obstructive sleep apnea) 01/20/2014   COPD GOLD 0 vs asthma 08/26/2014   Venous insufficiency of right lower extremity 08/26/2014   Abnormal echocardiogram 12/11/2013   Chest pain 11/11/2014   Breast cancer, female 11/11/2014   Benign neoplasm of colon 11/11/2014   CAFL (chronic airflow limitation) 11/11/2014   Clinical depression 11/11/2014   Major depressive episode 11/11/2014   Dermatitis, eczematoid 12/11/2013   Diabetes mellitus with peripheral circulatory disorder 11/11/2014   Diabetes mellitus, type 2 (HCC) 11/11/2014   Diabetes mellitus type 2, uncontrolled 11/11/2014   Benign essential HTN 11/11/2014   Combined fat and carbohydrate induced hyperlipemia 11/11/2014   Adiposity 11/11/2014   Bone/cartilage disorder 11/11/2014   Blood clot  associated with vein wall inflammation 11/11/2014   Hypercholesterolemia without hypertriglyceridemia 11/11/2014   Peripheral blood vessel disorder (HCC) 11/11/2014   Left foot pain 11/11/2014   Dysesthesia 11/11/2014   Carotid bruit 11/11/2014   Obesity (BMI 30-39.9) 12/20/2014   Acute bronchitis 04/26/2016   Asthma with acute exacerbation 04/26/2016   COPD (chronic obstructive pulmonary disease) 07/08/2016   Breast cancer    Dyspnea on exertion 09/30/2021   Abnormal finding on breast imaging 09/30/2021   Abdominal hernia 09/30/2021   SOB (shortness of breath) 09/30/2021   Acute diastolic CHF (congestive heart failure) 10/01/2021   Hypoglycemia associated with type 2 diabetes mellitus 05/11/2022   Upper respiratory tract infection 05/11/2022   Resolved Ambulatory Problems    Diagnosis Date Noted   Essential hypertension 05/22/2008   Arteriosclerosis of coronary artery 11/11/2014   Past Medical History:  Diagnosis Date   Asthma    Coronary atherosclerosis    Depression    DM (diabetes  mellitus) (HCC)    Hypertension    Osteopenia    Personal history of radiation therapy    PVD (peripheral vascular disease) (HCC)       Preferred Pharmacy: ALLERGIES:  Allergies  Allergen Reactions   Niacin Hives and Shortness Of Breath   Penicillins Other (See Comments)    Did it involve swelling of the face/tongue/throat, SOB, or low BP? Y Did it involve sudden or severe rash/hives, skin peeling, or any reaction on the inside of your mouth or nose? UNK Did you need to seek medical attention at a hospital or doctor's office? N When did it last happen? Can't remember   If all above answers are "NO", may proceed with cephalosporin use.    Saccharin Palpitations    Allergen: ZOX:WRUEAV Dye+methocarbamol+saccharin (Saccharin)   Clindamycin/Lincomycin Other (See Comments)    Unknown reaction - listed on MAR   Codeine Other (See Comments)    Night-mare   Diclofenac Sodium Hives    Erythromycin Diarrhea    GI upset   Glipizide Other (See Comments)    GI upset   Iodinated Contrast Media Other (See Comments)    Unknown reaction - listed on MAR   Latex Other (See Comments)    Unknown reaction - listed on MAR   Macrolides And Ketolides Other (See Comments)    Unknown reaction - listed on MAR   Metformin And Related Diarrhea   Methocarbamol Other (See Comments)    Tarchycardia   Ramipril Other (See Comments)    Unknown reaction   Sulfa Antibiotics Diarrhea   Trimox [Amoxicillin] Other (See Comments)    GI upset   Tetanus Toxoid Itching, Swelling and Rash     PERTINENT MEDICATIONS:  Outpatient Encounter Medications as of 08/19/2022  Medication Sig   acetaminophen (TYLENOL) 500 MG tablet Take 500 mg by mouth every 12 (twelve) hours as needed (general pain).   albuterol (PROAIR HFA) 108 (90 Base) MCG/ACT inhaler Inhale 1-2 puffs into the lungs every 6 (six) hours as needed for wheezing or shortness of breath. (Patient taking differently: Inhale 2 puffs into the lungs every 4 (four) hours as needed for wheezing or shortness of breath.)   aspirin EC 81 MG tablet Take 81 mg by mouth every morning. Swallow whole.   azelastine (ASTELIN) 0.1 % nasal spray Place 2 sprays into both nostrils 2 (two) times daily as needed for rhinitis.   Cholecalciferol (VITAMIN D) 50 MCG (2000 UT) tablet Take 2,000 Units by mouth every morning.   dapagliflozin propanediol (FARXIGA) 10 MG TABS tablet Take 1 tablet (10 mg total) by mouth daily before breakfast.   ezetimibe (ZETIA) 10 MG tablet Take 10 mg by mouth every morning.   fluticasone (FLONASE) 50 MCG/ACT nasal spray Place 1 spray into both nostrils daily.   glucose 4 GM chewable tablet Chew 1 tablet by mouth as needed for low blood sugar.   insulin aspart (NOVOLOG) 100 UNIT/ML injection Inject 6-12 Units into the skin See admin instructions. Inject 6-12 units subcutaneously per sliding scale: CBG 100-150  6 units, 151-200 8 units, 201-250  10 units, 251 +  12 units and notify provider   insulin degludec (TRESIBA FLEXTOUCH) 100 UNIT/ML FlexTouch Pen Inject 25-50 Units into the skin See admin instructions. Inject 50 units subcutaneously twice daily, inject 25 units every 12 hours as needed for BS <100   irbesartan (AVAPRO) 75 MG tablet Take 1 tablet (75 mg total) by mouth daily.   isosorbide mononitrate (IMDUR) 60 MG 24 hr  tablet TAKE 1 TABLET BY MOUTH ONCE DAILY --PLEASE  KEEP  UPCOMIMG  APPT  FOR  FURTHER  REFILLS   loratadine (CLARITIN) 10 MG tablet Take 10 mg by mouth every morning.   metoprolol succinate (TOPROL-XL) 25 MG 24 hr tablet Take 12.5 mg by mouth daily.   nystatin (MYCOSTATIN/NYSTOP) powder Apply 1 Application topically 2 (two) times daily as needed (itching and redness breast and groin).   rosuvastatin (CRESTOR) 40 MG tablet Take 40 mg by mouth every morning.   sertraline (ZOLOFT) 100 MG tablet Take 100 mg by mouth every morning.   umeclidinium-vilanterol (ANORO ELLIPTA) 62.5-25 MCG/ACT AEPB Inhale 1 puff into the lungs daily.   No facility-administered encounter medications on file as of 08/19/2022.    History obtained from review of EMR, discussion with PCP, family, facility staff/caregiver and/or patient.   Facility labs remarkable in 06/10/2022 for: Hgb A1c elevated at 7.5%  eGFR 55, bun/Cr ratio 29 Triglycerides 155  I reviewed EMR for available labs, medications, imaging, studies and related documents. Records reviewed and summarized above.   Physical Exam:  Current Weight 222.9 07/21/22, was 223 in January GENERAL: NAD, obese well developed female LUNGS: Fine crackles in RLL, otherwise clear, no increased work of breathing, room air, occasional non-productive cough CARDIAC:  Normal S1S2.  RRR without MRG, no Able to speak in complete sentences, no edema/cyanosis ABD:  Central epigastric area ventral hernia approximately 7-8 cm in diameter, soft, non-tender and easily reducible.  BS + x 4 quads EXTREMITIES:  Equal strength in all 4 extremities, no deformity, Ambulates with steady gait with rolling walker INTEG: Warm/Dry, no rashes, bruises or wounds visible  NEURO:  no involuntary movements PSYCH:  non-anxious affect, A & O x 3  Thank you for the opportunity to participate in the care of [facility]. Please call our main office at [phone] if we can be of additional assistance.    Katherine Man FNP-C  [email or web address] .[facility]  Phone:  [phone]

## 2022-09-20 ENCOUNTER — Telehealth: Payer: Self-pay | Admitting: Family Medicine

## 2022-09-20 NOTE — Telephone Encounter (Signed)
TCF Melissa, DON at [facility] in [place].  She states pt having intermittent hypoglycemia with orders requesting adjustment of insulin based on blood sugar. Orders from PCP are to give Tresiba 50 units BID unless blood sugar < 100, then give only 25 units.  Pt has had 4 am blood sugars less than 60 and refuses to go to her Endocrinologist.  Efraim Kaufmann is looking for assistance in managing.  She states pm dose has been varying so sometimes she gets 25 units and at others she gets 50 units.  Otherwise her blood sugars are primarily running in the 140-160s with some occasional 240s.  Advised her to change the pm order to give Tresiba 25 units and if pt has not had a HGB A1c in the last 3 months to obtain a HGB A1c and BMP to provide renal function.  Will make visit in am to review and make changes.  She is agreeable.  Shana Chute FNP-C

## 2022-09-21 ENCOUNTER — Non-Acute Institutional Stay: Payer: Medicare Other | Admitting: Family Medicine

## 2022-09-21 ENCOUNTER — Encounter: Payer: Self-pay | Admitting: Family Medicine

## 2022-09-21 VITALS — BP 128/50 | HR 74 | Temp 97.8°F | Resp 20 | Wt 224.0 lb

## 2022-09-21 DIAGNOSIS — F329 Major depressive disorder, single episode, unspecified: Secondary | ICD-10-CM

## 2022-09-21 DIAGNOSIS — J439 Emphysema, unspecified: Secondary | ICD-10-CM

## 2022-09-21 DIAGNOSIS — E11649 Type 2 diabetes mellitus with hypoglycemia without coma: Secondary | ICD-10-CM

## 2022-09-21 NOTE — Progress Notes (Signed)
Therapist, nutritional Palliative Care Consult Note Telephone: [phone]  Fax: [phone]   Date of encounter: 08/19/22 9:23 AM PATIENT NAME: [name] [address] [place] [address]   [phone] (home) [phone] (work) DOB: 06/18/1940 MRN: [id number] PRIMARY CARE PROVIDER:    Joycelyn Man, NP,  [address] [place] [address] [phone]  REFERRING PROVIDER:   Joycelyn Man, NP [address] [place],  [place] [address] [phone]  RESPONSIBLE PARTY:    Contact Information       Name Relation Home Work Mobile    [place] Daughter     [phone]    Julianni, Shuba     [phone]    [facility]ALTH [facility] [place] AND PHONE NUMBER: Madeira Ferns     CODE STATUS: MOST as of 08/19/22: DNR/DNI with comfort measures DO not transfer to hospital unless comfort needs cannot be met Use of antibiotics and IV fluids on a case by case, time limited basis No feeding tube.         ASSESSMENT AND / RECOMMENDATIONS:  PPS: 60%   Hypoglycemia associated with Diabetes Mellitus   Stable with multiple recent episodes of hypoglycemia predominantly fasting. Change Tresiba to 50 units in am, 25 units QHS, decrease am dose to 45 units in am if blood sugar < 100. Continue current SSI Novolog 101-150, 6 units, 151-200, 8 units; 201-250, 10 unit, 251+ 12 units. Rotate sites from right arm or bilateral thighs [place] for sliding scale. Continue Farxciga, HGB A1c at goal of 7.5%    Chronic respiratory failure with COPD Stable. Occasional hypoxia noted when pt non-compliant with wearing O2 Continuous O2 @ 2L [place]. Continue Anoro ellipta for maintenance, flonase and astelin for allergy/PND, and Albuterol for SOB/DOE.  Major Depressive Disorder, recurrent/remission status unspecified Suspect active, on Sertraline 100 mg daily. Suspect this is largest contributor to compliance issues. Pt may benefit from change in  SSRI to something like Lexapro versus addition of possible Abilify.                                                                                                                                                            Follow up Palliative Care Visit:  Palliative Care continuing to follow up by monitoring for changes in appetite, weight, functional and cognitive status for chronic disease progression and management in agreement with patient's stated goals of care. Next visit in 4 weeks or prn.  This visit was coded based on medical decision making (MDM).  Chief Complaint  Palliative Care is continuing to follow patient for chronic medical management of symptoms with recent hypoglycemic episodes with pt refusing to go see specialists.   HISTORY OF PRESENT ILLNESS: [name] is a 82 y.o. year old female with Diastolic  CHF, DM and COPD with recent  multiple episodes of hypoglycemia despite titration of insulin.   Last HGB A1c was 7.4% done at the facility on 08/26/22.  Received call from health and wellness director yesterday that pt has been having more hypoglycemia episodes recently with titrated Tresiba doses, halving the dose from 50 units to 25 units if blood sugar < 100.   Pt does not want to follow up with specialists and has requested DNR status.  Daughter gave her email as [email or web address] and was agreeable to receiving post-visit feedback via secure email and urgent updates by phone.  Pt has had 1 low blood sugar before lunch (61), 1 at bedtime (54) and 3 in the last 2.5 weeks for fasting blood sugars (63, 56 and 54).  Her average over this time is 84.5 units [place] daily and 19 units of Novolog.  Review of the last 2 weeks shows pt receiving 4-5 injections daily, all being given in rotation only in the abdomen and pt has limited area due to large ventral hernia.  Pt denies nausea, vomiting, dysuria, constipation, has long term chronic cough which is unchanged in volume of sputum  production or color.  Denies worsening of baseline SOB, fever. States she does not remember her blood sugars being lower recently and was advised that overall her HGB A1c is pretty decent as of last month but low sugars are higher risk for deterioration.  Advised of need to rotate sites and she states being unaware of the need for this rotation.  Advised that she would likely have better absorption if she was using her arms/legs as well.  She states that she will not take any insulin in her left arm.  Advised that she might take her Evaristo Bury in the abdomen rotating but her SSI could be in the right arm or legs. She was noted moving around in her room and was not wearing her O2.  When she sat down and her O2 sat was obtained she was 89% and gradually improved to 90% on room air.  Encouraged to wear her O2.  Pt expressed skepticism of information provided, will try to provide educational material and encouraged her to discuss with her PCP.    ACTIVITIES OF DAILY LIVING: CONTINENT OF BLADDER-no/BOWEL? Yes BATHING/DRESSING/FEEDING - Requires supervision for safety   MOBILITY:   AMBULATORY WITH ASSISTIVE DEVICE: Rollator   APPETITE? Good   Weight in May 1, 224 lbs, weight in March 222 lbs  CURRENT PROBLEM LIST:  Patient Active Problem List   Diagnosis Date Noted   Hypoglycemia associated with type 2 diabetes mellitus 05/11/2022   Upper respiratory tract infection 05/11/2022   Acute diastolic CHF (congestive heart failure) 10/01/2021   Dyspnea on exertion 09/30/2021   Abnormal finding on breast imaging 09/30/2021   Abdominal hernia 09/30/2021   SOB (shortness of breath) 09/30/2021   Breast cancer    COPD (chronic obstructive pulmonary disease) 07/08/2016   Acute bronchitis 04/26/2016   Asthma with acute exacerbation 04/26/2016   Obesity (BMI 30-39.9) 12/20/2014   Chest pain 11/11/2014   Breast cancer, female 11/11/2014   Benign neoplasm of colon 11/11/2014   CAFL (chronic airflow  limitation) 11/11/2014   Clinical depression 11/11/2014   Major depressive episode 11/11/2014   Diabetes mellitus with peripheral circulatory disorder 11/11/2014   Diabetes mellitus, type 2 (HCC) 11/11/2014   Diabetes mellitus type 2, uncontrolled 11/11/2014   Benign essential HTN 11/11/2014   Combined fat and carbohydrate induced hyperlipemia 11/11/2014   Adiposity  11/11/2014   Bone/cartilage disorder 11/11/2014   Blood clot associated with vein wall inflammation 11/11/2014   Hypercholesterolemia without hypertriglyceridemia 11/11/2014   Peripheral blood vessel disorder (HCC) 11/11/2014   Left foot pain 11/11/2014   Dysesthesia 11/11/2014   Carotid bruit 11/11/2014   COPD GOLD 0 vs asthma 08/26/2014   Venous insufficiency of right lower extremity 08/26/2014   OSA (obstructive sleep apnea) 01/20/2014   Type II diabetes mellitus with complication 01/15/2014   Abnormal echocardiogram 12/11/2013   Dermatitis, eczematoid 12/11/2013   Coronary artery disease involving native coronary artery of native heart with angina pectoris 06/10/2013   Nocturnal hypoxemia 10/24/2012   Hyperlipidemia 05/22/2008   Dyspnea 05/22/2008   NEOPLASM, MALIGNANT, BREAST, HX OF 05/22/2008   PAST MEDICAL HISTORY:  Active Ambulatory Problems    Diagnosis Date Noted   Hyperlipidemia 05/22/2008   Dyspnea 05/22/2008   NEOPLASM, MALIGNANT, BREAST, HX OF 05/22/2008   Nocturnal hypoxemia 10/24/2012   Coronary artery disease involving native coronary artery of native heart with angina pectoris 06/10/2013   Type II diabetes mellitus with complication 01/15/2014   OSA (obstructive sleep apnea) 01/20/2014   COPD GOLD 0 vs asthma 08/26/2014   Venous insufficiency of right lower extremity 08/26/2014   Abnormal echocardiogram 12/11/2013   Chest pain 11/11/2014   Breast cancer, female 11/11/2014   Benign neoplasm of colon 11/11/2014   CAFL (chronic airflow limitation) 11/11/2014   Clinical depression 11/11/2014    Major depressive episode 11/11/2014   Dermatitis, eczematoid 12/11/2013   Diabetes mellitus with peripheral circulatory disorder 11/11/2014   Diabetes mellitus, type 2 (HCC) 11/11/2014   Diabetes mellitus type 2, uncontrolled 11/11/2014   Benign essential HTN 11/11/2014   Combined fat and carbohydrate induced hyperlipemia 11/11/2014   Adiposity 11/11/2014   Bone/cartilage disorder 11/11/2014   Blood clot associated with vein wall inflammation 11/11/2014   Hypercholesterolemia without hypertriglyceridemia 11/11/2014   Peripheral blood vessel disorder (HCC) 11/11/2014   Left foot pain 11/11/2014   Dysesthesia 11/11/2014   Carotid bruit 11/11/2014   Obesity (BMI 30-39.9) 12/20/2014   Acute bronchitis 04/26/2016   Asthma with acute exacerbation 04/26/2016   COPD (chronic obstructive pulmonary disease) 07/08/2016   Breast cancer    Dyspnea on exertion 09/30/2021   Abnormal finding on breast imaging 09/30/2021   Abdominal hernia 09/30/2021   SOB (shortness of breath) 09/30/2021   Acute diastolic CHF (congestive heart failure) 10/01/2021   Hypoglycemia associated with type 2 diabetes mellitus 05/11/2022   Upper respiratory tract infection 05/11/2022   Resolved Ambulatory Problems    Diagnosis Date Noted   Essential hypertension 05/22/2008   Arteriosclerosis of coronary artery 11/11/2014   Past Medical History:  Diagnosis Date   Asthma    Coronary atherosclerosis    Depression    DM (diabetes mellitus) (HCC)    Hypertension    Osteopenia    Personal history of radiation therapy    PVD (peripheral vascular disease) (HCC)       Preferred Pharmacy: ALLERGIES:  Allergies  Allergen Reactions   Niacin Hives and Shortness Of Breath   Penicillins Other (See Comments)    Did it involve swelling of the face/tongue/throat, SOB, or low BP? Y Did it involve sudden or severe rash/hives, skin peeling, or any reaction on the inside of your mouth or nose? UNK Did you need to seek  medical attention at a hospital or doctor's office? N When did it last happen? Can't remember   If all above answers are "NO", may proceed with cephalosporin  use.    Saccharin Palpitations    Allergen: ZOX:WRUEAV Dye+methocarbamol+saccharin (Saccharin)   Clindamycin/Lincomycin Other (See Comments)    Unknown reaction - listed on MAR   Codeine Other (See Comments)    Night-mare   Diclofenac Sodium Hives   Erythromycin Diarrhea    GI upset   Glipizide Other (See Comments)    GI upset   Iodinated Contrast Media Other (See Comments)    Unknown reaction - listed on MAR   Latex Other (See Comments)    Unknown reaction - listed on MAR   Macrolides And Ketolides Other (See Comments)    Unknown reaction - listed on MAR   Metformin And Related Diarrhea   Methocarbamol Other (See Comments)    Tarchycardia   Ramipril Other (See Comments)    Unknown reaction   Sulfa Antibiotics Diarrhea   Trimox [Amoxicillin] Other (See Comments)    GI upset   Tetanus Toxoid Itching, Swelling and Rash     PERTINENT MEDICATIONS:  Outpatient Encounter Medications as of 08/19/2022  Medication Sig   acetaminophen (TYLENOL) 500 MG tablet Take 500 mg by mouth every 12 (twelve) hours as needed (general pain).   albuterol (PROAIR HFA) 108 (90 Base) MCG/ACT inhaler Inhale 1-2 puffs into the lungs every 6 (six) hours as needed for wheezing or shortness of breath. (Patient taking differently: Inhale 2 puffs into the lungs every 4 (four) hours as needed for wheezing or shortness of breath.)   aspirin EC 81 MG tablet Take 81 mg by mouth every morning. Swallow whole.   azelastine (ASTELIN) 0.1 % nasal spray Place 2 sprays into both nostrils 2 (two) times daily as needed for rhinitis.   Cholecalciferol (VITAMIN D) 50 MCG (2000 UT) tablet Take 2,000 Units by mouth every morning.   dapagliflozin propanediol (FARXIGA) 10 MG TABS tablet Take 1 tablet (10 mg total) by mouth daily before breakfast.   ezetimibe (ZETIA) 10 MG  tablet Take 10 mg by mouth every morning.   fluticasone (FLONASE) 50 MCG/ACT nasal spray Place 1 spray into both nostrils daily.   glucose 4 GM chewable tablet Chew 1 tablet by mouth as needed for low blood sugar.   insulin aspart (NOVOLOG) 100 UNIT/ML injection Inject 6-12 Units into the skin See admin instructions. Inject 6-12 units subcutaneously per sliding scale: CBG 100-150  6 units, 151-200 8 units, 201-250 10 units, 251 +  12 units and notify provider   insulin degludec (TRESIBA FLEXTOUCH) 100 UNIT/ML FlexTouch Pen Inject 25-50 Units into the skin See admin instructions. Inject 50 units subcutaneously twice daily, inject 25 units every 12 hours as needed for BS <100   irbesartan (AVAPRO) 75 MG tablet Take 1 tablet (75 mg total) by mouth daily.   isosorbide mononitrate (IMDUR) 60 MG 24 hr tablet TAKE 1 TABLET BY MOUTH ONCE DAILY --PLEASE  KEEP  UPCOMIMG  APPT  FOR  FURTHER  REFILLS   loratadine (CLARITIN) 10 MG tablet Take 10 mg by mouth every morning.   metoprolol succinate (TOPROL-XL) 25 MG 24 hr tablet Take 12.5 mg by mouth daily.   nystatin (MYCOSTATIN/NYSTOP) powder Apply 1 Application topically 2 (two) times daily as needed (itching and redness breast and groin).   rosuvastatin (CRESTOR) 40 MG tablet Take 40 mg by mouth every morning.   sertraline (ZOLOFT) 100 MG tablet Take 100 mg by mouth every morning.   umeclidinium-vilanterol (ANORO ELLIPTA) 62.5-25 MCG/ACT AEPB Inhale 1 puff into the lungs daily.   No facility-administered encounter medications on file as  of 08/19/2022.    History obtained from review of EMR, discussion with facility staff/caregiver and/or patient.   Facility labs remarkable August 26, 2022: Hgb A1c elevated at 7.4%  eGFR 46.78 with elevated Cr 1.16, bun/Cr ratio elevated at 31.9, Cl elevated at 108 TSH normal at 1.23 Lipid profile remarkable for low HDL 39.4, normal LDL-C 40 CBC remarkable for elevate WBC 11.04   I reviewed EMR for available labs,  medications, imaging, studies and related documents. Records reviewed and summarized above.   Physical Exam:  Current Weight 224 on 09/07/22, was 223 in January GENERAL: NAD, obese well developed female LUNGS: CTAB, no increased work of breathing, room air, occasional non-productive cough CARDIAC:  Normal S1S2.  RRR without MRG, no Able to speak in complete sentences, no edema/cyanosis, feet warm ABD:  Central epigastric area ventral hernia, soft, non-tender and easily reducible.  BS + x 4 quads EXTREMITIES: Equal strength in all 4 extremities, no deformity, Ambulates with steady gait with rolling walker INTEG: Warm/Dry, no rashes, bruises or wounds visible  NEURO:  Short term memory deficits PSYCH:  non-anxious affect, A & O x 3  Thank you for the opportunity to participate in the care of [facility]. Please call our main office at [phone] if we can be of additional assistance.    Joycelyn Man FNP-C  [email or web address] .[facility]  Phone:  [phone]

## 2022-10-25 ENCOUNTER — Non-Acute Institutional Stay: Payer: Medicare Other | Admitting: Family Medicine

## 2022-10-25 ENCOUNTER — Encounter: Payer: Self-pay | Admitting: Family Medicine

## 2022-10-25 VITALS — BP 110/42 | HR 77 | Temp 98.3°F | Resp 18

## 2022-10-25 DIAGNOSIS — J449 Chronic obstructive pulmonary disease, unspecified: Secondary | ICD-10-CM

## 2022-10-25 DIAGNOSIS — E11649 Type 2 diabetes mellitus with hypoglycemia without coma: Secondary | ICD-10-CM

## 2022-10-25 DIAGNOSIS — F329 Major depressive disorder, single episode, unspecified: Secondary | ICD-10-CM

## 2022-10-25 DIAGNOSIS — J9601 Acute respiratory failure with hypoxia: Secondary | ICD-10-CM

## 2022-10-25 NOTE — Progress Notes (Signed)
Therapist, nutritional Palliative Care Consult Note Telephone: [phone]  Fax: [phone]   Date of encounter: 10/25/22 4:05 PM PATIENT NAME: [name] [address] [place] [address]   [phone] (home) [phone] (work) DOB: July 21, 1940 MRN: [id number] PRIMARY CARE PROVIDER:    Joycelyn Man, NP,  [address] [place] [address] [phone]  REFERRING PROVIDER:   Joycelyn Man, NP [address] [place],  [place] [address] [phone]  Now care being taken over by Katherine Heman, NP with [facility]  RESPONSIBLE PARTY:    Contact Information       Name Relation Home Work Mobile    [name] Daughter     [phone]    [name]     [phone]    [facility] [place] AND PHONE NUMBER: [name]     CODE STATUS: MOST as of 08/19/22: DNR/DNI with comfort measures DO not transfer to hospital unless comfort needs cannot be met Use of antibiotics and IV fluids on a case by case, time limited basis No feeding tube.         ASSESSMENT AND / RECOMMENDATIONS:  PPS: 60%   Hypoglycemia associated with Diabetes Mellitus   Improved stability with higher lows and lower highs. Change Tresiba to 54 units in am, 22 units QHS, decrease am dose to 48 units in am if blood sugar < or = 100. Continue current SSI Novolog 101-150, 6 units, 151-200, 8 units; 201-250, 10 unit, 251+ 12 units. Continue site rotation to improve absorption. Continue Farxciga, HGB A1c at goal of 7.5% with no hypoglycemia. Advised pt it is crucial if she takes a glucose tab for low blood sugar for her to notify the staff. Refuses specialist follow up.  Chronic respiratory failure with COPD Stable. Occasional hypoxia noted when pt non-compliant with wearing O2.  Wears O2 @ night continuous. Continuous O2 @ 2L [name]. Continue Anoro ellipta for maintenance, flonase and astelin for allergy/PND, and Albuterol for  SOB/DOE.  Major Depressive Disorder, recurrent/remission status unspecified Self reported as improved. Continue Sertraline 100 mg daily. May benefit from bereavement counseling if psych services offered at the facility                                                                                                                                                            Follow up Palliative Care Visit:  Palliative Care continuing to follow up by monitoring for changes in appetite, weight, functional and cognitive status for chronic disease progression and management in agreement with patient's stated goals of care. Next visit in 2 weeks or prn.  This visit was coded based on medical decision making (MDM).  Chief Complaint  Palliative Care is continuing to follow pt for chronic medical management in  setting of chronic respiratory failure with COPD for uncontrolled diabetes mellitus.  HISTORY OF PRESENT ILLNESS: [name] is a 82 y.o. year old female with Diastolic CHF, DM and COPD with recent  multiple episodes of hypoglycemia despite titration of insulin.   Last HGB A1c was 7.4% done at the facility on 08/26/22.  5 recent episodes of hypoglycemia predominantly fasting, the lowest at 52 on 10/06/22.   Before dinner and HS sugars continue to run high in the 170s-220s range over the last 2 weeks, higher the 2 weeks prior to that. Pt has been receiving the reduced units of 45 if blood sugar less than 100.   Pt does not want to follow up with specialists and has requested DNR status.  Daughter gave her email as [name] and was agreeable to receiving post-visit feedback via secure email and urgent updates by phone.  Pt states she has not needed to use her glucose tabs during this time even with the lows she has had. Pt states that she has been in a depression since the loss of her husband last fall but this is beginning to improve some.  She is encouraged for her progress with her blood  sugars and depression and advised that she may still have some bad times around anniversaries for her loss and holidays, special times.    ACTIVITIES OF DAILY LIVING: CONTINENT OF BLADDER-no/BOWEL? Yes BATHING/DRESSING/FEEDING - Requires supervision for safety, independent with feeding  MOBILITY:   AMBULATORY WITH ASSISTIVE DEVICE: Rollator   APPETITE? Good   Weight in May 1, 224 lbs, weight in March 222 lbs  CURRENT PROBLEM LIST:  Patient Active Problem List   Diagnosis Date Noted   Acute respiratory failure with hypoxia (HCC) 08/19/2022   Hypoglycemia associated with type 2 diabetes mellitus (HCC) 05/11/2022   Upper respiratory tract infection 05/11/2022   Acute diastolic CHF (congestive heart failure) (HCC) 10/01/2021   Dyspnea on exertion 09/30/2021   Abnormal finding on breast imaging 09/30/2021   Abdominal hernia 09/30/2021   SOB (shortness of breath) 09/30/2021   Breast cancer (HCC)    COPD (chronic obstructive pulmonary disease) (HCC) 07/08/2016   Acute bronchitis 04/26/2016   Asthma with acute exacerbation 04/26/2016   Obesity (BMI 30-39.9) 12/20/2014   Chest pain 11/11/2014   Breast cancer, female (HCC) 11/11/2014   Benign neoplasm of colon 11/11/2014   CAFL (chronic airflow limitation) (HCC) 11/11/2014   Clinical depression 11/11/2014   Major depressive episode 11/11/2014   Diabetes mellitus with peripheral circulatory disorder (HCC) 11/11/2014   Diabetes mellitus, type 2 (HCC) 11/11/2014   Diabetes mellitus type 2, uncontrolled 11/11/2014   Benign essential HTN 11/11/2014   Combined fat and carbohydrate induced hyperlipemia 11/11/2014   Adiposity 11/11/2014   Bone/cartilage disorder 11/11/2014   Blood clot associated with vein wall inflammation 11/11/2014   Hypercholesterolemia without hypertriglyceridemia 11/11/2014   Peripheral blood vessel disorder (HCC) 11/11/2014   Left foot pain 11/11/2014   Dysesthesia 11/11/2014   Carotid bruit 11/11/2014   COPD  GOLD 0 vs asthma 08/26/2014   Venous insufficiency of right lower extremity 08/26/2014   OSA (obstructive sleep apnea) 01/20/2014   Type II diabetes mellitus with complication (HCC) 01/15/2014   Abnormal echocardiogram 12/11/2013   Dermatitis, eczematoid 12/11/2013   Coronary artery disease involving native coronary artery of native heart with angina pectoris (HCC) 06/10/2013   Nocturnal hypoxemia 10/24/2012   Hyperlipidemia 05/22/2008   Dyspnea 05/22/2008   NEOPLASM, MALIGNANT, BREAST, HX OF 05/22/2008   PAST MEDICAL HISTORY:  Active Ambulatory Problems    Diagnosis Date Noted   Hyperlipidemia 05/22/2008   Dyspnea 05/22/2008   NEOPLASM, MALIGNANT, BREAST, HX OF 05/22/2008   Nocturnal hypoxemia 10/24/2012   Coronary artery disease involving native coronary artery of native heart with angina pectoris (HCC) 06/10/2013   Type II diabetes mellitus with complication (HCC) 01/15/2014   OSA (obstructive sleep apnea) 01/20/2014   COPD GOLD 0 vs asthma 08/26/2014   Venous insufficiency of right lower extremity 08/26/2014   Abnormal echocardiogram 12/11/2013   Chest pain 11/11/2014   Breast cancer, female (HCC) 11/11/2014   Benign neoplasm of colon 11/11/2014   CAFL (chronic airflow limitation) (HCC) 11/11/2014   Clinical depression 11/11/2014   Major depressive episode 11/11/2014   Dermatitis, eczematoid 12/11/2013   Diabetes mellitus with peripheral circulatory disorder (HCC) 11/11/2014   Diabetes mellitus, type 2 (HCC) 11/11/2014   Diabetes mellitus type 2, uncontrolled 11/11/2014   Benign essential HTN 11/11/2014   Combined fat and carbohydrate induced hyperlipemia 11/11/2014   Adiposity 11/11/2014   Bone/cartilage disorder 11/11/2014   Blood clot associated with vein wall inflammation 11/11/2014   Hypercholesterolemia without hypertriglyceridemia 11/11/2014   Peripheral blood vessel disorder (HCC) 11/11/2014   Left foot pain 11/11/2014   Dysesthesia 11/11/2014   Carotid bruit  11/11/2014   Obesity (BMI 30-39.9) 12/20/2014   Acute bronchitis 04/26/2016   Asthma with acute exacerbation 04/26/2016   COPD (chronic obstructive pulmonary disease) (HCC) 07/08/2016   Breast cancer (HCC)    Dyspnea on exertion 09/30/2021   Abnormal finding on breast imaging 09/30/2021   Abdominal hernia 09/30/2021   SOB (shortness of breath) 09/30/2021   Acute diastolic CHF (congestive heart failure) (HCC) 10/01/2021   Hypoglycemia associated with type 2 diabetes mellitus (HCC) 05/11/2022   Upper respiratory tract infection 05/11/2022   Acute respiratory failure with hypoxia (HCC) 08/19/2022   Resolved Ambulatory Problems    Diagnosis Date Noted   Essential hypertension 05/22/2008   Arteriosclerosis of coronary artery 11/11/2014   Past Medical History:  Diagnosis Date   Asthma    Coronary atherosclerosis    Depression    DM (diabetes mellitus) (HCC)    Hypertension    Osteopenia    Personal history of radiation therapy    PVD (peripheral vascular disease) (HCC)       Preferred Pharmacy: ALLERGIES:  Allergies  Allergen Reactions   Niacin Hives and Shortness Of Breath   Penicillins Other (See Comments)    Did it involve swelling of the face/tongue/throat, SOB, or low BP? Y Did it involve sudden or severe rash/hives, skin peeling, or any reaction on the inside of your mouth or nose? UNK Did you need to seek medical attention at a hospital or doctor's office? N When did it last happen? Can't remember   If all above answers are "NO", may proceed with cephalosporin use.    Saccharin Palpitations    Allergen: ZOX:WRUEAV Dye+methocarbamol+saccharin (Saccharin)   Clindamycin/Lincomycin Other (See Comments)    Unknown reaction - listed on MAR   Codeine Other (See Comments)    Night-mare   Diclofenac Sodium Hives   Erythromycin Diarrhea    GI upset   Glipizide Other (See Comments)    GI upset   Iodinated Contrast Media Other (See Comments)    Unknown reaction - listed  on MAR   Latex Other (See Comments)    Unknown reaction - listed on MAR   Macrolides And Ketolides Other (See Comments)    Unknown reaction - listed on [facility]  Metformin And Related Diarrhea   Methocarbamol Other (See Comments)    Tarchycardia   Ramipril Other (See Comments)    Unknown reaction   Sulfa Antibiotics Diarrhea   Trimox [Amoxicillin] Other (See Comments)    GI upset   Tetanus Toxoid Itching, Swelling and Rash     PERTINENT MEDICATIONS:  Outpatient Encounter Medications as of 10/25/2022  Medication Sig   acetaminophen (TYLENOL) 500 MG tablet Take 500 mg by mouth every 12 (twelve) hours as needed (general pain).   albuterol (PROAIR HFA) 108 (90 Base) MCG/ACT inhaler Inhale 1-2 puffs into the lungs every 6 (six) hours as needed for wheezing or shortness of breath. (Patient taking differently: Inhale 2 puffs into the lungs every 4 (four) hours as needed for wheezing or shortness of breath.)   aspirin EC 81 MG tablet Take 81 mg by mouth every morning. Swallow whole.   azelastine (ASTELIN) 0.1 % nasal spray Place 2 sprays into both nostrils 2 (two) times daily as needed for rhinitis.   Cholecalciferol (VITAMIN D) 50 MCG (2000 UT) tablet Take 2,000 Units by mouth every morning.   dapagliflozin propanediol (FARXIGA) 10 MG TABS tablet Take 1 tablet (10 mg total) by mouth daily before breakfast.   ezetimibe (ZETIA) 10 MG tablet Take 10 mg by mouth every morning.   fluticasone (FLONASE) 50 MCG/ACT nasal spray Place 1 spray into both nostrils daily.   glucose 4 GM chewable tablet Chew 1 tablet by mouth as needed for low blood sugar. (Patient not taking: Reported on 08/19/2022)   insulin aspart (NOVOLOG) 100 UNIT/ML injection Inject 6-12 Units into the skin See admin instructions. Inject 6-12 units subcutaneously per sliding scale: CBG 100-150  6 units, 151-200 8 units, 201-250 10 units, 251 +  12 units and notify provider   insulin degludec (TRESIBA FLEXTOUCH) 100 UNIT/ML FlexTouch Pen Inject  22-54 Units into the skin 2 (two) times daily. Inject 54 units in am, decrease to 48 units if blood sugar >/= 100.  Inject  22 units at HS.   irbesartan (AVAPRO) 75 MG tablet Take 1 tablet (75 mg total) by mouth daily.   isosorbide mononitrate (IMDUR) 60 MG 24 hr tablet TAKE 1 TABLET BY MOUTH ONCE DAILY --PLEASE  KEEP  UPCOMIMG  APPT  FOR  FURTHER  REFILLS   loratadine (CLARITIN) 10 MG tablet Take 10 mg by mouth every morning.   metoprolol succinate (TOPROL-XL) 25 MG 24 hr tablet Take 12.5 mg by mouth daily. Hold for HR < 60 BPM   nystatin (MYCOSTATIN/NYSTOP) powder Apply 1 Application topically 2 (two) times daily as needed (itching and redness breast and groin).   rosuvastatin (CRESTOR) 40 MG tablet Take 40 mg by mouth every morning.   sertraline (ZOLOFT) 100 MG tablet Take 100 mg by mouth every morning.   umeclidinium-vilanterol (ANORO ELLIPTA) 62.5-25 MCG/ACT AEPB Inhale 1 puff into the lungs daily.   No facility-administered encounter medications on file as of 10/25/2022.    History obtained from review of EMR, discussion with facility staff/caregiver and/or patient.      I reviewed EMR for available labs, medications, imaging, studies and related documents. Records reviewed and summarized above.   Physical Exam:  Current Weight:  GENERAL: NAD, obese well developed female LUNGS: CTAB, no increased work of breathing, room air CARDIAC:  Normal S1S2.  RRR without MRG, Able to speak in complete sentences, no edema/cyanosis ABD:  Slightly left of central epigastric area ventral hernia, soft, non-tender and easily reducible.  BS + x  4 quads EXTREMITIES: Equal strength in all 4 extremities, no deformity NEURO:  Short term memory deficits PSYCH:  non-anxious affect, A & O x 3  Thank you for the opportunity to participate in the care of [facility]. Please call our main office at [phone] if we can be of additional assistance.    Katherine Man FNP-C   [email or web address]  Phone:  [phone]
